# Patient Record
Sex: Female | Born: 1970
Health system: Southern US, Community
[De-identification: ages and names within clinical notes are randomized; demographics above are authoritative.]

## PROBLEM LIST (undated history)

## (undated) DIAGNOSIS — F329 Major depressive disorder, single episode, unspecified: Secondary | ICD-10-CM

## (undated) DIAGNOSIS — I10 Essential (primary) hypertension: Secondary | ICD-10-CM

## (undated) DIAGNOSIS — G43909 Migraine, unspecified, not intractable, without status migrainosus: Secondary | ICD-10-CM

## (undated) DIAGNOSIS — F32A Depression, unspecified: Secondary | ICD-10-CM

## (undated) HISTORY — DX: Major depressive disorder, single episode, unspecified: F32.9

## (undated) HISTORY — DX: Depression, unspecified: F32.A

## (undated) HISTORY — DX: Migraine, unspecified, not intractable, without status migrainosus: G43.909

## (undated) HISTORY — DX: Essential (primary) hypertension: I10

---

## 1999-04-06 ENCOUNTER — Other Ambulatory Visit: Admission: RE | Admit: 1999-04-06 | Discharge: 1999-04-06 | Payer: Self-pay | Admitting: Internal Medicine

## 2004-05-18 ENCOUNTER — Ambulatory Visit: Payer: Self-pay | Admitting: Internal Medicine

## 2004-09-04 ENCOUNTER — Ambulatory Visit: Payer: Self-pay | Admitting: Internal Medicine

## 2005-09-01 ENCOUNTER — Ambulatory Visit: Payer: Self-pay | Admitting: Internal Medicine

## 2005-10-13 ENCOUNTER — Ambulatory Visit: Payer: Self-pay | Admitting: Internal Medicine

## 2006-09-09 ENCOUNTER — Encounter (INDEPENDENT_AMBULATORY_CARE_PROVIDER_SITE_OTHER): Payer: Self-pay | Admitting: Obstetrics and Gynecology

## 2006-09-09 ENCOUNTER — Ambulatory Visit (HOSPITAL_COMMUNITY): Admission: RE | Admit: 2006-09-09 | Discharge: 2006-09-10 | Payer: Self-pay | Admitting: Obstetrics and Gynecology

## 2007-05-03 ENCOUNTER — Encounter: Admission: RE | Admit: 2007-05-03 | Discharge: 2007-05-03 | Payer: Self-pay | Admitting: Family Medicine

## 2009-02-08 HISTORY — PX: ABDOMINAL HYSTERECTOMY: SHX81

## 2010-06-23 NOTE — Op Note (Signed)
Valerie Gilmore, Valerie Gilmore                ACCOUNT NO.:  [id number]   MEDICAL RECORD NO.:  [id number]          PATIENT TYPE:  AMB   LOCATION:  DAY                          FACILITY:  [facility]   PHYSICIAN:  Crist Fat. Rivard, M.D. DATE OF BIRTH:  03-08-1970   DATE OF PROCEDURE:  09/09/2006  DATE OF DISCHARGE:                               OPERATIVE REPORT   PREOPERATIVE DIAGNOSIS:  Symptomatic uterine fibroids with menorrhagia  and anemia.   POSTOPERATIVE DIAGNOSIS:  Symptomatic uterine fibroids with menorrhagia  and anemia.   ANESTHESIA:  General.   ANESTHESIOLOGIST:  Jenelle Mages. Rica Mast, M.D.   PROCEDURE:  Robotic-assisted total hysterectomy.   SURGEON:  Crist Fat. Rivard, M.D.   ASSISTANTMarquis Lunch. Lowell Guitar, P.A.   ESTIMATED BLOOD LOSS:  50 mL.   PROCEDURE:  After being informed of the planned procedure with possible  complications including bleeding, infection, injury to bowels, bladder  or ureters, need for laparotomy, prolonged time of surgery due to  robotic approach, estimated hospital stay and estimated recovery,  informed consent is obtained.  The patient is taken to OR #10 and given  general anesthesia with endotracheal intubation.  She is placed in a  lithotomy position, prepped and draped in a sterile fashion.  She is on  a sticky mattress, shoulder pads are in place, padded twice, and her  chest is taped to the table.  Knee-high sequential compression devices  are in place.   Pelvic exam reveals an anteverted uterus, bulky, approximately 16 weeks  in size, but mobile and adnexa are normal.  A weighted speculum is  inserted, anterior lip of the cervix is grasped with a tenaculum forceps  and the uterus is sounded at 12 cm.  The cervix was then easily dilated  using Hegar dilator until #25, which allows easy entry of a #10 Rumi  intrauterine manipulator with a vaginal occluder and a 3.5-cm KOH ring.  The balloon of the Rumi is inflated and the KOH ring is sutured to the  cervix with 0 Vicryl.  A Jacobs forceps is placed on the anterior lip of  the cervix for future traction.  The weighted speculum is removed and a  Foley catheter is inserted in her bladder.   We then measure our camera port 15 cm above the fundus of the uterus,  infiltrate this area and perform a vertical 10-mm incision, which was  brought down sharply to the fascia.  The fascia is grasped with a Kocher  forceps, incised and the peritoneum is entered bluntly.  The pursestring  suture of 0 Vicryl is placed on the fascia and a 10-mm Hasson trocar is  easily inserted in the cavity and held in place by the pursestring  suture; this allows [place] to insufflated a pneumoperitoneum with CO2 at a  maximum pressure of 15 mmHg.  A camera is inserted to evaluate the  abdominopelvic cavity and we note a bulky uterus again approximately 16  weeks in size, but mobile.  The patient is status post bilateral tubal  ligation.  Both ovaries are normal.  Anterior and posterior cul-de-sac  are normal, although the bladder is adherent to the uterus almost all  the way to the fundus anteriorly.  The appendix is not visualized, but  liver and gallbladder are visualized and normal.   Trocar placement is then decided and we placed two 8-mm robotic trocars  on the left side and one 8-mm robotic trocar on the right side, as well  as a 10-mm patient-side assistant trocar on the right side, all under  direct visualization after infiltrating with Marcaine 0.25%.   The robot is then docked and a Cobra grasper is inserted in arm #3, PK  forceps in arm #2 and hot scissors in arm #1.  Console time starts at 9  o'clock for a total prepping and docking time docking time of 1 hour.   We proceed with the right side of the uterus, cauterizing the tube and  sectioning it, cauterizing the utero-ovarian ligament and sectioning it,  and cauterizing the round ligament and sectioning it; this gives [place]  gives [place] a very good entry in  the broad ligament with sharply dissecting  the posterior sheath of the broad ligament.  This is brought down all  the way to the uterosacral ligament, the ureter being easily visualized  and kept under direct visualization during this dissection.  Blunt  dissection in the right pelvic wall underneath the anterior sheath of  the broad ligament allows [place] to easily reach the KOH ring and  skeletonize the uterine artery.  The uterus was moved posteriorly and we  can now pay close attention to dissecting the bladder away from the  uterus.  The incision is made very high and we sharply and bluntly  dissect the bladder all the way down to beyond the vaginal fornix,  identified with the KOH ring; this was done fairly easily.  We now have  a direct access to the uterine artery in its ascending branch at the  junction of the vaginal fornix and this is grasped with PK forceps and  cauterized multiple times and sectioned.  We now move to the left side  and proceed in the same fashion by cauterizing the tube, sectioning it,  cauterizing the utero-ovarian ligament and sectioning it, cauterizing  the round ligament and sectioning it.  The broad ligament is then  dissected sharply, first via the posterior sheath of it, again with  keeping the ureter under direct visualization.  We can now do this time  with the anterior sheath of the broad ligament and continue dissecting  the bladder away from that side of the vaginal fornix.  The uterine  artery is then skeletonized completely and easily isolated away from the  ureter and it is grasped with the Gyrus PK forceps and cauterized and  sectioned.  The vaginal occluder is then insufflated and we can enter  the anterior vaginal fornix with monopolar scissors guided by the KOH  ring.  We can now go around in a circumferential way until the uterus is  completely detached.  The uterus is then delivered vaginally, somewhat  easily.   Instruments are then  modified for needle holders and we can close the  vaginal cuff with figure-of-eight stitch of 0 Vicryl.  We irrigate  profusely with warm saline and note a satisfactory hemostasis.  Both  ureters were easily identified with good peristalsis and no dilatation.  For peace of mind, the bladder is then filled with sterile infant  formula and we note an intact bladder with a normal integrity.  The  bladder is then drained.   Two half sheets of Intercede are placed on the vaginal vault as well as  on the broad dissection of the bladder.   The robot is undocked and the trocars are removed after evacuating the  pneumoperitoneum and the fascia of the supraumbilical incision is closed  with the previously-placed pursestring suture of 0 Vicryl.  The fascia  of the right 10-mm trocar is also closed with a figure-of-eight stitch  of 0 Vicryl.   The skin of all incisions is then closed with a subcuticular suture of 3-  0 Monocryl and Steri-Strips.   Instrument and sponge count is complete x2.  Estimated blood loss is 50  mL.  The procedure is very well tolerated by the patient, who is taken  to recovery room in a well and stable condition.   SPECIMEN:  Uterus and cervix weighing 386 g, sent to Pathology.      Crist Fat Rivard, M.D.  Electronically Signed     SAR/MEDQ  D:  09/09/2006  T:  09/10/2006  Job:  [id number]

## 2010-06-23 NOTE — H&P (Signed)
Valerie Gilmore, Valerie Gilmore                ACCOUNT NO.:  [id number]   MEDICAL RECORD NO.:  [id number]          PATIENT TYPE:  AMB   LOCATION:  DAY                          FACILITY:  [facility]   PHYSICIAN:  Valerie Gilmore, M.D. DATE OF BIRTH:  1970/07/23   DATE OF ADMISSION:  09/09/2006  DATE OF DISCHARGE:                              HISTORY & PHYSICAL   HISTORY OF PRESENT ILLNESS:  Valerie Gilmore is a 40 year old single African-  American female, para 2-0-0-2, who presents for a robot assisted  hysterectomy because of symptomatic uterine fibroids, menorrhagia, and a  history of anemia. Since the patient's bilateral tubal ligation in 1996,  she states that her 7 day menstrual flow requires her to use 2 overnight  pads every 30 minutes for 2 days, then every 2 hours for 5 days. She  denies any cramping with this bleeding but reports significant right leg  pain and significant clotting. In the fall of 2007, the patient bled for  an 11 day stretch, during which time she literally sat on the commode  for a day. For the subsequent 5 days, she changed her usual overnight  pad hourly. The remaining 6 days, she changed her pad every 2 hours. The  patient had an ultrasound done at that time by her primary care  physician, Valerie Gilmore and was found to have a uterus measuring  13.1 x 10.7 x 9.1 cm, with a 7.0 x 5.3 x 7.0 cm fibroid. The patient was  also found to be anemic and placed on iron therapy, then referred to the  gynecologist for followup. The patient reported urinary tract symptoms,  constipation (related to the iron) but denied any pelvic pain,  dyspareunia, fever, nausea, vomiting, or diarrhea. As a part of her GYN  evaluation, the patient was given options for management of her uterine  fibroid symptoms (heavy bleeding) to include double Provera, uterine  artery embolization, myomectomy, endometrial ablation, and hysterectomy.  After considering all options, the patient decided on the robot  assisted  hysterectomy. In an effort to improve her hemoglobin and decrease her  uterine size, the patient was placed on Lupron and Depo 11.25 mg on  June 02 2006. This subsequently rendered her amenorrheic after about a  month. A repeat ultrasound on August 18, 2006, showed some decrease in the  patients' uterine size with a uterus measuring 11.89 x 9.49 x 8.60 and  two measurable fibroids, 6.34 cm and 2.12 cm, respectively.   PAST MEDICAL HISTORY/OBSTETRICAL HISTORY:  Gravida 2, para 2-0-0-2. The  patient had a cesarean section in 1993 and a spontaneous vaginal birth  in 1996.   GYNECOLOGIC HISTORY:  Menarche 40 years old. Last menstrual period was  May 2008. The patient uses bilateral tubal ligation as her method of  contraception. Denies any history of sexually transmitted diseases or  abnormal pap smears. Last normal pap smear was November 2007.   PAST MEDICAL HISTORY:  Hypertension, anemia, gastroesophageal reflux  disease.   PAST SURGICAL HISTORY:  In 1996, bilateral tubal ligation. Denies any  problems with anesthesia or histories of blood  transfusion.   FAMILY HISTORY:  Hypertension, breast cancer (paternal grandmother),  diabetes mellitus, and stroke.   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 She does not use alcohol or tobacco.   MEDICATIONS:  Norvasc 10 mg daily, Protonix 40 mg daily, Citracal 500 mg  twice daily, HCTZ 25 mg daily, Repliva 1 tablet daily.   ALLERGIES:  SULFA (causes severe hypertension and hallucinations).   REVIEW OF SYSTEMS:  Denies any chest pain, shortness of breath,  headache, vision changes, dizziness, dyspareunia, hematuria, and except  as otherwise mentioned in the history of present illness, the patients'  review of systems is negative.   PHYSICAL EXAMINATION:  VITAL SIGNS:  Blood pressure 130/90, weight 193,  height 5 feet 7 inches tall.  NECK:  There are no masses, thyromegaly, or cervical  adenopathy.  HEART:  Regular rate and rhythm.  LUNGS:  Clear.  BACK:  No CVA tenderness.  ABDOMEN:  No tenderness or organomegaly. However, there is a firm mass  arising from the pelvis, approximately 3 fingerbreadths above the  synthesis pubis.  EXTREMITIES:  No clubbing, cyanosis, or edema.  PELVIC:  EG BUN within normal limits. Vagina is rugous. Cervix non-  tender without lesions. Uterus appears 14 to 16 weeks size without  tenderness. Adnexa without tenderness or masses. Rectovaginal without  tenderness or masses.   IMPRESSION:  1. Symptomatic uterine fibroids.  2. Menorrhagia.  3. History of anemia.   DISPOSITION:  A discussion was held with the patient regarding the  indications for her procedure, along with its risk, which include but  are not limited to, reaction to anesthesia, damage to adjacent organs,  infection, bleeding, and the possibility that her surgery may have to be  completed using an abdominal incision. Additionally, the patient was  advised that she will have transient postoperative facial edema, that  her procedure will be longer than the traditional open hysterectomy  procedure, and her hospital stay is expected to be 1 to 2 days, that she  should be able to return to work in 2 to 3 weeks. The patient verbalized  understanding of all of these risks.  She was given a MiraLax bowel prep  to be done 1 day prior to her procedure. She has consented to proceed  with a robot assisted hysterectomy at [facility] on  September 09, 2006 at 7:30 a.m.      Valerie Gilmore.      Valerie Gilmore, M.D.  Electronically Signed    EJP/MEDQ  D:  09/02/2006  T:  09/02/2006  Job:  [id number]

## 2010-11-23 LAB — BASIC METABOLIC PANEL
BUN: 5 — ABNORMAL LOW
CO2: 27
Chloride: 105
Creatinine, Ser: 0.68
Glucose, Bld: 117 — ABNORMAL HIGH

## 2010-11-23 LAB — CBC
MCHC: 33.9
MCV: 79
Platelets: 326
Platelets: 383
RDW: 15 — ABNORMAL HIGH
WBC: 3.8 — ABNORMAL LOW
WBC: 9.2

## 2010-11-23 LAB — URINALYSIS, ROUTINE W REFLEX MICROSCOPIC
Leukocytes, UA: NEGATIVE
Nitrite: NEGATIVE
Specific Gravity, Urine: 1.01
Urobilinogen, UA: 0.2
pH: 7.5

## 2010-11-23 LAB — URINE MICROSCOPIC-ADD ON

## 2010-11-23 LAB — PREGNANCY, URINE: Preg Test, Ur: NEGATIVE

## 2012-12-18 ENCOUNTER — Ambulatory Visit
Admission: RE | Admit: 2012-12-18 | Discharge: 2012-12-18 | Disposition: A | Discharge status: Home / Self Care | Payer: Medicaid Other | Source: Ambulatory Visit | Attending: Family Medicine | Admitting: Family Medicine

## 2012-12-18 ENCOUNTER — Other Ambulatory Visit: Payer: Self-pay | Admitting: Family Medicine

## 2012-12-18 DIAGNOSIS — M25551 Pain in right hip: Secondary | ICD-10-CM

## 2012-12-25 ENCOUNTER — Ambulatory Visit
Admission: RE | Admit: 2012-12-25 | Discharge: 2012-12-25 | Disposition: A | Discharge status: Home / Self Care | Payer: Medicaid Other | Source: Ambulatory Visit | Attending: Family Medicine | Admitting: Family Medicine

## 2013-07-25 ENCOUNTER — Other Ambulatory Visit: Payer: Self-pay | Admitting: Family Medicine

## 2013-07-25 DIAGNOSIS — M79605 Pain in left leg: Secondary | ICD-10-CM

## 2013-07-26 ENCOUNTER — Ambulatory Visit
Admission: RE | Admit: 2013-07-26 | Discharge: 2013-07-26 | Disposition: A | Discharge status: Home / Self Care | Payer: Medicaid Other | Source: Ambulatory Visit | Attending: Family Medicine | Admitting: Family Medicine

## 2013-07-26 ENCOUNTER — Other Ambulatory Visit: Payer: Self-pay | Admitting: Family Medicine

## 2013-07-26 DIAGNOSIS — M79605 Pain in left leg: Secondary | ICD-10-CM

## 2013-12-12 ENCOUNTER — Ambulatory Visit (INDEPENDENT_AMBULATORY_CARE_PROVIDER_SITE_OTHER): Payer: BC Managed Care – PPO | Admitting: Family

## 2013-12-12 ENCOUNTER — Telehealth: Payer: Self-pay | Admitting: Family

## 2013-12-12 ENCOUNTER — Encounter: Payer: Self-pay | Admitting: Family

## 2013-12-12 VITALS — BP 120/86 | Temp 98.5°F | Wt 190.4 lb

## 2013-12-12 DIAGNOSIS — G4700 Insomnia, unspecified: Secondary | ICD-10-CM

## 2013-12-12 DIAGNOSIS — Z1239 Encounter for other screening for malignant neoplasm of breast: Secondary | ICD-10-CM

## 2013-12-12 DIAGNOSIS — Z803 Family history of malignant neoplasm of breast: Secondary | ICD-10-CM

## 2013-12-12 DIAGNOSIS — I10 Essential (primary) hypertension: Secondary | ICD-10-CM

## 2013-12-12 DIAGNOSIS — Z1231 Encounter for screening mammogram for malignant neoplasm of breast: Secondary | ICD-10-CM

## 2013-12-12 NOTE — Patient Instructions (Signed)

## 2013-12-12 NOTE — Telephone Encounter (Signed)
Pt would like her daughter to be establish before end of yr

## 2013-12-13 NOTE — Telephone Encounter (Signed)
Ok per Padonda 

## 2013-12-14 ENCOUNTER — Encounter: Payer: Self-pay | Admitting: Family

## 2013-12-14 NOTE — Progress Notes (Signed)
   Subjective:    Patient ID: Valerie Gilmore, female    DOB: 08/29/1970, 43 y.o.   MRN: [id number]  HPI 43 year old African-American female, nonsmoker with a history of hypertension is in today to be established. She sees gynecology and has had a hysterectomy in the past. She has a history of breast cancer in her paternal grandmother. Has not had a mammogram in several years.   Review of Systems  Constitutional: Negative.   HENT: Negative.   Respiratory: Negative.   Cardiovascular: Negative.   Gastrointestinal: Negative.   Endocrine: Negative.   Genitourinary: Negative.   Musculoskeletal: Negative.   Skin: Negative.   Allergic/Immunologic: Negative.   Hematological: Negative.   Psychiatric/Behavioral: Negative.    Past Medical History  Diagnosis Date  . Depression   . Hypertension     high blood pressure readings     History   Social History  . Marital Status: Single    Spouse Name: N/A    Number of Children: N/A  . Years of Education: N/A   Occupational History  . Not on file.   Social History Main Topics  . Smoking status: Not on file  . Smokeless tobacco: Not on file  . Alcohol Use: Not on file  . Drug Use: Not on file  . Sexual Activity: Not on file   Other Topics Concern  . Not on file   Social History Narrative  . No narrative on file    Past Surgical History  Procedure Laterality Date  . Abdominal hysterectomy  2011    Family History  Problem Relation Age of Onset  . Breast cancer Paternal Grandmother   . Prostate cancer Father   . Hypertension      both sides     Allergies not on file  No current outpatient prescriptions on file prior to visit.   No current facility-administered medications on file prior to visit.    BP 120/86 mmHg  Temp(Src) 98.5 F (36.9 C) (Oral)  Wt 190 lb 6.4 oz (86.365 kg)chart    Objective:   Physical Exam  Constitutional: She is oriented to person, place, and time. She appears well-developed and  well-nourished.  Neck: Normal range of motion. Neck supple. No thyromegaly present.  Cardiovascular: Normal rate, regular rhythm and normal heart sounds.   Pulmonary/Chest: Effort normal and breath sounds normal.  Abdominal: Soft. Bowel sounds are normal.  Musculoskeletal: Normal range of motion.  Neurological: She is alert and oriented to person, place, and time.  Skin: Skin is warm and dry.  Psychiatric: She has a normal mood and affect.          Assessment & Plan:  Valerie Gilmore was seen today for establish care.  Diagnoses and associated orders for this visit:  Essential hypertension, benign  Insomnia  Family history of breast cancer in female - MM Digital Screening; Future  Breast cancer screening, high risk patient - MM Digital Screening; Future   call the office with any questions or concerns. Recheck in 3 months for complete physical exam fasting and sooner as needed.

## 2013-12-20 NOTE — Telephone Encounter (Signed)
Pt daughter haas been sch for 01/18/14

## 2014-01-02 ENCOUNTER — Ambulatory Visit: Payer: BC Managed Care – PPO

## 2014-01-09 ENCOUNTER — Ambulatory Visit: Payer: BC Managed Care – PPO

## 2014-01-29 ENCOUNTER — Ambulatory Visit (INDEPENDENT_AMBULATORY_CARE_PROVIDER_SITE_OTHER): Payer: BC Managed Care – PPO | Admitting: Family

## 2014-01-29 ENCOUNTER — Encounter: Payer: Self-pay | Admitting: Family

## 2014-01-29 VITALS — BP 128/90 | HR 101 | Temp 102.0°F | Wt 190.0 lb

## 2014-01-29 DIAGNOSIS — R6889 Other general symptoms and signs: Secondary | ICD-10-CM

## 2014-01-29 DIAGNOSIS — J020 Streptococcal pharyngitis: Secondary | ICD-10-CM

## 2014-01-29 DIAGNOSIS — R300 Dysuria: Secondary | ICD-10-CM

## 2014-01-29 LAB — POCT URINALYSIS DIPSTICK
Bilirubin, UA: NEGATIVE
Glucose, UA: NEGATIVE
KETONES UA: NEGATIVE
LEUKOCYTES UA: NEGATIVE
Nitrite, UA: NEGATIVE
PH UA: 6.5
Spec Grav, UA: 1.02
UROBILINOGEN UA: 0.2

## 2014-01-29 LAB — POCT INFLUENZA A/B
INFLUENZA A, POC: NEGATIVE
INFLUENZA B, POC: NEGATIVE

## 2014-01-29 LAB — POCT RAPID STREP A (OFFICE): Rapid Strep A Screen: POSITIVE — AB

## 2014-01-29 MED ORDER — CEFTRIAXONE SODIUM 1 G IJ SOLR: 1.0000 g | Freq: Once | INTRAMUSCULAR | Status: DC

## 2014-01-29 MED ORDER — AMOXICILLIN 500 MG PO TABS: 1000.0000 mg | ORAL_TABLET | Freq: Two times a day (BID) | ORAL | Status: AC

## 2014-01-29 MED ORDER — CEFTRIAXONE SODIUM 1 G IJ SOLR
1.0000 g | Freq: Once | INTRAMUSCULAR | Status: AC
  Administered 2014-01-29: 1 g via INTRAMUSCULAR

## 2014-01-29 NOTE — Progress Notes (Signed)
Pre visit review using our clinic review tool, if applicable. No additional management support is needed unless otherwise documented below in the visit note. 

## 2014-01-29 NOTE — Patient Instructions (Signed)

## 2014-01-29 NOTE — Progress Notes (Signed)
Subjective:    Patient ID: Valerie Gilmore, female    DOB: 07/10/1970, 43 y.o.   MRN: [id number]  HPI 43 year old African-American female, nonsmoker is in today with complaints of fever, chills, myalgias 1 day and worsening. Has not taken any medication over-the-counter. Did not get a flu shot this year. Also reports pelvic cramping. Denies any vaginal discharge or odor. No frequency or urgency to urinate.   Review of Systems  Constitutional: Positive for fever and chills.  HENT: Positive for congestion and sore throat.   Respiratory: Positive for cough.   Cardiovascular: Negative.   Gastrointestinal: Negative.   Endocrine: Negative.   Genitourinary: Positive for pelvic pain. Negative for dysuria, hematuria, vaginal bleeding, vaginal discharge and vaginal pain.  Musculoskeletal: Positive for myalgias.  Skin: Negative.   Allergic/Immunologic: Negative.   Neurological: Negative.   Psychiatric/Behavioral: Negative.    Past Medical History  Diagnosis Date  . Depression   . Hypertension     high blood pressure readings     History   Social History  . Marital Status: Single    Spouse Name: N/A    Number of Children: N/A  . Years of Education: N/A   Occupational History  . Not on file.   Social History Main Topics  . Smoking status: Not on file  . Smokeless tobacco: Not on file  . Alcohol Use: Not on file  . Drug Use: Not on file  . Sexual Activity: Not on file   Other Topics Concern  . Not on file   Social History Narrative    Past Surgical History  Procedure Laterality Date  . Abdominal hysterectomy  2011    Family History  Problem Relation Age of Onset  . Breast cancer Paternal Grandmother   . Prostate cancer Father   . Hypertension      both sides     No Known Allergies  Current Outpatient Prescriptions on File Prior to Visit  Medication Sig Dispense Refill  . amLODipine (NORVASC) 10 MG tablet Take 10 mg by mouth daily.   3  . cloNIDine (CATAPRES)  0.2 MG tablet Take 0.2 mg by mouth daily.    . hydrochlorothiazide (HYDRODIURIL) 25 MG tablet Take 25 mg by mouth daily.   2  . losartan (COZAAR) 100 MG tablet Take 100 mg by mouth daily.   3   No current facility-administered medications on file prior to visit.    BP 128/90 mmHg  Pulse 101  Temp(Src) 102 F (38.9 C) (Oral)  Wt 190 lb (86.183 kg)chart    Objective:   Physical Exam  Constitutional: She is oriented to person, place, and time. She appears well-developed and well-nourished.  HENT:  Right Ear: External ear normal.  Left Ear: External ear normal.  Nose: Nose normal.  Tonsils 2-3+. Moderate exudate.  Neck: Normal range of motion. Neck supple.  Cardiovascular: Normal rate, regular rhythm and normal heart sounds.   Pulmonary/Chest: Effort normal and breath sounds normal.  Abdominal: Soft. Bowel sounds are normal.  Musculoskeletal: Normal range of motion.  Lymphadenopathy:    She has cervical adenopathy.  Neurological: She is alert and oriented to person, place, and time.  Skin: Skin is warm and dry.  Psychiatric: She has a normal mood and affect.          Assessment & Plan:  Valerie Gilmore was seen today for generalized body aches, fever, sore throat and dysuria.  Diagnoses and associated orders for this visit:  Strep pharyngitis  Flu-like symptoms -  POC Influenza A/B  Dysuria - POCT Urine Dip  Other Orders - cefTRIAXone (ROCEPHIN) 1 G injection; Inject 1 g into the muscle once. - amoxicillin (AMOXIL) 500 MG tablet; Take 2 tablets (1,000 mg total) by mouth 2 (two) times daily.   Alternate ibuprofen and Tylenol. Call the office with any questions or concerns. Change out toothbrush. Recheck as needed.

## 2014-02-12 ENCOUNTER — Encounter: Payer: BC Managed Care – PPO | Admitting: Family

## 2014-05-08 ENCOUNTER — Telehealth: Payer: Self-pay | Admitting: Family

## 2014-05-08 ENCOUNTER — Ambulatory Visit (INDEPENDENT_AMBULATORY_CARE_PROVIDER_SITE_OTHER): Payer: 59 | Admitting: Family

## 2014-05-08 VITALS — BP 150/120 | HR 73 | Temp 98.3°F | Wt 189.7 lb

## 2014-05-08 DIAGNOSIS — F411 Generalized anxiety disorder: Secondary | ICD-10-CM

## 2014-05-08 DIAGNOSIS — F329 Major depressive disorder, single episode, unspecified: Secondary | ICD-10-CM

## 2014-05-08 DIAGNOSIS — I10 Essential (primary) hypertension: Secondary | ICD-10-CM

## 2014-05-08 DIAGNOSIS — F32A Depression, unspecified: Secondary | ICD-10-CM

## 2014-05-08 LAB — COMPREHENSIVE METABOLIC PANEL
ALT: 26 U/L (ref 0–35)
AST: 24 U/L (ref 0–37)
Albumin: 4.4 g/dL (ref 3.5–5.2)
Alkaline Phosphatase: 43 U/L (ref 39–117)
BILIRUBIN TOTAL: 0.4 mg/dL (ref 0.2–1.2)
BUN: 7 mg/dL (ref 6–23)
CHLORIDE: 105 meq/L (ref 96–112)
CO2: 30 meq/L (ref 19–32)
Calcium: 9.5 mg/dL (ref 8.4–10.5)
Creatinine, Ser: 0.77 mg/dL (ref 0.40–1.20)
GFR: 106.08 mL/min (ref >60.00)
GLUCOSE: 83 mg/dL (ref 70–99)
POTASSIUM: 4.1 meq/L (ref 3.5–5.1)
SODIUM: 137 meq/L (ref 135–145)
Total Protein: 7.9 g/dL (ref 6.0–8.3)

## 2014-05-08 MED ORDER — OLMESARTAN-AMLODIPINE-HCTZ 40-5-12.5 MG PO TABS: 1.0000 | ORAL_TABLET | Freq: Every day | ORAL | Status: DC

## 2014-05-08 MED ORDER — BUPROPION HCL ER (XL) 150 MG PO TB24: 150.0000 mg | ORAL_TABLET | Freq: Every day | ORAL | Status: DC

## 2014-05-08 NOTE — Progress Notes (Signed)
Pre visit review using our clinic review tool, if applicable. No additional management support is needed unless otherwise documented below in the visit note. 

## 2014-05-08 NOTE — Patient Instructions (Signed)

## 2014-05-08 NOTE — Progress Notes (Signed)
Subjective:    Patient ID: Valerie Gilmore, female    DOB: 22-Apr-1970, 44 y.o.   MRN: [id number]  HPI 44 y.o. AA female presents today with chief complaint of headache, stress and anxiety. 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 States that she has a headache and right side weakness that she thought was due to her stress, however, she sees that her blood pressure is very high. Pt states that she takes all of her blood pressure medicines daily, except the "fluid pill" because she believes it makes her swell. Pt denies heart palpitations, SOB, chest pain. Denies episodes of crying, fatigue, difficulty waking up in the morning. Denies chills fever and change in appetite.   Past Medical History  Diagnosis Date  . Depression   . Hypertension     high blood pressure readings     History   Social History  . Marital Status: Single    Spouse Name: N/A  . Number of Children: N/A  . Years of Education: N/A   Occupational History  . Not on file.   Social History Main Topics  . Smoking status: Not on file  . Smokeless tobacco: Not on file  . Alcohol Use: Not on file  . Drug Use: Not on file  . Sexual Activity: Not on file   Other Topics Concern  . Not on file   Social History Narrative    Past Surgical History  Procedure Laterality Date  . Abdominal hysterectomy  2011    Family History  Problem Relation Age of Onset  . Breast cancer Paternal Grandmother   . Prostate cancer Father   . Hypertension      both sides     No Known Allergies  Current Outpatient Prescriptions on File Prior to Visit  Medication Sig Dispense Refill  . amLODipine (NORVASC) 10 MG tablet Take 10 mg by mouth daily.   3  . hydrochlorothiazide (HYDRODIURIL) 25 MG tablet Take 25 mg by mouth daily.   2  . losartan (COZAAR) 100 MG tablet Take 100 mg by mouth daily.   3   No current facility-administered medications on file prior to visit.    BP 150/120 mmHg   Pulse 73  Temp(Src) 98.3 F (36.8 C) (Oral)  Wt 189 lb 11.2 oz (86.047 kg)chart  Review of Systems  Constitutional: Negative for fever, chills, activity change, appetite change and fatigue.  Respiratory: Negative.  Negative for cough, chest tightness and shortness of breath.   Cardiovascular: Negative for chest pain.       Acknowledges swelling in bilateral lower extremities.   Neurological: Positive for weakness and headaches. Negative for dizziness and light-headedness.       Acknowledges right side weakness to upper extremity "occasionally".   Psychiatric/Behavioral: The patient is nervous/anxious.        Objective:   Physical Exam  Constitutional: She is oriented to person, place, and time. She appears well-developed and well-nourished. She is active.  Cardiovascular: Normal rate, regular rhythm, normal heart sounds and normal pulses.   Pulmonary/Chest: Effort normal and breath sounds normal.  Abdominal: Soft. Normal appearance and bowel sounds are normal.  Neurological: She is alert and oriented to person, place, and time. She has normal strength and normal reflexes.  Psychiatric: Her speech is normal and behavior is normal. Thought content normal. Her mood appears anxious. She exhibits a depressed mood.          Assessment & Plan:  Valerie Gilmore was seen today for follow-up.  Diagnoses and all orders for this visit:  Essential hypertension Orders: -     CMP  Generalized anxiety disorder  Depression  Other orders -     Olmesartan-Amlodipine-HCTZ (TRIBENZOR) 40-5-12.5 MG TABS; Take 1 tablet by mouth daily. -     buPROPion (WELLBUTRIN XL) 150 MG 24 hr tablet; Take 1 tablet (150 mg total) by mouth daily.  Follow up in four weeks for med recheck and blood pressure recheck.

## 2014-05-08 NOTE — Telephone Encounter (Signed)
emmi emailed °

## 2014-05-09 ENCOUNTER — Telehealth: Payer: Self-pay | Admitting: Family

## 2014-05-09 MED ORDER — OLMESARTAN MEDOXOMIL-HCTZ 40-25 MG PO TABS: 1.0000 | ORAL_TABLET | Freq: Every day | ORAL | Status: DC

## 2014-05-09 MED ORDER — AMLODIPINE BESYLATE 5 MG PO TABS: 5.0000 mg | ORAL_TABLET | Freq: Every day | ORAL | Status: DC

## 2014-05-09 NOTE — Telephone Encounter (Signed)
Pt would like a cb. Pt aware insurance co has been contacted and will await your return call..Marland Kitchen

## 2014-05-09 NOTE — Telephone Encounter (Signed)
Left message to advise pt to call insurance company and ask what they will cover.

## 2014-05-09 NOTE — Telephone Encounter (Signed)
Patient states Olmesartan-Amlodipine-HCTZ (TRIBENZOR) 40-5-12.5 MG TABS will cost her $160 per month and with the discount card it will be $90 per month, it is a Tier 4 medication.  Patient would like to know if she can get another RX.  She said she will be out of her current medication tomorrow.  Her BP this morning when she woke up was 132/107.  [facility] that I will give you to review.

## 2014-05-09 NOTE — Telephone Encounter (Signed)
Per Padonda, Benicar HCT 40/25mg  and amlodipine 5mg .  Scripts sent with note to d/c all other scripts for amlodipine, losartan, hctz, and tribenzor

## 2014-05-09 NOTE — Telephone Encounter (Signed)
I submitted a Tier Exception request for medication. [id number]  I am waiting on a fax back from insurance. Patient is aware of samples and will come pick them up.   FYI: Insurance gave an alternative of Benicar HCT with amlodipine.

## 2014-05-09 NOTE — Telephone Encounter (Signed)
Pt aware and noted that she [place] from [place] letting her know that the Tribenzor was approved for tier 3. Pt will aks pharmacy if Tribenzor is cost effective and if it is not, she will take the Benicar HCT and amlodipine. She call back to let me know

## 2014-05-09 NOTE — Telephone Encounter (Signed)
[employer] submitted the PA and that was approved.  I had to callback and ask again for a Tier Exception request. [id number].  Patient is aware and will wait until we know what the Tier request outcome is.  She is scheduled for ROV on 06/05/14.

## 2014-05-10 NOTE — Telephone Encounter (Signed)
Thanks

## 2014-05-10 NOTE — Telephone Encounter (Signed)
Patient called back and is aware.

## 2014-05-10 NOTE — Telephone Encounter (Signed)
Tier Exception was denied.  LMOM for patient advising her of the out-come.

## 2014-06-05 ENCOUNTER — Ambulatory Visit: Payer: 59 | Admitting: Family

## 2014-06-14 ENCOUNTER — Ambulatory Visit (INDEPENDENT_AMBULATORY_CARE_PROVIDER_SITE_OTHER): Payer: 59 | Admitting: Family

## 2014-06-14 ENCOUNTER — Encounter: Payer: Self-pay | Admitting: Family

## 2014-06-14 VITALS — BP 122/100 | HR 68 | Temp 98.1°F | Wt 188.0 lb

## 2014-06-14 DIAGNOSIS — I10 Essential (primary) hypertension: Secondary | ICD-10-CM

## 2014-06-14 NOTE — Progress Notes (Signed)
   Subjective:    Patient ID: Valerie Gilmore, female    DOB: 09/15/1970, 44 y.o.   MRN: [id number]  HPI 44 year old African-American female, nonsmoker with a long-standing history of uncontrolled hypertension is in today for recheck. She's currently on Tribenzor 40/5/12.5 and tolerating it well. Continues to have diastolic blood pressure readings 90-100. Family history significant for hypertension. Works out 4 times per week. He's a low-sodium diet.   Review of Systems  Constitutional: Negative.   HENT: Negative.   Respiratory: Negative.   Cardiovascular: Negative.   Gastrointestinal: Negative.   Endocrine: Negative.   Genitourinary: Negative.   Musculoskeletal: Negative.   Allergic/Immunologic: Negative.   Neurological: Negative.   Psychiatric/Behavioral: Negative.    Past Medical History  Diagnosis Date  . Depression   . Hypertension     high blood pressure readings     History   Social History  . Marital Status: Single    Spouse Name: N/A  . Number of Children: N/A  . Years of Education: N/A   Occupational History  . Not on file.   Social History Main Topics  . Smoking status: Never Smoker   . Smokeless tobacco: Not on file  . Alcohol Use: No  . Drug Use: No  . Sexual Activity: Not on file   Other Topics Concern  . Not on file   Social History Narrative    Past Surgical History  Procedure Laterality Date  . Abdominal hysterectomy  2011    Family History  Problem Relation Age of Onset  . Breast cancer Paternal Grandmother   . Prostate cancer Father   . Hypertension      both sides     No Known Allergies  Current Outpatient Prescriptions on File Prior to Visit  Medication Sig Dispense Refill  . amLODipine (NORVASC) 5 MG tablet Take 1 tablet (5 mg total) by mouth daily. 90 tablet 0  . buPROPion (WELLBUTRIN XL) 150 MG 24 hr tablet Take 1 tablet (150 mg total) by mouth daily. 30 tablet 1  . olmesartan-hydrochlorothiazide (BENICAR HCT) 40-25 MG per  tablet Take 1 tablet by mouth daily. 90 tablet 0   No current facility-administered medications on file prior to visit.    BP 122/100 mmHg  Pulse 68  Temp(Src) 98.1 F (36.7 C) (Oral)  Wt 188 lb (85.276 kg)chart    Objective:   Physical Exam  Constitutional: She is oriented to person, place, and time. She appears well-developed and well-nourished.  Neck: Normal range of motion. Neck supple.  Cardiovascular: Normal rate, regular rhythm and normal heart sounds.   Pulmonary/Chest: Effort normal and breath sounds normal.  Abdominal: Soft. Bowel sounds are normal.  Musculoskeletal: Normal range of motion.  Neurological: She is alert and oriented to person, place, and time.  Skin: Skin is warm and dry.  Psychiatric: She has a normal mood and affect.          Assessment & Plan:  Valerie Gilmore was seen today for follow-up.  Diagnoses and all orders for this visit:  Uncontrolled hypertension   Advised to start medication Benicar HCT 40/25 and amlodipine 5 mg once daily. We'll see where her blood pressure looks like in 2-3 weeks then make a decision on whether or not we need to add a beta blocker to her current therapy.

## 2014-06-14 NOTE — Progress Notes (Signed)
Pre visit review using our clinic review tool, if applicable. No additional management support is needed unless otherwise documented below in the visit note. 

## 2014-06-14 NOTE — Patient Instructions (Signed)
Fat and Cholesterol Control Diet Fat and cholesterol levels in your blood and organs are influenced by your diet. High levels of fat and cholesterol may lead to diseases of the heart, small and large blood vessels, gallbladder, liver, and pancreas. CONTROLLING FAT AND CHOLESTEROL WITH DIET Although exercise and lifestyle factors are important, your diet is key. That is because certain foods are known to raise cholesterol and others to lower it. The goal is to balance foods for their effect on cholesterol and more importantly, to replace saturated and trans fat with other types of fat, such as monounsaturated fat, polyunsaturated fat, and omega-3 fatty acids. On average, a person should consume no more than 15 to 17 g of saturated fat daily. Saturated and trans fats are considered "bad" fats, and they will raise LDL cholesterol. Saturated fats are primarily found in animal products such as meats, butter, and cream. However, that does not mean you need to give up all your favorite foods. Today, there are good tasting, low-fat, low-cholesterol substitutes for most of the things you like to eat. Choose low-fat or nonfat alternatives. Choose round or loin cuts of red meat. These types of cuts are lowest in fat and cholesterol. Chicken (without the skin), fish, veal, and ground turkey breast are great choices. Eliminate fatty meats, such as hot dogs and salami. Even shellfish have little or no saturated fat. Have a 3 oz (85 g) portion when you eat lean meat, poultry, or fish. Trans fats are also called "partially hydrogenated oils." They are oils that have been scientifically manipulated so that they are solid at room temperature resulting in a longer shelf life and improved taste and texture of foods in which they are added. Trans fats are found in stick margarine, some tub margarines, cookies, crackers, and baked goods.  When baking and cooking, oils are a great substitute for butter. The monounsaturated oils are  especially beneficial since it is believed they lower LDL and raise HDL. The oils you should avoid entirely are saturated tropical oils, such as coconut and palm.  Remember to eat a lot from food groups that are naturally free of saturated and trans fat, including fish, fruit, vegetables, beans, grains (barley, rice, couscous, bulgur wheat), and pasta (without cream sauces).  IDENTIFYING FOODS THAT LOWER FAT AND CHOLESTEROL  Soluble fiber may lower your cholesterol. This type of fiber is found in fruits such as apples, vegetables such as broccoli, potatoes, and carrots, legumes such as beans, peas, and lentils, and grains such as barley. Foods fortified with plant sterols (phytosterol) may also lower cholesterol. You should eat at least 2 g per day of these foods for a cholesterol lowering effect.  Read package labels to identify low-saturated fats, trans fat free, and low-fat foods at the supermarket. Select cheeses that have only 2 to 3 g saturated fat per ounce. Use a heart-healthy tub margarine that is free of trans fats or partially hydrogenated oil. When buying baked goods (cookies, crackers), avoid partially hydrogenated oils. Breads and muffins should be made from whole grains (whole-wheat or whole oat flour, instead of "flour" or "enriched flour"). Buy non-creamy canned soups with reduced salt and no added fats.  FOOD PREPARATION TECHNIQUES  Never deep-fry. If you must fry, either stir-fry, which uses very little fat, or use non-stick cooking sprays. When possible, broil, bake, or roast meats, and steam vegetables. Instead of putting butter or margarine on vegetables, use lemon and herbs, applesauce, and cinnamon (for squash and sweet potatoes). Use nonfat   yogurt, salsa, and low-fat dressings for salads.  LOW-SATURATED FAT / LOW-FAT FOOD SUBSTITUTES Meats / Saturated Fat (g)  Avoid: Steak, marbled (3 oz/85 g) / 11 g  Choose: Steak, lean (3 oz/85 g) / 4 g  Avoid: Hamburger (3 oz/85 g) / 7  g  Choose: Hamburger, lean (3 oz/85 g) / 5 g  Avoid: Ham (3 oz/85 g) / 6 g  Choose: Ham, lean cut (3 oz/85 g) / 2.4 g  Avoid: Chicken, with skin, dark meat (3 oz/85 g) / 4 g  Choose: Chicken, skin removed, dark meat (3 oz/85 g) / 2 g  Avoid: Chicken, with skin, light meat (3 oz/85 g) / 2.5 g  Choose: Chicken, skin removed, light meat (3 oz/85 g) / 1 g Dairy / Saturated Fat (g)  Avoid: Whole milk (1 cup) / 5 g  Choose: Low-fat milk, 2% (1 cup) / 3 g  Choose: Low-fat milk, 1% (1 cup) / 1.5 g  Choose: Skim milk (1 cup) / 0.3 g  Avoid: Hard cheese (1 oz/28 g) / 6 g  Choose: Skim milk cheese (1 oz/28 g) / 2 to 3 g  Avoid: Cottage cheese, 4% fat (1 cup) / 6.5 g  Choose: Low-fat cottage cheese, 1% fat (1 cup) / 1.5 g  Avoid: Ice cream (1 cup) / 9 g  Choose: Sherbet (1 cup) / 2.5 g  Choose: Nonfat frozen yogurt (1 cup) / 0.3 g  Choose: Frozen fruit bar / trace  Avoid: Whipped cream (1 tbs) / 3.5 g  Choose: Nondairy whipped topping (1 tbs) / 1 g Condiments / Saturated Fat (g)  Avoid: Mayonnaise (1 tbs) / 2 g  Choose: Low-fat mayonnaise (1 tbs) / 1 g  Avoid: Butter (1 tbs) / 7 g  Choose: Extra light margarine (1 tbs) / 1 g  Avoid: Coconut oil (1 tbs) / 11.8 g  Choose: Olive oil (1 tbs) / 1.8 g  Choose: Corn oil (1 tbs) / 1.7 g  Choose: Safflower oil (1 tbs) / 1.2 g  Choose: Sunflower oil (1 tbs) / 1.4 g  Choose: Soybean oil (1 tbs) / 2.4 g  Choose: Canola oil (1 tbs) / 1 g Document Released: 01/25/2005 Document Revised: 05/22/2012 Document Reviewed: 04/25/2013 ExitCare Patient Information 2015 [employer]. This information is not intended to replace advice given to you by your health care provider. Make sure you discuss any questions you have with your health care provider.  

## 2014-08-16 ENCOUNTER — Ambulatory Visit (INDEPENDENT_AMBULATORY_CARE_PROVIDER_SITE_OTHER): Payer: 59 | Admitting: Family

## 2014-08-16 ENCOUNTER — Encounter: Payer: Self-pay | Admitting: Family

## 2014-08-16 VITALS — BP 140/94 | Temp 98.9°F | Wt 191.0 lb

## 2014-08-16 DIAGNOSIS — G5602 Carpal tunnel syndrome, left upper limb: Secondary | ICD-10-CM

## 2014-08-16 DIAGNOSIS — G5601 Carpal tunnel syndrome, right upper limb: Secondary | ICD-10-CM

## 2014-08-16 DIAGNOSIS — G5603 Carpal tunnel syndrome, bilateral upper limbs: Secondary | ICD-10-CM

## 2014-08-16 DIAGNOSIS — M79641 Pain in right hand: Secondary | ICD-10-CM

## 2014-08-16 DIAGNOSIS — M79642 Pain in left hand: Secondary | ICD-10-CM

## 2014-08-16 MED ORDER — PREDNISONE 20 MG PO TABS: 60.0000 mg | ORAL_TABLET | Freq: Every day | ORAL | Status: AC

## 2014-08-16 MED ORDER — HYDROCODONE-ACETAMINOPHEN 10-325 MG PO TABS: 1.0000 | ORAL_TABLET | Freq: Three times a day (TID) | ORAL | Status: DC | PRN

## 2014-08-16 MED ORDER — KETOROLAC TROMETHAMINE 60 MG/2ML IM SOLN
60.0000 mg | Freq: Once | INTRAMUSCULAR | Status: AC
  Administered 2014-08-16: 60 mg via INTRAMUSCULAR

## 2014-08-16 MED ORDER — KETOROLAC TROMETHAMINE 60 MG/2ML IM SOLN: 60.0000 mg | Freq: Once | INTRAMUSCULAR | Status: DC

## 2014-08-16 NOTE — Progress Notes (Signed)
Subjective:    Patient ID: Valerie Gilmore, female    DOB: 05-22-1970, 44 y.o.   MRN: [id number]  HPI 44 year old African-American female, nonsmoker with a history of hypertension is in today with complaints of bilateral hand swelling, pain and numbness 3 weeks that has worsened over the last several days. Has been taken bareback and body and Aleve without much relief. Has a history of carpal tunnel syndrome. 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   Review of Systems  Constitutional: Negative.   Respiratory: Negative.   Cardiovascular: Negative.   Gastrointestinal: Negative.   Endocrine: Negative.   Genitourinary: Negative.   Musculoskeletal: Positive for arthralgias.       Bilateral hand pain   Skin: Negative.   Allergic/Immunologic: Negative.   Neurological: Positive for numbness.       Tingling in hands   Psychiatric/Behavioral: Negative.    Past Medical History  Diagnosis Date  . Depression   . Hypertension     high blood pressure readings     History   Social History  . Marital Status: Single    Spouse Name: N/A  . Number of Children: N/A  . Years of Education: N/A   Occupational History  . Not on file.   Social History Main Topics  . Smoking status: Never Smoker   . Smokeless tobacco: Not on file  . Alcohol Use: No  . Drug Use: No  . Sexual Activity: Not on file   Other Topics Concern  . Not on file   Social History Narrative    Past Surgical History  Procedure Laterality Date  . Abdominal hysterectomy  2011    Family History  Problem Relation Age of Onset  . Breast cancer Paternal Grandmother   . Prostate cancer Father   . Hypertension      both sides     No Known Allergies  Current Outpatient Prescriptions on File Prior to Visit  Medication Sig Dispense Refill  . amLODipine (NORVASC) 5 MG tablet Take 1 tablet (5 mg total) by mouth daily. 90 tablet 0  . buPROPion (WELLBUTRIN XL) 150 MG 24 hr tablet Take 1 tablet (150 mg  total) by mouth daily. 30 tablet 1  . olmesartan-hydrochlorothiazide (BENICAR HCT) 40-25 MG per tablet Take 1 tablet by mouth daily. 90 tablet 0   No current facility-administered medications on file prior to visit.    BP 140/94 mmHg  Temp(Src) 98.9 F (37.2 C) (Oral)  Wt 191 lb (86.637 kg)chart    Objective:   Physical Exam  Constitutional: She is oriented to person, place, and time. She appears well-developed and well-nourished.  Neck: Normal range of motion. Neck supple.  Cardiovascular: Normal rate, regular rhythm and normal heart sounds.   Pulmonary/Chest: Effort normal and breath sounds normal.  Abdominal: Soft. Bowel sounds are normal.  Neurological: She is alert and oriented to person, place, and time.  Positive phalen and tinel sign.   Skin: Skin is warm and dry.  Psychiatric: She has a normal mood and affect.          Assessment & Plan:  Valerie Gilmore was seen today for hand pain.  Diagnoses and all orders for this visit:  Bilateral carpal tunnel syndrome Orders: -     ketorolac (TORADOL) injection 60 mg; Inject 2 mLs (60 mg total) into the muscle once. -     ketorolac (TORADOL) injection 60 mg; Inject 2 mLs (60 mg total) into the muscle once.  Bilateral hand pain  Other orders -     predniSONE (DELTASONE) 20 MG tablet; Take 3 tablets (60 mg total) by mouth daily. -     HYDROcodone-acetaminophen (NORCO) 10-325 MG per tablet; Take 1 tablet by mouth every 8 (eight) hours as needed.   Call the office with any questions or concerns. Consider referral to neurology for nerve conduction study if symptoms do not improve. Wrist splints at night.

## 2014-08-16 NOTE — Patient Instructions (Signed)
Carpal Tunnel Syndrome The carpal tunnel is a narrow area located on the palm side of your wrist. The tunnel is formed by the wrist bones and ligaments. Nerves, blood vessels, and tendons pass through the carpal tunnel. Repeated wrist motion or certain diseases may cause swelling within the tunnel. This swelling pinches the main nerve in the wrist (median nerve) and causes the painful hand and arm condition called carpal tunnel syndrome. CAUSES   Repeated wrist motions.  Wrist injuries.  Certain diseases like arthritis, diabetes, alcoholism, hyperthyroidism, and kidney failure.  Obesity.  Pregnancy. SYMPTOMS   A "pins and needles" feeling in your fingers or hand, especially in your thumb, index and middle fingers.  Tingling or numbness in your fingers or hand.  An aching feeling in your entire arm, especially when your wrist and elbow are bent for long periods of time.  Wrist pain that goes up your arm to your shoulder.  Pain that goes down into your palm or fingers.  A weak feeling in your hands. DIAGNOSIS  Your health care provider will take your history and perform a physical exam. An electromyography test may be needed. This test measures electrical signals sent out by your nerves into the muscles. The electrical signals are usually slowed by carpal tunnel syndrome. You may also need X-rays. TREATMENT  Carpal tunnel syndrome may clear up by itself. Your health care provider may recommend a wrist splint or medicine such as a nonsteroidal anti-inflammatory medicine. Cortisone injections may help. Sometimes, surgery may be needed to free the pinched nerve.  HOME CARE INSTRUCTIONS   Take all medicine as directed by your health care provider. Only take over-the-counter or prescription medicines for pain, discomfort, or fever as directed by your health care provider.  If you were given a splint to keep your wrist from bending, wear it as directed. It is important to wear the splint at  night. Wear the splint for as long as you have pain or numbness in your hand, arm, or wrist. This may take 1 to 2 months.  Rest your wrist from any activity that may be causing your pain. If your symptoms are work-related, you may need to talk to your employer about changing to a job that does not require using your wrist.  Put ice on your wrist after long periods of wrist activity.  Put ice in a plastic bag.  Place a towel between your skin and the bag.  Leave the ice on for 15-20 minutes, 03-04 times a day.  Keep all follow-up visits as directed by your health care provider. This includes any orthopedic referrals, physical therapy, and rehabilitation. Any delay in getting necessary care could result in a delay or failure of your condition to heal. SEEK IMMEDIATE MEDICAL CARE IF:   You have new, unexplained symptoms.  Your symptoms get worse and are not helped or controlled with medicines. MAKE SURE YOU:   Understand these instructions.  Will watch your condition.  Will get help right away if you are not doing well or get worse. Document Released: 01/23/2000 Document Revised: 06/11/2013 Document Reviewed: 12/11/2010 ExitCare Patient Information 2015 [employer]. This information is not intended to replace advice given to you by your health care provider. Make sure you discuss any questions you have with your health care provider.  

## 2014-08-16 NOTE — Progress Notes (Signed)
Pre visit review using our clinic review tool, if applicable. No additional management support is needed unless otherwise documented below in the visit note. 

## 2014-09-20 ENCOUNTER — Other Ambulatory Visit: Payer: Self-pay | Admitting: Family

## 2014-11-08 ENCOUNTER — Telehealth: Payer: Self-pay | Admitting: Family

## 2014-11-08 DIAGNOSIS — L309 Dermatitis, unspecified: Secondary | ICD-10-CM

## 2014-11-08 NOTE — Telephone Encounter (Addendum)
Pt needs a referral to see dr Terri Piedra for ?mole on neck, ?ringworm on back of her neck and dermatitis on face`. Pt has seen dr Terri Piedra in past. Dr Terri Piedra office needs a referral sent to their office.

## 2014-11-11 NOTE — Telephone Encounter (Signed)
Referral approved per [facility]. Referral ordered.

## 2014-12-17 ENCOUNTER — Telehealth: Payer: Self-pay | Admitting: Family

## 2014-12-17 MED ORDER — OLMESARTAN MEDOXOMIL-HCTZ 40-25 MG PO TABS: ORAL_TABLET | ORAL | Status: DC

## 2014-12-17 NOTE — Telephone Encounter (Signed)
Rx was sent to the Pharmacy  

## 2014-12-17 NOTE — Telephone Encounter (Signed)
Pt request refill of the following: BENICAR HCT 40-25 MG per tablet, hydrochlorothiazide   Phamacy: [facility] [address]

## 2014-12-26 ENCOUNTER — Other Ambulatory Visit: Payer: Self-pay | Admitting: Family

## 2015-01-08 ENCOUNTER — Ambulatory Visit: Payer: 59 | Admitting: Adult Health

## 2015-01-08 ENCOUNTER — Ambulatory Visit: Payer: 59 | Admitting: Family

## 2015-01-20 ENCOUNTER — Encounter: Payer: Self-pay | Admitting: Adult Health

## 2015-01-20 ENCOUNTER — Ambulatory Visit (INDEPENDENT_AMBULATORY_CARE_PROVIDER_SITE_OTHER): Payer: 59 | Admitting: Adult Health

## 2015-01-20 VITALS — BP 138/100 | Temp 98.7°F | Wt 189.2 lb

## 2015-01-20 DIAGNOSIS — G5603 Carpal tunnel syndrome, bilateral upper limbs: Secondary | ICD-10-CM

## 2015-01-20 DIAGNOSIS — I10 Essential (primary) hypertension: Secondary | ICD-10-CM | POA: Diagnosis not present

## 2015-01-20 DIAGNOSIS — J0100 Acute maxillary sinusitis, unspecified: Secondary | ICD-10-CM | POA: Diagnosis not present

## 2015-01-20 MED ORDER — OLMESARTAN MEDOXOMIL-HCTZ 40-25 MG PO TABS: ORAL_TABLET | ORAL | Status: DC

## 2015-01-20 MED ORDER — AMLODIPINE BESYLATE 10 MG PO TABS: 10.0000 mg | ORAL_TABLET | Freq: Every day | ORAL | Status: DC

## 2015-01-20 NOTE — Progress Notes (Signed)
Pre visit review using our clinic review tool, if applicable. No additional management support is needed unless otherwise documented below in the visit note. 

## 2015-01-20 NOTE — Patient Instructions (Signed)
It was great meeting you today!  I have increased your Norvasc from 5 mg to 10 mg. Take this once a day. Monitor your blood pressure at home and write the results in a log, bring this log to your next visit.   Someone will call you to schedule an appointment for Neurology   Use Sudafed for the next two days as well as Flonase for the sinus infection.

## 2015-01-20 NOTE — Progress Notes (Signed)
   Subjective:    Patient ID: Lillia Dallaseanna K Maggi, female    DOB: 11/24/1970, 44 y.o.   MRN: [id number]  HPI  44 year old female who presents to the office today with multiple complaints.   1) She is here to follow up regarding her blood pressure. She has been taking her medications as directed. Is not monitoring at home. Denies any headaches, blurred vision or dizziness  2) Sinus pressure for the last 3 days. Her sinus pressure is located in the maxillary sinus cavity. Denies any PND, rhinorrhea, or fevers. Her only other symptom is that of left ear itching.  3) She was seen by NP Hyman HopesWebb in July for perceived carpal tunnel syndrome, was given a dose of prednisone, IM shot of Toradol and Norco. Her pain has been well controlled since but over the last week the numbness and tingling in bilateral hands has returned.    Review of Systems  Constitutional: Negative.   HENT: Negative.   Eyes: Negative.   Respiratory: Negative.   Cardiovascular: Negative.   Gastrointestinal: Negative.   Endocrine: Negative.   Genitourinary: Negative.   All other systems reviewed and are negative.      Objective:   Physical Exam  Constitutional: She is oriented to person, place, and time. She appears well-developed and well-nourished. No distress.  HENT:  Head: Normocephalic and atraumatic.  Right Ear: External ear normal.  Left Ear: External ear normal.  Nose: Nose normal.  Mouth/Throat: Oropharynx is clear and moist. No oropharyngeal exudate.  Hypertrophic tonsils TM's visualized. No cerumen impaction. No effusion  Eyes: Conjunctivae and EOM are normal. Pupils are equal, round, and reactive to light. Right eye exhibits no discharge. Left eye exhibits no discharge.  Neck: Normal range of motion. Neck supple.  Cardiovascular: Normal rate, regular rhythm, normal heart sounds and intact distal pulses.  Exam reveals no gallop and no friction rub.   No murmur heard. Pulmonary/Chest: Effort normal and breath  sounds normal. No respiratory distress. She has no wheezes. She has no rales. She exhibits no tenderness.  Musculoskeletal: Normal range of motion. She exhibits no edema.  Lymphadenopathy:    She has cervical adenopathy.  Neurological: She is alert and oriented to person, place, and time.  Skin: Skin is warm and dry. No rash noted. She is not diaphoretic. No erythema. No pallor.  Psychiatric: She has a normal mood and affect. Her behavior is normal. Judgment and thought content normal.  Nursing note and vitals reviewed.      Assessment & Plan:  1. Bilateral carpal tunnel syndrome - Ambulatory referral to Neurology for nerve conduction studies  2. Essential hypertension  - olmesartan-hydrochlorothiazide (BENICAR HCT) 40-25 MG tablet; TAKE ONE TABLET BY MOUTH ONCE DAILY **DISCONTINUE  ALL  SCRIPTS  FOR  LOSARTAN,  HCTZ,  AND  TRIBENZOR**  Dispense: 90 tablet; Refill: 1 - amLODipine (NORVASC) 10 MG tablet; Take 1 tablet (10 mg total) by mouth daily.  Dispense: 90 tablet; Refill: 1 - Monitor blood pressure at home. Bring log to next appointment.  - Follow up in one month 3. Acute maxillary sinusitis, recurrence not specified - Likely Viral in nature.  - FLonase and Sudafed if needed

## 2015-01-21 ENCOUNTER — Other Ambulatory Visit: Payer: Self-pay | Admitting: *Deleted

## 2015-01-21 ENCOUNTER — Other Ambulatory Visit: Payer: Self-pay | Admitting: Adult Health

## 2015-01-21 ENCOUNTER — Telehealth: Payer: Self-pay | Admitting: Family

## 2015-01-21 DIAGNOSIS — G5603 Carpal tunnel syndrome, bilateral upper limbs: Secondary | ICD-10-CM

## 2015-01-21 NOTE — Telephone Encounter (Signed)
error 

## 2015-01-22 ENCOUNTER — Other Ambulatory Visit: Payer: Self-pay | Admitting: Adult Health

## 2015-01-22 MED ORDER — LOSARTAN POTASSIUM 50 MG PO TABS: 50.0000 mg | ORAL_TABLET | Freq: Every day | ORAL | Status: DC

## 2015-01-22 MED ORDER — HYDROCHLOROTHIAZIDE 25 MG PO TABS: 25.0000 mg | ORAL_TABLET | Freq: Every day | ORAL | Status: DC

## 2015-01-23 ENCOUNTER — Telehealth: Payer: Self-pay

## 2015-01-23 NOTE — Telephone Encounter (Signed)
Received a form [employer] stating that Olmesartan Medoxomil/hydrochlorothiazide is not covered by pt's insurance. Per the fax " [employer] cannot perform this prior authorization request because the requested product is a plan exclusion for this member, so there is no coverage criteria to review and apply." Per Kandee Keenory medicaiton changed to Losart 50 mg and HCTZ 25 mg.  This is the same class of medicaiton.   Called and spoke with pt and pt is aware that losartan 50 mg and HCTZ 25 mg were called to the pharmacy and that this will replace the 1 new blood pressure medication Cory called in for pt. Advised pt to call the office if further assistance is needed.

## 2015-02-13 ENCOUNTER — Ambulatory Visit (INDEPENDENT_AMBULATORY_CARE_PROVIDER_SITE_OTHER): Payer: 59 | Admitting: Neurology

## 2015-02-13 DIAGNOSIS — G5603 Carpal tunnel syndrome, bilateral upper limbs: Secondary | ICD-10-CM

## 2015-02-13 NOTE — Procedures (Signed)
[facility]  [address], [address]  [place], [place] [address] Tel: [phone] Fax:  [phone] Test Date:  02/13/2015  Patient: Valerie Gilmore DOB: [birth year] Physician: Nita Sickle, DO  Sex: Female Height: 5\' 7"  Ref Phys: Shirline Frees, M.D.  ID#: [id number] Temp: 33.6C Technician: Judie Petit. Dean   Patient Complaints: This is a 45 year old female referred for evaluation of bilateral hand numbness, tingling, and pain.  NCV & EMG Findings: Extensive electrodiagnostic testing of the right upper extremity and additional studies of the left shows: 1. Bilateral median sensory responses are absent. Bilateral ulnar sensory responses are within normal limits. 2. Bilateral median motor responses shows prolonged distal onset latency (R6.1, L5.8 ms).  Bilateral ulnar motor responses are within normal limits. 3. Sparse chronic motor axon loss changes are seen affecting the right abductor pollicis brevis muscle, without accompanied active denervation.  Impression: Bilateral median neuropathy at or distal to the wrist, consistent with the clinical diagnosis of syndrome. Overall, these findings are moderate to severe in degree electrically, and worse on the right.   ___________________________ Nita Sickle, DO    Nerve Conduction Studies Anti Sensory Summary Table   Site NR Peak (ms) Norm Peak (ms) P-T Amp (V) Norm P-T Amp  Left Median Anti Sensory (2nd Digit)  35.2C  Wrist NR  <3.4  >20  Right Median Anti Sensory (2nd Digit)  35.2C  Wrist NR  <3.4  >20  Left Ulnar Anti Sensory (5th Digit)  35.2C  Wrist    3.0 <3.1 18.5 >12  Right Ulnar Anti Sensory (5th Digit)  35.2C  Wrist    2.8 <3.1 26.4 >12   Motor Summary Table   Site NR Onset (ms) Norm Onset (ms) O-P Amp (mV) Norm O-P Amp Site1 Site2 Delta-0 (ms) Dist (cm) Vel (m/s) Norm Vel (m/s)  Left Median Motor (Abd Poll Brev)  35.2C  Wrist    5.8 <3.9 10.9 >6 Elbow Wrist 4.1 24.0 59 >50  Elbow    9.9  10.6         Right  Median Motor (Abd Poll Brev)  35.2C  Wrist    6.1 <3.9 10.5 >6 Elbow Wrist 4.4 25.0 57 >50  Elbow    10.5  9.6         Left Ulnar Motor (Abd Dig Minimi)  35.2C  Wrist    3.0 <3.1 12.3 >7 B Elbow Wrist 3.6 19.0 53 >50  B Elbow    6.6  11.9  A Elbow B Elbow 1.7 10.0 59 >50  A Elbow    8.3  12.1         Right Ulnar Motor (Abd Dig Minimi)  35.2C  Wrist    2.7 <3.1 11.6 >7 B Elbow Wrist 3.6 21.0 58 >50  B Elbow    6.3  11.6  A Elbow B Elbow 1.5 10.0 67 >50  A Elbow    7.8  11.5          EMG   Side Muscle Ins Act Fibs Psw Fasc Number Recrt Dur Dur. Amp Amp. Poly Poly. Comment  Right 1stDorInt Nml Nml Nml Nml Nml Nml Nml Nml Nml Nml Nml Nml N/A  Right Abd Poll Brev Nml Nml Nml Nml 1- Mod-R Few 1+ Few 1+ Nml Nml N/A  Right Ext Indicis Nml Nml Nml Nml Nml Nml Nml Nml Nml Nml Nml Nml N/A  Right PronatorTeres Nml Nml Nml Nml Nml Nml Nml Nml Nml Nml Nml Nml N/A  Right Biceps Nml Nml Nml Nml Nml Nml Nml Nml Nml Nml Nml Nml N/A  Right Triceps Nml Nml Nml Nml Nml Nml Nml Nml Nml Nml Nml Nml N/A  Right Deltoid Nml Nml Nml Nml Nml Nml Nml Nml Nml Nml Nml Nml N/A  Left Abd Poll Brev Nml Nml Nml Nml Nml Nml Nml Nml Nml Nml Nml Nml N/A  Left PronatorTeres Nml Nml Nml Nml Nml Nml Nml Nml Nml Nml Nml Nml N/A      Waveforms:

## 2015-02-25 ENCOUNTER — Encounter: Payer: 59 | Admitting: Neurology

## 2015-02-28 DIAGNOSIS — H52223 Regular astigmatism, bilateral: Secondary | ICD-10-CM | POA: Diagnosis not present

## 2015-02-28 DIAGNOSIS — H524 Presbyopia: Secondary | ICD-10-CM | POA: Diagnosis not present

## 2015-02-28 DIAGNOSIS — H5213 Myopia, bilateral: Secondary | ICD-10-CM | POA: Diagnosis not present

## 2015-03-11 ENCOUNTER — Telehealth: Payer: Self-pay | Admitting: Adult Health

## 2015-03-11 ENCOUNTER — Ambulatory Visit (INDEPENDENT_AMBULATORY_CARE_PROVIDER_SITE_OTHER): Payer: 59 | Admitting: Adult Health

## 2015-03-11 ENCOUNTER — Encounter: Payer: Self-pay | Admitting: Adult Health

## 2015-03-11 VITALS — BP 150/110 | Temp 98.4°F | Ht 68.0 in | Wt 190.1 lb

## 2015-03-11 DIAGNOSIS — I10 Essential (primary) hypertension: Secondary | ICD-10-CM

## 2015-03-11 DIAGNOSIS — G5603 Carpal tunnel syndrome, bilateral upper limbs: Secondary | ICD-10-CM | POA: Diagnosis not present

## 2015-03-11 MED ORDER — HYDROCHLOROTHIAZIDE 25 MG PO TABS: 25.0000 mg | ORAL_TABLET | Freq: Every day | ORAL | Status: DC

## 2015-03-11 MED ORDER — LOSARTAN POTASSIUM 50 MG PO TABS: 50.0000 mg | ORAL_TABLET | Freq: Every day | ORAL | Status: DC

## 2015-03-11 MED ORDER — AMLODIPINE BESYLATE 10 MG PO TABS: 10.0000 mg | ORAL_TABLET | Freq: Every day | ORAL | Status: DC

## 2015-03-11 MED FILL — LOSARTAN POTASSIUM 50 MG TA: 50 | 90 days supply | Qty: 90 | Fill #0

## 2015-03-11 MED FILL — HYDROCHLOROTHIAZIDE 25 MG T: 25 | 90 days supply | Qty: 90 | Fill #0

## 2015-03-11 MED FILL — AMLODIPINE BESYLATE 10 MG T: 10 | 90 days supply | Qty: 90 | Fill #0

## 2015-03-11 NOTE — Progress Notes (Signed)
Subjective:    Patient ID: Valerie Gilmore, female    DOB: 23-Oct-1970, 45 y.o.   MRN: [id number]  HPI  45 year old female who presents to the office today for follow up regarding her blood pressure. She has been taking HCTZ  and Losartan 50 mg, she has not been taking Norvasc.   She reports that she has stopped drinking sodas and is exercising 5 days a week during her lunch break.   She reports that she is checking her blood pressure at home and they have been running in the 130-140/90's without Norvasc. She did not bring her log into the office today.   She does report that she sometimes has blurred vision and headaches. Has been following up with her eye doctor.   She has had her nerve conduction studies done which showed carpal tunnel that is worse in the right then left.    Review of Systems  Constitutional: Negative.   Eyes: Positive for visual disturbance.  Respiratory: Negative.   Cardiovascular: Negative.   Neurological: Positive for headaches.  All other systems reviewed and are negative.  Past Medical History  Diagnosis Date  . Depression   . Hypertension     high blood pressure readings     Social History   Social History  . Marital Status: Single    Spouse Name: N/A  . Number of Children: N/A  . Years of Education: N/A   Occupational History  . Not on file.   Social History Main Topics  . Smoking status: Never Smoker   . Smokeless tobacco: Not on file  . Alcohol Use: No  . Drug Use: No  . Sexual Activity: Not on file   Other Topics Concern  . Not on file   Social History Narrative    Past Surgical History  Procedure Laterality Date  . Abdominal hysterectomy  2011    Family History  Problem Relation Age of Onset  . Breast cancer Paternal Grandmother   . Prostate cancer Father   . Hypertension      both sides     Allergies  Allergen Reactions  . Other Rash    Powder gloves cause a rash    Current Outpatient Prescriptions on  File Prior to Visit  Medication Sig Dispense Refill  . hydrochlorothiazide (HYDRODIURIL) 25 MG tablet Take 1 tablet (25 mg total) by mouth daily. 90 tablet 3  . losartan (COZAAR) 50 MG tablet Take 1 tablet (50 mg total) by mouth daily. 90 tablet 3  . amLODipine (NORVASC) 10 MG tablet Take 1 tablet (10 mg total) by mouth daily. (Patient not taking: Reported on 03/11/2015) 90 tablet 1   No current facility-administered medications on file prior to visit.    BP 150/110 mmHg  Temp(Src) 98.4 F (36.9 C) (Oral)  Ht  (1.727 m)  Wt 190 lb 1.6 oz (86.229 kg)  BMI 28.91 kg/m2       Objective:   Physical Exam  Constitutional: She is oriented to person, place, and time. She appears well-developed and well-nourished. No distress.  Cardiovascular: Normal rate, regular rhythm, normal heart sounds and intact distal pulses.  Exam reveals no gallop and no friction rub.   No murmur heard. Pulmonary/Chest: Effort normal and breath sounds normal. No respiratory distress. She has no wheezes. She has no rales. She exhibits no tenderness.  Neurological: She is alert and oriented to person, place, and time.  Skin: Skin is warm and dry. No rash noted. She  is not diaphoretic. No erythema. No pallor.  Psychiatric: She has a normal mood and affect. Her behavior is normal. Judgment and thought content normal.  Nursing note and vitals reviewed.     Assessment & Plan:  1. Essential hypertension - Take all medications as directed - Keep log and bring to your next appointment - amLODipine (NORVASC) 10 MG tablet; Take 1 tablet (10 mg total) by mouth daily.  Dispense: 90 tablet; Refill: 2 - hydrochlorothiazide (HYDRODIURIL) 25 MG tablet; Take 1 tablet (25 mg total) by mouth daily.  Dispense: 90 tablet; Refill: 3 - losartan (COZAAR) 50 MG tablet; Take 1 tablet (50 mg total) by mouth daily.  Dispense: 90 tablet; Refill: 3 - Follow up in one month 2. Bilateral carpal tunnel syndrome - We spoke about options  including surgery. She would like to have cortisone injections prior to surgery - Ambulatory referral to Sports Medicine

## 2015-03-11 NOTE — Progress Notes (Signed)
Pre visit review using our clinic review tool, if applicable. No additional management support is needed unless otherwise documented below in the visit note. 

## 2015-03-11 NOTE — Telephone Encounter (Signed)
Pt said [facility] does have generic treibenzor 40-10-25 mg #90 w/refills

## 2015-03-11 NOTE — Patient Instructions (Signed)
It was great seeing you again!  I have sent over a prescription for 90 day supplies of your blood pressure medication.   Continue to monitor at home and bring your log to the next appointment.   Continue to work on diet and exercise.   As discussed, you need to establish with one of the providers, myself, NP Raynelle Fanning and Dr. [place] are all taking new patients.

## 2015-03-12 MED ORDER — OLMESARTAN-AMLODIPINE-HCTZ 40-5-12.5 MG PO TABS: 1.0000 | ORAL_TABLET | Freq: Every day | ORAL | Status: DC

## 2015-03-12 NOTE — Telephone Encounter (Signed)
Rx sent to pharmacy. Discontinued the amlodipine, hctz, and losartan rx.

## 2015-03-12 NOTE — Telephone Encounter (Signed)
Ok to refill 

## 2015-03-18 ENCOUNTER — Telehealth: Payer: Self-pay | Admitting: Adult Health

## 2015-03-18 ENCOUNTER — Ambulatory Visit (INDEPENDENT_AMBULATORY_CARE_PROVIDER_SITE_OTHER): Payer: 59 | Admitting: Adult Health

## 2015-03-18 ENCOUNTER — Encounter: Payer: Self-pay | Admitting: Adult Health

## 2015-03-18 VITALS — BP 138/86 | HR 83 | Temp 98.7°F | Ht 68.0 in | Wt 184.8 lb

## 2015-03-18 DIAGNOSIS — R5383 Other fatigue: Secondary | ICD-10-CM | POA: Diagnosis not present

## 2015-03-18 DIAGNOSIS — K219 Gastro-esophageal reflux disease without esophagitis: Secondary | ICD-10-CM | POA: Diagnosis not present

## 2015-03-18 MED ORDER — LANSOPRAZOLE 15 MG PO CPDR: 15.0000 mg | DELAYED_RELEASE_CAPSULE | Freq: Every day | ORAL | Status: DC

## 2015-03-18 MED ORDER — FLUTICASONE PROPIONATE 50 MCG/ACT NA SUSP: 2.0000 | Freq: Every day | NASAL | Status: DC

## 2015-03-18 MED FILL — FLUTICASONE PROP 50 MCG SPR: 50 | 30 days supply | Qty: 16 | Fill #0

## 2015-03-18 MED FILL — OLMSRTN-AMLDPN-HCTZ 40-5-12: 40-5-12.5 | 90 days supply | Qty: 90 | Fill #0

## 2015-03-18 NOTE — Progress Notes (Signed)
Pre visit review using our clinic review tool, if applicable. No additional management support is needed unless otherwise documented below in the visit note. 

## 2015-03-18 NOTE — Telephone Encounter (Signed)
Pt has an appt with her PCP

## 2015-03-18 NOTE — Progress Notes (Signed)
Subjective:    Patient ID: Valerie Gilmore, female    DOB: [birth year], 45 y.o.   MRN: [id number]  HPI  45 year old female who presents to the office with complaint of generalized weakness, headaches and gassy feeling in her stomach. She reports that when she belches she gets temporary relief. She has not changed her diet. She states " I felt like this a long time ago and I was started on Prevacid, after a few days I felt fine." She denies any chest pain, burning sensation in her throat or waking up with a sour taste in her mouth.     Review of Systems  Constitutional: Positive for fatigue.  Respiratory: Negative.   Cardiovascular: Negative.   Gastrointestinal: Positive for abdominal pain. Negative for nausea, vomiting, diarrhea, constipation and abdominal distention.  Neurological: Positive for headaches.  All other systems reviewed and are negative.  Past Medical History  Diagnosis Date  . Depression   . Hypertension     high blood pressure readings     Social History   Social History  . Marital Status: Single    Spouse Name: N/A  . Number of Children: N/A  . Years of Education: N/A   Occupational History  . Not on file.   Social History Main Topics  . Smoking status: Never Smoker   . Smokeless tobacco: Not on file  . Alcohol Use: No  . Drug Use: No  . Sexual Activity: Not on file   Other Topics Concern  . Not on file   Social History Narrative    Past Surgical History  Procedure Laterality Date  . Abdominal hysterectomy  2011    Family History  Problem Relation Age of Onset  . Breast cancer Paternal Grandmother   . Prostate cancer Father   . Hypertension      both sides     Allergies  Allergen Reactions  . Other Rash    Powder gloves cause a rash    Current Outpatient Prescriptions on File Prior to Visit  Medication Sig Dispense Refill  . amLODipine (NORVASC) 10 MG tablet Take 1 tablet (10 mg total) by mouth daily. 90 tablet 2  .  hydrochlorothiazide (HYDRODIURIL) 25 MG tablet Take 1 tablet (25 mg total) by mouth daily. 90 tablet 3  . losartan (COZAAR) 50 MG tablet Take 1 tablet (50 mg total) by mouth daily. 90 tablet 3  . Olmesartan-Amlodipine-HCTZ (TRIBENZOR) 40-5-12.5 MG TABS Take 1 tablet by mouth daily. (Patient not taking: Reported on 03/18/2015) 90 tablet 3   No current facility-administered medications on file prior to visit.    BP 138/86 mmHg  Pulse 83  Temp(Src) 98.7 F (37.1 C) (Oral)  Ht  (1.727 m)  Wt 184 lb 12.8 oz (83.825 kg)  BMI 28.11 kg/m2  SpO2 97%       Objective:   Physical Exam  Constitutional: She is oriented to person, place, and time. She appears well-developed and well-nourished. No distress.  HENT:  Head: Normocephalic and atraumatic.  Right Ear: External ear normal.  Left Ear: External ear normal.  Nose: Nose normal.  Mouth/Throat: Oropharynx is clear and moist.  Neck: Normal range of motion. Neck supple.  Cardiovascular: Normal rate, regular rhythm, normal heart sounds and intact distal pulses.  Exam reveals no gallop and no friction rub.   No murmur heard. Pulmonary/Chest: Effort normal and breath sounds normal. No respiratory distress. She has no wheezes. She has no rales. She exhibits no tenderness.  Abdominal: Soft. Bowel sounds are normal. She exhibits no distension and no mass. There is no tenderness. There is no rebound and no guarding.  Lymphadenopathy:    She has no cervical adenopathy.  Neurological: She is alert and oriented to person, place, and time.  Skin: Skin is warm and dry. No rash noted. She is not diaphoretic. No erythema. No pallor.  Psychiatric: She has a normal mood and affect. Her behavior is normal. Judgment and thought content normal.  Nursing note and vitals reviewed.     Assessment & Plan:  1. Other fatigue - Likely viral or stress induced. She is not febrile and does not appear acutely ill.   2. Gastroesophageal reflux disease without  esophagitis - lansoprazole (PREVACID) 15 MG capsule; Take 1 capsule (15 mg total) by mouth daily at 12 noon.  Dispense: 90 capsule; Refill: 3

## 2015-03-18 NOTE — Telephone Encounter (Signed)
PLEASE NOTE: All timestamps contained within this report are represented as [place] Standard Time. CONFIDENTIALTY NOTICE: This fax transmission is intended only for the addressee. It contains information that is legally privileged, confidential or otherwise protected from use or disclosure. If you are not the intended recipient, you are strictly prohibited from reviewing, disclosing, copying using or disseminating any of this information or taking any action in reliance on or regarding this information. If you have received this fax in error, please notify us immediately by telephone so that we can arrange for its return to [place]. Phone: [phone], Toll-Free: [phone], Fax: [phone] Page: 1 of 1 Call Id: [id number] [facility] - Client TELEPHONE ADVICE RECORD [facility] Patient Name: Valerie Gilmore DOB: 07/24/1970 Initial Comment Caller states she feels tired, sleepy, weak and irritable, mild chest heaviness and breathing feels more difficult, may be due to starting amlodipine Nurse Assessment Nurse: Elijah Birk, RN, Stark Bray Date/Time (Eastern Time): 03/18/2015 9:57:40 AM Confirm and document reason for call. If symptomatic, describe symptoms. You must click the next button to save text entered. ---Caller states she feels tired, sleepy, weak and irritable, mild chest heaviness and breathing feels more difficult, may be due to starting Amlodipine a week ago. Having blurry vision especially when it is bright. Has the patient traveled out of the country within the last 30 days? ---Not Applicable Does the patient have any new or worsening symptoms? ---Yes Will a triage be completed? ---Yes Related visit to physician within the last 2 weeks? ---Yes Does the PT have any chronic conditions? (i.e. diabetes, asthma, etc.) ---Yes List chronic conditions. ---BP issues Did the patient indicate they were pregnant? ---No Is this a behavioral health or  substance abuse call? ---No Guidelines Guideline Title Affirmed Question Affirmed Notes Breathing Difficulty [1] MILD difficulty breathing (e.g., minimal/no SOB at rest, SOB with walking, pulse <100) AND [2] NEW-onset or WORSE than normal Final Disposition User See Physician within 4 Hours (or PCP triage) Elijah Birk, RN, Stark Bray Comments Referenced Amlodipine on Drugs.com for side effects and there are several symptoms caller is reporting since beginning the rx. Referrals REFERRED TO PCP OFFICE Disagree/Comply: Comply

## 2015-03-19 MED FILL — LANSOPRAZOLE DR 15 MG CAP: 15 | 90 days supply | Qty: 90 | Fill #0

## 2015-03-20 ENCOUNTER — Ambulatory Visit: Payer: 59 | Admitting: Family Medicine

## 2015-04-02 ENCOUNTER — Ambulatory Visit: Payer: 59 | Admitting: Family Medicine

## 2015-05-12 ENCOUNTER — Ambulatory Visit: Payer: 59 | Admitting: Adult Health

## 2015-08-18 MED FILL — OLMSRTN-AMLDPN-HCTZ 40-5-12: 40-5-12.5 | 90 days supply | Qty: 90 | Fill #1

## 2015-08-18 MED FILL — FLUTICASONE PROP 50 MCG SPR: 50 | 30 days supply | Qty: 16 | Fill #1

## 2015-10-08 ENCOUNTER — Ambulatory Visit: Payer: 59 | Admitting: Family Medicine

## 2015-10-29 ENCOUNTER — Encounter: Payer: Self-pay | Admitting: Adult Health

## 2015-10-29 ENCOUNTER — Ambulatory Visit (INDEPENDENT_AMBULATORY_CARE_PROVIDER_SITE_OTHER): Payer: 59 | Admitting: Adult Health

## 2015-10-29 VITALS — BP 138/88 | Temp 98.2°F | Ht 68.0 in | Wt 184.8 lb

## 2015-10-29 DIAGNOSIS — F411 Generalized anxiety disorder: Secondary | ICD-10-CM

## 2015-10-29 LAB — BASIC METABOLIC PANEL
BUN: 14 mg/dL (ref 6–23)
CALCIUM: 9.6 mg/dL (ref 8.4–10.5)
CHLORIDE: 102 meq/L (ref 96–112)
CO2: 28 meq/L (ref 19–32)
Creatinine, Ser: 0.78 mg/dL (ref 0.40–1.20)
GFR: 103.77 mL/min (ref >60.00)
Glucose, Bld: 92 mg/dL (ref 70–99)
Potassium: 4.2 mEq/L (ref 3.5–5.1)
SODIUM: 139 meq/L (ref 135–145)

## 2015-10-29 LAB — CBC
HEMATOCRIT: 43.8 % (ref 36.0–46.0)
Hemoglobin: 14.7 g/dL (ref 12.0–15.0)
MCHC: 33.6 g/dL (ref 30.0–36.0)
MCV: 82.3 fl (ref 78.0–100.0)
PLATELETS: 319 10*3/uL (ref 150.0–400.0)
RBC: 5.32 Mil/uL — AB (ref 3.87–5.11)
RDW: 13.9 % (ref 11.5–15.5)
WBC: 3.6 10*3/uL — AB (ref 4.0–10.5)

## 2015-10-29 LAB — TSH: TSH: 1.18 u[IU]/mL (ref 0.35–4.50)

## 2015-10-29 MED ORDER — CITALOPRAM HYDROBROMIDE 20 MG PO TABS: 20.0000 mg | ORAL_TABLET | Freq: Every day | ORAL | 3 refills | Status: DC

## 2015-10-29 MED FILL — CITALOPRAM HBR 20 MG TABLET: 20 | 30 days supply | Qty: 30 | Fill #0

## 2015-10-29 NOTE — Patient Instructions (Addendum)
It was great seeing you today!  I have sent in a prescription for Celexa. This will help with anxiety   I will follow up with you regarding your labs.   Please let me know if you need anything  Generalized Anxiety Disorder Generalized anxiety disorder (GAD) is a mental disorder. It interferes with life functions, including relationships, work, and school. GAD is different from normal anxiety, which everyone experiences at some point in their lives in response to specific life events and activities. Normal anxiety actually helps us prepare for and get through these life events and activities. Normal anxiety goes away after the event or activity is over.  GAD causes anxiety that is not necessarily related to specific events or activities. It also causes excess anxiety in proportion to specific events or activities. The anxiety associated with GAD is also difficult to control. GAD can vary from mild to severe. People with severe GAD can have intense waves of anxiety with physical symptoms (panic attacks).  SYMPTOMS The anxiety and worry associated with GAD are difficult to control. This anxiety and worry are related to many life events and activities and also occur more days than not for 6 months or longer. People with GAD also have three or more of the following symptoms (one or more in children):  Restlessness.   Fatigue.  Difficulty concentrating.   Irritability.  Muscle tension.  Difficulty sleeping or unsatisfying sleep. DIAGNOSIS GAD is diagnosed through an assessment by your health care provider. Your health care provider will ask you questions aboutyour mood,physical symptoms, and events in your life. Your health care provider may ask you about your medical history and use of alcohol or drugs, including prescription medicines. Your health care provider may also do a physical exam and blood tests. Certain medical conditions and the use of certain substances can cause symptoms  similar to those associated with GAD. Your health care provider may refer you to a mental health specialist for further evaluation. TREATMENT The following therapies are usually used to treat GAD:   Medication. Antidepressant medication usually is prescribed for long-term daily control. Antianxiety medicines may be added in severe cases, especially when panic attacks occur.   Talk therapy (psychotherapy). Certain types of talk therapy can be helpful in treating GAD by providing support, education, and guidance. A form of talk therapy called cognitive behavioral therapy can teach you healthy ways to think about and react to daily life events and activities.  Stress managementtechniques. These include yoga, meditation, and exercise and can be very helpful when they are practiced regularly. A mental health specialist can help determine which treatment is best for you. Some people see improvement with one therapy. However, other people require a combination of therapies.   This information is not intended to replace advice given to you by your health care provider. Make sure you discuss any questions you have with your health care provider.   Document Released: 05/22/2012 Document Revised: 02/15/2014 Document Reviewed: 05/22/2012 Elsevier Interactive Patient Education [employer]2016 Elsevier Inc.

## 2015-10-29 NOTE — Progress Notes (Signed)
Subjective:    Patient ID: Valerie Gilmore, female    DOB: 07/14/1970, 45 y.o.   MRN: [id number]  HPI  45 year old female who presents to the office today for generalized complaints. 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 In addition to this her mother had a stroke two days ago and continues to be in hospital.   She is sleeping 5-6 hours per night.   For the last month she has been experiencing headaches. The headaches are across the front of the head and are described as " a pressure like sensation." She also feels as though she has a tightness in her neck and shoulders. The headaches are intermittent and she does not have them every day   She is also feeling more anxious as of lately. She complains of episodes of feeling as though she is going to have a panic attack, her chest becomes tight and she has shortness of breath along with palpations.     BP Readings from Last 3 Encounters:  03/18/15 138/86  03/11/15 (!) 150/110  01/20/15 (!) 138/100     Review of Systems  Constitutional: Positive for activity change, appetite change and fatigue.  Respiratory: Positive for chest tightness and shortness of breath.   Cardiovascular: Positive for palpitations. Negative for chest pain.  Gastrointestinal: Negative.   Genitourinary: Negative.   Skin: Negative.   Neurological: Positive for headaches. Negative for dizziness, facial asymmetry, light-headedness and numbness.  Psychiatric/Behavioral: Positive for decreased concentration and sleep disturbance. Negative for self-injury and suicidal ideas. The patient is nervous/anxious.   All other systems reviewed and are negative.  Past Medical History:  Diagnosis Date  . Depression   . Hypertension    high blood pressure readings     Social History   Social History  . Marital status:  Single    Spouse name: N/A  . Number of children: N/A  . Years of education: N/A   Occupational History  . Not on file.   Social History Main Topics  . Smoking status: Never Smoker  . Smokeless tobacco: Not on file  . Alcohol use No  . Drug use: No  . Sexual activity: Not on file   Other Topics Concern  . Not on file   Social History Narrative  . No narrative on file    Past Surgical History:  Procedure Laterality Date  . ABDOMINAL HYSTERECTOMY  2011    Family History  Problem Relation Age of Onset  . Breast cancer Paternal Grandmother   . Prostate cancer Father   . Hypertension      both sides     Allergies  Allergen Reactions  . Other Rash    Powder gloves cause a rash    Current Outpatient Prescriptions on File Prior to Visit  Medication Sig Dispense Refill  . amLODipine (NORVASC) 10 MG tablet Take 1 tablet (10 mg total) by mouth daily. 90 tablet 2  . fluticasone (FLONASE) 50 MCG/ACT nasal spray Place 2 sprays into both nostrils daily. 16 g 6  . lansoprazole (PREVACID) 15 MG capsule Take 1 capsule (15 mg total) by mouth daily at 12 noon. 90 capsule 3  . Olmesartan-Amlodipine-HCTZ (TRIBENZOR) 40-5-12.5 MG TABS Take 1 tablet by mouth daily. 90 tablet 3  . losartan (COZAAR) 50 MG tablet Take 1  tablet (50 mg total) by mouth daily. (Patient not taking: Reported on 10/29/2015) 90 tablet 3   No current facility-administered medications on file prior to visit.     BP 138/88   Temp 98.2 F (36.8 C) (Oral)   Ht 5\' 8"  (1.727 m)   Wt 184 lb 12.8 oz (83.8 kg)   BMI 28.10 kg/m       Objective:   Physical Exam  Constitutional: She is oriented to person, place, and time. She appears well-developed and well-nourished. No distress.  HENT:  Head: Normocephalic and atraumatic.  Right Ear: External ear normal.  Left Ear: External ear normal.  Nose: Nose normal.  Mouth/Throat: Oropharynx is clear and moist. No oropharyngeal exudate.  Eyes: Conjunctivae and EOM are  normal. Pupils are equal, round, and reactive to light. Right eye exhibits no discharge. Left eye exhibits no discharge. No scleral icterus.  Neck: Normal range of motion. Neck supple. No JVD present. No tracheal deviation present. No thyromegaly present.  Cardiovascular: Normal rate, regular rhythm, normal heart sounds and intact distal pulses.  Exam reveals no gallop and no friction rub.   No murmur heard. Pulmonary/Chest: Effort normal and breath sounds normal. No respiratory distress. She has no wheezes. She has no rales. She exhibits no tenderness.  Lymphadenopathy:    She has no cervical adenopathy.  Neurological: She is alert and oriented to person, place, and time.  Skin: Skin is warm and dry. No rash noted. She is not diaphoretic. No erythema. No pallor.  Psychiatric: She has a normal mood and affect. Her behavior is normal. Thought content normal.  Nursing note and vitals reviewed.     Assessment & Plan:  1. Generalized anxiety disorder - Her symptoms appear to be from anxiety. I will start her on Celexa 20 mg and have her follow up in 1 month. Continue to eat healthy and exercise.  - Advised to take time for herself daily.  - citalopram (CELEXA) 20 MG tablet; Take 1 tablet (20 mg total) by mouth daily.  Dispense: 30 tablet; Refill: 3 - Basic metabolic panel - CBC - TSH  Shirline Frees, NP

## 2015-12-01 MED FILL — OLMSRTN-AMLDPN-HCTZ 40-5-12: 40-5-12.5 | 90 days supply | Qty: 90 | Fill #2

## 2015-12-01 MED FILL — LANSOPRAZOLE DR 15 MG CAP: 15 | 90 days supply | Qty: 90 | Fill #1

## 2015-12-12 ENCOUNTER — Ambulatory Visit: Payer: 59 | Admitting: Family Medicine

## 2016-02-24 ENCOUNTER — Ambulatory Visit (INDEPENDENT_AMBULATORY_CARE_PROVIDER_SITE_OTHER): Payer: 59 | Admitting: Adult Health

## 2016-02-24 ENCOUNTER — Encounter: Payer: Self-pay | Admitting: Adult Health

## 2016-02-24 VITALS — BP 152/98 | Temp 98.4°F | Ht 68.0 in | Wt 186.8 lb

## 2016-02-24 DIAGNOSIS — R079 Chest pain, unspecified: Secondary | ICD-10-CM | POA: Diagnosis not present

## 2016-02-24 DIAGNOSIS — R1011 Right upper quadrant pain: Secondary | ICD-10-CM | POA: Diagnosis not present

## 2016-02-24 DIAGNOSIS — F411 Generalized anxiety disorder: Secondary | ICD-10-CM | POA: Diagnosis not present

## 2016-02-24 LAB — POCT URINALYSIS DIPSTICK
Bilirubin, UA: NEGATIVE
Glucose, UA: NEGATIVE
KETONES UA: NEGATIVE
Leukocytes, UA: NEGATIVE
Nitrite, UA: NEGATIVE
RBC UA: NEGATIVE
SPEC GRAV UA: 1.025
UROBILINOGEN UA: NEGATIVE
pH, UA: 6

## 2016-02-24 LAB — COMPREHENSIVE METABOLIC PANEL
ALBUMIN: 4.4 g/dL (ref 3.5–5.2)
ALK PHOS: 35 U/L — AB (ref 39–117)
ALT: 24 U/L (ref 0–35)
AST: 22 U/L (ref 0–37)
BILIRUBIN TOTAL: 0.3 mg/dL (ref 0.2–1.2)
BUN: 12 mg/dL (ref 6–23)
CALCIUM: 10.1 mg/dL (ref 8.4–10.5)
CO2: 31 mEq/L (ref 19–32)
Chloride: 103 mEq/L (ref 96–112)
Creatinine, Ser: 0.9 mg/dL (ref 0.40–1.20)
GFR: 87.84 mL/min (ref >60.00)
GLUCOSE: 113 mg/dL — AB (ref 70–99)
POTASSIUM: 4.1 meq/L (ref 3.5–5.1)
Sodium: 141 mEq/L (ref 135–145)
TOTAL PROTEIN: 7.4 g/dL (ref 6.0–8.3)

## 2016-02-24 LAB — CBC WITH DIFFERENTIAL/PLATELET
BASOS ABS: 0 10*3/uL (ref 0.0–0.1)
Basophils Relative: 0.4 % (ref 0.0–3.0)
Eosinophils Absolute: 0 10*3/uL (ref 0.0–0.7)
Eosinophils Relative: 1.1 % (ref 0.0–5.0)
HCT: 42.7 % (ref 36.0–46.0)
HEMOGLOBIN: 14.4 g/dL (ref 12.0–15.0)
LYMPHS ABS: 1.9 10*3/uL (ref 0.7–4.0)
LYMPHS PCT: 55.9 % — AB (ref 12.0–46.0)
MCHC: 33.8 g/dL (ref 30.0–36.0)
MCV: 83.3 fl (ref 78.0–100.0)
MONOS PCT: 12.1 % — AB (ref 3.0–12.0)
Monocytes Absolute: 0.4 10*3/uL (ref 0.1–1.0)
NEUTROS PCT: 30.5 % — AB (ref 43.0–77.0)
Neutro Abs: 1 10*3/uL — ABNORMAL LOW (ref 1.4–7.7)
Platelets: 322 10*3/uL (ref 150.0–400.0)
RBC: 5.12 Mil/uL — AB (ref 3.87–5.11)
RDW: 14.1 % (ref 11.5–15.5)
WBC: 3.4 10*3/uL — ABNORMAL LOW (ref 4.0–10.5)

## 2016-02-24 LAB — LIPASE: LIPASE: 9 U/L — AB (ref 11.0–59.0)

## 2016-02-24 LAB — H. PYLORI ANTIBODY, IGG: H Pylori IgG: NEGATIVE

## 2016-02-24 MED ORDER — SERTRALINE HCL 50 MG PO TABS: 50.0000 mg | ORAL_TABLET | Freq: Every day | ORAL | 3 refills | Status: DC

## 2016-02-24 NOTE — Patient Instructions (Signed)
It was great seeing you today .    I think most of your symptoms are from stress/anxiety   I will follow up with you regarding your blood work.  I have switched your Celexa to Zoloft, take this daily.   Follow up with me in one month

## 2016-02-24 NOTE — Progress Notes (Signed)
Subjective:    Patient ID: Valerie Gilmore, female    DOB: [birth year], 46 y.o.   MRN: [id number]  HPI 46 year old female who presents to the office today for an acute issue. She reports that over the last two weeks she has been experiencing a sharp intermittent pain in her RUQ that radiates under her right breast in between her shoulder blades. She does not notice any difference with this pain with different foods in her diet.   She has also been experiencing intermittent episodes of anxiety with palpitations and chest pain. 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 I saw her in September for anxiety and started her on Celexa. She reports that she took this for about two weeks but quit due to it making her have a headache and and feeling nauseated.   She denies pain radiating to her jaw or down her left arm.     Review of Systems  Constitutional: Negative.   HENT: Negative.   Respiratory: Negative.   Cardiovascular: Positive for chest pain and palpitations.  Gastrointestinal: Positive for abdominal pain and nausea. Negative for blood in stool, constipation, diarrhea, rectal pain and vomiting.  Genitourinary: Negative.   Musculoskeletal: Negative.   Neurological: Positive for headaches.  Hematological: Negative.   Psychiatric/Behavioral: The patient is nervous/anxious.   All other systems reviewed and are negative.  Past Medical History:  Diagnosis Date  . Depression   . Hypertension    high blood pressure readings     Social History   Social History  . Marital status: Single    Spouse name: N/A  . Number of children: N/A  . Years of education: N/A   Occupational History  . Not on file.   Social History Main Topics  . Smoking status: Never Smoker  . Smokeless tobacco: Not on file  . Alcohol use No  . Drug use: No  . Sexual activity: Not on file   Other Topics Concern  . Not on file    Social History Narrative  . No narrative on file    Past Surgical History:  Procedure Laterality Date  . ABDOMINAL HYSTERECTOMY  2011    Family History  Problem Relation Age of Onset  . Breast cancer Paternal Grandmother   . Prostate cancer Father   . Hypertension      both sides     Allergies  Allergen Reactions  . Other Rash    Powder gloves cause a rash    Current Outpatient Prescriptions on File Prior to Visit  Medication Sig Dispense Refill  . amLODipine (NORVASC) 10 MG tablet Take 1 tablet (10 mg total) by mouth daily. 90 tablet 2  . citalopram (CELEXA) 20 MG tablet Take 1 tablet (20 mg total) by mouth daily. 30 tablet 3  . fluticasone (FLONASE) 50 MCG/ACT nasal spray Place 2 sprays into both nostrils daily. 16 g 6  . lansoprazole (PREVACID) 15 MG capsule Take 1 capsule (15 mg total) by mouth daily at 12 noon. 90 capsule 3  . losartan (COZAAR) 50 MG tablet Take 1 tablet (50 mg total) by mouth daily. 90 tablet 3  . Olmesartan-Amlodipine-HCTZ (TRIBENZOR) 40-5-12.5 MG TABS Take 1 tablet by mouth daily. 90 tablet 3   No current facility-administered medications on file prior to visit.     BP (!) 152/98   Temp 98.4 F (36.9 C) (Oral)   Ht 5\' 8"  (  1.727 m)   Wt 186 lb 12.8 oz (84.7 kg)   BMI 28.40 kg/m       Objective:   Physical Exam  Constitutional: She is oriented to person, place, and time. She appears well-developed and well-nourished. No distress.  HENT:  Head: Normocephalic and atraumatic.  Right Ear: External ear normal.  Left Ear: External ear normal.  Nose: Nose normal.  Mouth/Throat: Oropharynx is clear and moist. No oropharyngeal exudate.  Eyes: Conjunctivae and EOM are normal. Pupils are equal, round, and reactive to light. Right eye exhibits no discharge. Left eye exhibits no discharge. No scleral icterus.  Neck: Normal range of motion. Neck supple. No tracheal deviation present. No thyromegaly present.  Cardiovascular: Normal rate, regular  rhythm, normal heart sounds and intact distal pulses.  Exam reveals no gallop and no friction rub.   No murmur heard. Pulmonary/Chest: Effort normal and breath sounds normal. No respiratory distress. She has no wheezes. She has no rales. She exhibits no tenderness.  Abdominal: Soft. Normal appearance and bowel sounds are normal. She exhibits no distension and no mass. There is no hepatosplenomegaly, splenomegaly or hepatomegaly. There is tenderness in the right upper quadrant. There is no rigidity, no rebound, no guarding, no CVA tenderness, no tenderness at McBurney's point and negative Murphy's sign. No hernia.  Musculoskeletal: Normal range of motion.  Lymphadenopathy:    She has no cervical adenopathy.  Neurological: She is alert and oriented to person, place, and time. No cranial nerve deficit. Coordination normal.  Skin: Skin is warm and dry. No rash noted. No erythema. No pallor.  Psychiatric: She has a normal mood and affect. Her behavior is normal. Judgment and thought content normal.  Nursing note and vitals reviewed.     Assessment & Plan:  1. Chest pain, unspecified type - Likely from anxiety or referred pain from gallbladder disease.  - EKG 12-Lead - CBC with Differential/Platelet - Comprehensive metabolic panel - H. pylori antibody, IgG - Lipase - US Abdomen Limited RUQ; Future  2. RUQ pain - Possible gallbladder disease.  - EKG 12-Lead- NSR 0 Rate 69  - sertraline (ZOLOFT) 50 MG tablet; Take 1 tablet (50 mg total) by mouth daily.  Dispense: 30 tablet; Refill: 3 - CBC with Differential/Platelet - Comprehensive metabolic panel - H. pylori antibody, IgG - Lipase - US Abdomen Limited RUQ; Future - POC Urinalysis Dipstick  3. Generalized anxiety disorder - d/c celexa  - sertraline (ZOLOFT) 50 MG tablet; Take 1 tablet (50 mg total) by mouth daily.  Dispense: 30 tablet; Refill: 3 - Follow up in one month or sooner if needed  Shirline Freesory Rickia [place], NP

## 2016-03-15 ENCOUNTER — Other Ambulatory Visit: Payer: Self-pay | Admitting: Adult Health

## 2016-03-16 MED FILL — OLMSRTN-AMLDPN-HCTZ 40-5-12: 40-5-12.5 | 90 days supply | Qty: 90 | Fill #0

## 2016-04-15 ENCOUNTER — Ambulatory Visit (INDEPENDENT_AMBULATORY_CARE_PROVIDER_SITE_OTHER): Payer: 59 | Admitting: Adult Health

## 2016-04-15 VITALS — BP 132/84 | Temp 98.0°F | Ht 68.0 in | Wt 185.4 lb

## 2016-04-15 DIAGNOSIS — J0100 Acute maxillary sinusitis, unspecified: Secondary | ICD-10-CM | POA: Diagnosis not present

## 2016-04-15 MED ORDER — PREDNISONE 20 MG PO TABS: 20.0000 mg | ORAL_TABLET | Freq: Every day | ORAL | 0 refills | Status: DC

## 2016-04-15 MED ORDER — DOXYCYCLINE HYCLATE 100 MG PO CAPS: 100.0000 mg | ORAL_CAPSULE | Freq: Two times a day (BID) | ORAL | 0 refills | Status: DC

## 2016-04-15 MED ORDER — FLUTICASONE PROPIONATE 50 MCG/ACT NA SUSP: 2.0000 | Freq: Every day | NASAL | 6 refills | Status: DC

## 2016-04-15 MED FILL — FLUTICASONE PROP 50 MCG SPR: 50 | 30 days supply | Qty: 16 | Fill #0

## 2016-04-15 MED FILL — predniSONE 20 MG TABS: 20 | 7 days supply | Qty: 7 | Fill #0

## 2016-04-15 MED FILL — DOXYCYCLINE HYCLATE 100 MG: 100 | 7 days supply | Qty: 14 | Fill #0

## 2016-04-15 NOTE — Progress Notes (Signed)
Subjective:    Patient ID: Valerie Gilmore, female    DOB: 03/22/1970, 46 y.o.   MRN: [id number]  Headache   This is a new problem. The current episode started 1 to 4 weeks ago (3 weeks ). The problem occurs intermittently. The pain is located in the right unilateral and temporal region. The pain does not radiate. The pain quality is not similar to prior headaches. The quality of the pain is described as dull. The pain is at a severity of 5/10. Associated symptoms include ear pain, eye watering, a loss of balance, numbness, rhinorrhea and sinus pressure. Pertinent negatives include no blurred vision, coughing, dizziness, drainage, eye pain, eye redness, fever, nausea, phonophobia, photophobia, scalp tenderness, sore throat, tinnitus or visual change.      Review of Systems  Constitutional: Negative.  Negative for fever.  HENT: Positive for ear pain, rhinorrhea and sinus pressure. Negative for congestion, ear discharge, sinus pain, sore throat and tinnitus.   Eyes: Negative for blurred vision, photophobia, pain and redness.  Respiratory: Negative.  Negative for cough.   Cardiovascular: Negative.   Gastrointestinal: Negative.  Negative for nausea.  Neurological: Positive for numbness, headaches and loss of balance. Negative for dizziness.   Past Medical History:  Diagnosis Date  . Depression   . Hypertension    high blood pressure readings     Social History   Social History  . Marital status: Single    Spouse name: N/A  . Number of children: N/A  . Years of education: N/A   Occupational History  . Not on file.   Social History Main Topics  . Smoking status: Never Smoker  . Smokeless tobacco: Not on file  . Alcohol use No  . Drug use: No  . Sexual activity: Not on file   Other Topics Concern  . Not on file   Social History Narrative  . No narrative on file    Past Surgical History:  Procedure Laterality Date  . ABDOMINAL HYSTERECTOMY  2011    Family History    Problem Relation Age of Onset  . Breast cancer Paternal Grandmother   . Prostate cancer Father   . Hypertension      both sides     Allergies  Allergen Reactions  . Other Rash    Powder gloves cause a rash    Current Outpatient Prescriptions on File Prior to Visit  Medication Sig Dispense Refill  . amLODipine (NORVASC) 10 MG tablet Take 1 tablet (10 mg total) by mouth daily. 90 tablet 2  . lansoprazole (PREVACID) 15 MG capsule Take 1 capsule (15 mg total) by mouth daily at 12 noon. 90 capsule 3  . losartan (COZAAR) 50 MG tablet Take 1 tablet (50 mg total) by mouth daily. 90 tablet 3  . Olmesartan-Amlodipine-HCTZ 40-5-12.5 MG TABS TAKE 1 TABLET BY MOUTH ONCE DAILY 90 tablet 1  . sertraline (ZOLOFT) 50 MG tablet Take 1 tablet (50 mg total) by mouth daily. 30 tablet 3   No current facility-administered medications on file prior to visit.     BP 132/84   Temp 98 F (36.7 C) (Oral)   Ht 5\' 8"  (1.727 m)   Wt 185 lb 6.4 oz (84.1 kg)   BMI 28.19 kg/m       Objective:   Physical Exam  Constitutional: She is oriented to person, place, and time. She appears well-developed and well-nourished. No distress.  HENT:  Head: Normocephalic and atraumatic.  Right Ear: Hearing, external ear  and ear canal normal. Tympanic membrane is bulging. Tympanic membrane is not erythematous.  Left Ear: Hearing, tympanic membrane, external ear and ear canal normal. Tympanic membrane is not erythematous and not bulging.  Nose: No mucosal edema or rhinorrhea. Right sinus exhibits maxillary sinus tenderness. Right sinus exhibits no frontal sinus tenderness. Left sinus exhibits no maxillary sinus tenderness and no frontal sinus tenderness.  Mouth/Throat: Uvula is midline, oropharynx is clear and moist and mucous membranes are normal. No oropharyngeal exudate.  Neck: Trachea normal. Carotid bruit is not present. No thyroid mass and no thyromegaly present.  Cardiovascular: Normal rate, regular rhythm, normal  heart sounds and intact distal pulses.  Exam reveals no gallop and no friction rub.   No murmur heard. Pulmonary/Chest: Effort normal and breath sounds normal. No respiratory distress. She has no wheezes. She has no rales. She exhibits no tenderness.  Neurological: She is alert and oriented to person, place, and time.  Skin: Skin is warm and dry. No rash noted. She is not diaphoretic. No erythema. No pallor.  Psychiatric: She has a normal mood and affect. Her behavior is normal. Judgment and thought content normal.  Nursing note and vitals reviewed.     Assessment & Plan:  1. Acute maxillary sinusitis, recurrence not specified - Exam consistent with sinusitis causing her headaches.  - doxycycline (VIBRAMYCIN) 100 MG capsule; Take 1 capsule (100 mg total) by mouth 2 (two) times daily.  Dispense: 14 capsule; Refill: 0 - predniSONE (DELTASONE) 20 MG tablet; Take 1 tablet (20 mg total) by mouth daily with breakfast.  Dispense: 7 tablet; Refill: 0 - fluticasone (FLONASE) 50 MCG/ACT nasal spray; Place 2 sprays into both nostrils daily.  Dispense: 16 g; Refill: 6 - Follow up if no improvement   Shirline Frees, NP

## 2016-04-26 DIAGNOSIS — H02825 Cysts of left lower eyelid: Secondary | ICD-10-CM | POA: Diagnosis not present

## 2016-04-26 DIAGNOSIS — H524 Presbyopia: Secondary | ICD-10-CM | POA: Diagnosis not present

## 2016-04-26 DIAGNOSIS — H52223 Regular astigmatism, bilateral: Secondary | ICD-10-CM | POA: Diagnosis not present

## 2016-04-26 DIAGNOSIS — H5213 Myopia, bilateral: Secondary | ICD-10-CM | POA: Diagnosis not present

## 2016-06-11 MED FILL — OLMSRTN-AMLDPN-HCTZ 40-5-12: 40-5-12.5 | 90 days supply | Qty: 90 | Fill #1

## 2016-07-08 ENCOUNTER — Telehealth: Payer: Self-pay | Admitting: Adult Health

## 2016-07-08 NOTE — Telephone Encounter (Signed)
Patient will need to be evaluated.  Would you mind scheduling patient? Thank you!

## 2016-07-08 NOTE — Telephone Encounter (Signed)
She saw Padonda for this back in July 2016. I would have to reevaluate her before more steroids could be given

## 2016-07-08 NOTE — Telephone Encounter (Signed)
° ° °  Pt has another flair up with her hands and is asking if Kandee KeenCory can call her in some prednisone. t is the same flair up she had about 6 months ago    Ph[facility]

## 2016-07-08 NOTE — Telephone Encounter (Signed)
See below and advise

## 2016-07-09 ENCOUNTER — Encounter: Payer: Self-pay | Admitting: Family Medicine

## 2016-07-09 ENCOUNTER — Ambulatory Visit (INDEPENDENT_AMBULATORY_CARE_PROVIDER_SITE_OTHER): Payer: 59 | Admitting: Family Medicine

## 2016-07-09 VITALS — BP 140/90 | HR 80 | Temp 98.7°F | Ht 68.0 in | Wt 192.3 lb

## 2016-07-09 DIAGNOSIS — I10 Essential (primary) hypertension: Secondary | ICD-10-CM | POA: Diagnosis not present

## 2016-07-09 DIAGNOSIS — G5603 Carpal tunnel syndrome, bilateral upper limbs: Secondary | ICD-10-CM | POA: Diagnosis not present

## 2016-07-09 DIAGNOSIS — R030 Elevated blood-pressure reading, without diagnosis of hypertension: Secondary | ICD-10-CM | POA: Diagnosis not present

## 2016-07-09 MED ORDER — OLMESARTAN-AMLODIPINE-HCTZ 40-5-25 MG PO TABS: 1.0000 | ORAL_TABLET | Freq: Every day | ORAL | 0 refills | Status: DC

## 2016-07-09 MED FILL — OLMSRTN-AMLDPN-HCTZ 40-5-25: 40-5-25 | 30 days supply | Qty: 30 | Fill #0

## 2016-07-09 NOTE — Telephone Encounter (Signed)
I will route to Cory as FYI.  

## 2016-07-09 NOTE — Progress Notes (Signed)
HPI:  Acute visit for several issues:  Bilateral hand/wrist pain: -reports prior evaluation and dx CTS -reports was treated with prednisone and antibiotic in the past and symptoms resolved -symptoms are pain and tingling thenar aspect and radial hand/wrist worse at night -has wrist splint but hurts - thought was supposed to be applied tight - so doesn't use -no weakness, fevers, malaise, redness   Elevated blood pressure: -reports long struggle with this -on combo BP med and report PCP was considering increasing it but she missed the appointment -now stressed as was in dispute on the way here so thinks might be up from that, but still concerned -no smoking or alcohol -poor diet -denies CP, SOB, DOE -does have mild headache  ROS: See pertinent positives and negatives per HPI.  Past Medical History:  Diagnosis Date  . Depression   . Hypertension    high blood pressure readings     Past Surgical History:  Procedure Laterality Date  . ABDOMINAL HYSTERECTOMY  2011    Family History  Problem Relation Age of Onset  . Breast cancer Paternal Grandmother   . Prostate cancer Father   . Hypertension Unknown        both sides     Social History   Social History  . Marital status: Single    Spouse name: N/A  . Number of children: N/A  . Years of education: N/A   Social History Main Topics  . Smoking status: Never Smoker  . Smokeless tobacco: Never Used  . Alcohol use No  . Drug use: No  . Sexual activity: Not Asked   Other Topics Concern  . None   Social History Narrative  . None     Current Outpatient Prescriptions:  .  fluticasone (FLONASE) 50 MCG/ACT nasal spray, Place 2 sprays into both nostrils daily., Disp: 16 g, Rfl: 6 .  lansoprazole (PREVACID) 15 MG capsule, Take 1 capsule (15 mg total) by mouth daily at 12 noon., Disp: 90 capsule, Rfl: 3 .  sertraline (ZOLOFT) 50 MG tablet, Take 1 tablet (50 mg total) by mouth daily., Disp: 30 tablet, Rfl: 3 .   Olmesartan-Amlodipine-HCTZ 40-5-25 MG TABS, Take 1 tablet by mouth daily., Disp: 30 tablet, Rfl: 0  EXAM:  Vitals:   07/09/16 1036 07/09/16 1038  BP: (!) 140/98 140/90  Pulse: 80   Temp: 98.7 F (37.1 C)     Body mass index is 29.24 kg/m.  GENERAL: vitals reviewed and listed above, alert, oriented, appears well hydrated and in no acute distress  HEENT: atraumatic, conjunttiva clear, no obvious abnormalities on inspection of external nose and ears  NECK: no obvious masses on inspection  LUNGS: clear to auscultation bilaterally, no wheezes, rales or rhonchi, good air movement  CV: HRRR, no peripheral edema  MS: moves all extremities without noticeable abnormality, no swelling or redness of hands and wrists on inspection, TTP in muscle thenar eminence and forearm flexor, ROM/strength ok, no atrophy on exam, NV distal  PSYCH: pleasant and cooperative, no obvious depression or anxiety  ASSESSMENT AND PLAN:  Discussed the following assessment and plan:  Bilateral carpal tunnel syndrome -wrist splints at night, advised on how to use and comfortable options -OTC options for pain as needed -close follow up in 2-3 weeks to recheck with PCP  Essential hypertension Elevated blood pressure reading -opted to increase diuretic portion BP med after discussion options -follow up [facility] 2-3 weeks -lifestyle recs  -Patient advised to return or notify a doctor immediately  if symptoms worsen or persist or new concerns arise.  Patient Instructions  BEFORE YOU LEAVE: -follow up: with Kandee Keenory in 2-3 weeks  Start the new dose of the blood pressure medication 40-5-25 (I sent this to your pharmacy)  Use the wrist braces every night. Apply loosely. [employer] for women braces are quite comfortable and available on [employer].  Can use aleve per instructions, tylenol per instructions, tiger balm (menthol) or topical capsaicin sports creams for pain as needed. Can ice as needed as well.  I hope you  are feeling better soon! Seek care immediately if worsening, new concerns or you are not improving with treatment.     Kriste BasqueKIM, HANNAH R., DO

## 2016-07-09 NOTE — Telephone Encounter (Signed)
Pt states she works 2 jobs and can only come in between 10-12 today.  cory does not have this time available. Pt states she cannot go through the weekend with this pain.   Pt is scheduled with Dr Selena BattenKim today.

## 2016-07-09 NOTE — Patient Instructions (Signed)
BEFORE YOU LEAVE: -follow up: with Cory in 2-3 weeks  Start the new dose of the blood pressure medication 40-5-25 (I sent this to your pharmacy)  Use the wrist braces every night. Apply loosely. [employer] for women braces are quite comfortable and available on [employer].  Can use aleve per instructions, tylenol per instructions, tiger balm (menthol) or topical capsaicin sports creams for pain as needed. Can ice as needed as well.  I hope you are feeling better soon! Seek care immediately if worsening, new concerns or you are not improving with treatment.

## 2016-07-20 ENCOUNTER — Ambulatory Visit: Payer: 59 | Admitting: Adult Health

## 2016-07-21 ENCOUNTER — Telehealth: Payer: Self-pay | Admitting: *Deleted

## 2016-07-21 ENCOUNTER — Encounter: Payer: Self-pay | Admitting: Adult Health

## 2016-07-21 ENCOUNTER — Ambulatory Visit (INDEPENDENT_AMBULATORY_CARE_PROVIDER_SITE_OTHER): Payer: 59 | Admitting: Adult Health

## 2016-07-21 VITALS — BP 124/80 | Temp 98.2°F | Ht 68.0 in | Wt 189.2 lb

## 2016-07-21 DIAGNOSIS — G5603 Carpal tunnel syndrome, bilateral upper limbs: Secondary | ICD-10-CM

## 2016-07-21 DIAGNOSIS — I10 Essential (primary) hypertension: Secondary | ICD-10-CM

## 2016-07-21 MED ORDER — PREDNISONE 10 MG PO TABS: ORAL_TABLET | ORAL | 0 refills | Status: DC

## 2016-07-21 MED FILL — predniSONE 10 MG TABS: 10 | 14 days supply | Qty: 21 | Fill #0

## 2016-07-21 NOTE — Telephone Encounter (Signed)
Caller name: [facility] Relationship to patient: Pharmacy  Reason for call: They are having computer issues and are not able to pull electronic rx from today for prednisone. Verbal order given per medication list. No further needs identified at this time.

## 2016-07-21 NOTE — Progress Notes (Signed)
Subjective:    Patient ID: Valerie Gilmore, female    DOB: 06/14/1970, 46 y.o.   MRN: [id number]  HPI  46 year old female who presents to the office today for follow up due to hypertension and carpal tunnel pain. She was seen by Dr. Kriste BasqueHannah Kim, two weeks ago. At this office visit Dr. Selena BattenKim advised that she wear wrist splints at night and to take OTC pain medication as needed for carpal tunnel symptoms. As for her elevated blood pressure readings, Tribenzor was increased and she was advised on lifestyle modification activity.   Today in the office she reports that she continues to have severe pain in her wrists R>L. She is wearing her wrist splints at night. When I saw her last year for this issue she responded well to prednisone therapy.   Her blood pressure has improved in the office today. 122/76   Review of Systems See HPI   Past Medical History:  Diagnosis Date  . Depression   . Hypertension    high blood pressure readings     Social History   Social History  . Marital status: Single    Spouse name: N/A  . Number of children: N/A  . Years of education: N/A   Occupational History  . Not on file.   Social History Main Topics  . Smoking status: Never Smoker  . Smokeless tobacco: Never Used  . Alcohol use No  . Drug use: No  . Sexual activity: Not on file   Other Topics Concern  . Not on file   Social History Narrative  . No narrative on file    Past Surgical History:  Procedure Laterality Date  . ABDOMINAL HYSTERECTOMY  2011    Family History  Problem Relation Age of Onset  . Breast cancer Paternal Grandmother   . Prostate cancer Father   . Hypertension Unknown        both sides     Allergies  Allergen Reactions  . Other Rash    Powder gloves cause a rash    Current Outpatient Prescriptions on File Prior to Visit  Medication Sig Dispense Refill  . fluticasone (FLONASE) 50 MCG/ACT nasal spray Place 2 sprays into both nostrils daily. 16 g 6  .  lansoprazole (PREVACID) 15 MG capsule Take 1 capsule (15 mg total) by mouth daily at 12 noon. 90 capsule 3  . Olmesartan-Amlodipine-HCTZ 40-5-25 MG TABS Take 1 tablet by mouth daily. 30 tablet 0  . sertraline (ZOLOFT) 50 MG tablet Take 1 tablet (50 mg total) by mouth daily. 30 tablet 3   No current facility-administered medications on file prior to visit.     BP 124/80 (BP Location: Left Arm, Patient Position: Sitting, Cuff Size: Normal)   Temp 98.2 F (36.8 C) (Oral)   Ht 5\' 8"  (1.727 m)   Wt 189 lb 3.2 oz (85.8 kg)   BMI 28.77 kg/m       Objective:   Physical Exam  Constitutional: She is oriented to person, place, and time. She appears well-developed and well-nourished. No distress.  Cardiovascular: Normal rate, regular rhythm, normal heart sounds and intact distal pulses.  Exam reveals no gallop and no friction rub.   No murmur heard. Pulmonary/Chest: Effort normal and breath sounds normal. No respiratory distress. She has no wheezes. She has no rales. She exhibits no tenderness.  Musculoskeletal:  + Tinels and Phalens  Neurological: She is alert and oriented to person, place, and time.  Skin: Skin is  warm and dry. No rash noted. She is not diaphoretic. No erythema. No pallor.  Psychiatric: She has a normal mood and affect. Her behavior is normal. Judgment and thought content normal.  Nursing note and vitals reviewed.     Assessment & Plan:  1. Bilateral carpal tunnel syndrome - predniSONE (DELTASONE) 10 MG tablet; 20 mg x 7 days and then 10 mg x 7 days  Dispense: 21 tablet; Refill: 0 - Follow up if no improvement - Consider referral to Sports Medicine for steroid injection   2. Essential hypertension - Better controlled. Keep on current dose    Shirline Frees, NP

## 2016-08-18 ENCOUNTER — Telehealth: Payer: Self-pay | Admitting: Family Medicine

## 2016-08-18 MED ORDER — OLMESARTAN-AMLODIPINE-HCTZ 40-5-25 MG PO TABS: 1.0000 | ORAL_TABLET | Freq: Every day | ORAL | 3 refills | Status: DC

## 2016-08-18 MED FILL — OLMSRTN-AMLDPN-HCTZ 40-5-25: 40-5-25 | 90 days supply | Qty: 90 | Fill #0

## 2016-08-18 NOTE — Addendum Note (Signed)
Addended by: Johnella MoloneyFUNDERBURK, JO A on: 08/18/2016 09:22 AM   Modules accepted: Orders

## 2016-08-18 NOTE — Telephone Encounter (Signed)
Pt has only 2 pills left. °

## 2016-08-18 NOTE — Telephone Encounter (Signed)
Rx done. 

## 2016-08-18 NOTE — Telephone Encounter (Signed)
Ok to refill for one year  

## 2016-12-01 MED FILL — OLMSRTN-AMLDPN-HCTZ 40-5-25: 40-5-25 | 90 days supply | Qty: 90 | Fill #1

## 2017-02-24 ENCOUNTER — Encounter: Payer: Self-pay | Admitting: Adult Health

## 2017-02-24 ENCOUNTER — Ambulatory Visit: Payer: 59 | Admitting: Adult Health

## 2017-02-24 DIAGNOSIS — F411 Generalized anxiety disorder: Secondary | ICD-10-CM | POA: Diagnosis not present

## 2017-02-24 MED ORDER — SERTRALINE HCL 50 MG PO TABS: 50.0000 mg | ORAL_TABLET | Freq: Every day | ORAL | 3 refills | Status: DC

## 2017-02-24 MED FILL — SERTRALINE HCL 50 MG TABLET: 50 | 30 days supply | Qty: 30 | Fill #0

## 2017-02-24 NOTE — Patient Instructions (Signed)
Please call [facility] and schedule an appointment   Address: [address], [place], [place] [address] Phone: [phone]

## 2017-02-24 NOTE — Progress Notes (Signed)
Subjective:    Patient ID: Valerie Gilmore, female    DOB: Aug 25, 1970, 47 y.o.   MRN: [id number]  HPI  47 year old female who presents to the office today for concerns of ADHD and anxiety.  She reports that she is currently in school and is taking basic anatomy.  She feels as though her anxiety is not well controlled at this time due to the stress of schoolwork, work, and being apparent.  She endorses frequent panic attacks and is unable to stay focused.  She is also wondering if she has ADHD and would like to be formally tested for this.  Denies any depression, chest pain, palpitations, or shortness of breath.  He has been on Zoloft in the past and did well with this medication.  Review of Systems See HPI   Past Medical History:  Diagnosis Date  . Depression   . Hypertension    high blood pressure readings     Social History   Socioeconomic History  . Marital status: Single    Spouse name: Not on file  . Number of children: Not on file  . Years of education: Not on file  . Highest education level: Not on file  Social Needs  . Financial resource strain: Not on file  . Food insecurity - worry: Not on file  . Food insecurity - inability: Not on file  . Transportation needs - medical: Not on file  . Transportation needs - non-medical: Not on file  Occupational History  . Not on file  Tobacco Use  . Smoking status: Never Smoker  . Smokeless tobacco: Never Used  Substance and Sexual Activity  . Alcohol use: No    Alcohol/week: 0.0 oz  . Drug use: No  . Sexual activity: Not on file  Other Topics Concern  . Not on file  Social History Narrative  . Not on file    Past Surgical History:  Procedure Laterality Date  . ABDOMINAL HYSTERECTOMY  2011    Family History  Problem Relation Age of Onset  . Breast cancer Paternal Grandmother   . Prostate cancer Father   . Hypertension Unknown        both sides     Allergies  Allergen Reactions  . Other Rash    Powder  gloves cause a rash    Current Outpatient Medications on File Prior to Visit  Medication Sig Dispense Refill  . fluticasone (FLONASE) 50 MCG/ACT nasal spray Place 2 sprays into both nostrils daily. 16 g 6  . lansoprazole (PREVACID) 15 MG capsule Take 1 capsule (15 mg total) by mouth daily at 12 noon. 90 capsule 3  . Olmesartan-Amlodipine-HCTZ 40-5-25 MG TABS Take 1 tablet by mouth daily. 90 tablet 3  . predniSONE (DELTASONE) 10 MG tablet 20 mg x 7 days and then 10 mg x 7 days 21 tablet 0   No current facility-administered medications on file prior to visit.     BP 130/86 (BP Location: Left Arm, Patient Position: Sitting, Cuff Size: Normal)   Pulse 82   Temp 98.2 F (36.8 C) (Oral)   Ht 5\' 8"  (1.727 m)   Wt 184 lb (83.5 kg)   SpO2 99%   BMI 27.98 kg/m       Objective:   Physical Exam  Constitutional: She is oriented to person, place, and time. She appears well-developed and well-nourished. No distress.  Cardiovascular: Normal rate, regular rhythm, normal heart sounds and intact distal pulses. Exam reveals no gallop and no  friction rub.  No murmur heard. Pulmonary/Chest: Effort normal and breath sounds normal. No respiratory distress. She has no wheezes. She has no rales. She exhibits no tenderness.  Musculoskeletal: She exhibits no edema, tenderness or deformity.  Neurological: She is alert and oriented to person, place, and time.  Skin: Skin is dry. No rash noted. She is not diaphoretic. No erythema.  Psychiatric: She has a normal mood and affect. Her behavior is normal. Thought content normal.  Nursing note and vitals reviewed.     Assessment & Plan:    1. Generalized anxiety disorder -We will restart on Zoloft 50 mg daily and titrate as needed.  She was advised that I do not test for ADHD and she was given the number to [place] [facility] to call and make an appointment - sertraline (ZOLOFT) 50 MG tablet; Take 1 tablet (50 mg total) by mouth daily.  Dispense:  30 tablet; Refill: 3 -Follow  up in 1 month

## 2017-03-01 MED FILL — OLMSRTN-AMLDPN-HCTZ 40-5-25: 40-5-25 | 90 days supply | Qty: 90 | Fill #2

## 2017-03-07 DIAGNOSIS — F419 Anxiety disorder, unspecified: Secondary | ICD-10-CM | POA: Diagnosis not present

## 2017-03-07 DIAGNOSIS — F429 Obsessive-compulsive disorder, unspecified: Secondary | ICD-10-CM | POA: Diagnosis not present

## 2017-03-07 DIAGNOSIS — F4010 Social phobia, unspecified: Secondary | ICD-10-CM | POA: Diagnosis not present

## 2017-03-07 DIAGNOSIS — I10 Essential (primary) hypertension: Secondary | ICD-10-CM | POA: Diagnosis not present

## 2017-03-07 DIAGNOSIS — F338 Other recurrent depressive disorders: Secondary | ICD-10-CM | POA: Diagnosis not present

## 2017-03-07 DIAGNOSIS — R41840 Attention and concentration deficit: Secondary | ICD-10-CM | POA: Diagnosis not present

## 2017-03-07 MED FILL — ATOMOXETINE HCL 25 MG CAP: 25 | 30 days supply | Qty: 60 | Fill #0

## 2017-04-15 DIAGNOSIS — Z79899 Other long term (current) drug therapy: Secondary | ICD-10-CM | POA: Diagnosis not present

## 2017-04-15 DIAGNOSIS — F902 Attention-deficit hyperactivity disorder, combined type: Secondary | ICD-10-CM | POA: Diagnosis not present

## 2017-04-15 MED FILL — ATOMOXETINE HCL 10 MG CAP: 10 | 30 days supply | Qty: 30 | Fill #0

## 2017-05-05 ENCOUNTER — Ambulatory Visit (INDEPENDENT_AMBULATORY_CARE_PROVIDER_SITE_OTHER): Payer: Self-pay | Admitting: Nurse Practitioner

## 2017-05-05 VITALS — BP 130/90 | HR 78 | Temp 99.0°F | Resp 18

## 2017-05-05 DIAGNOSIS — M5431 Sciatica, right side: Secondary | ICD-10-CM

## 2017-05-05 DIAGNOSIS — M5432 Sciatica, left side: Secondary | ICD-10-CM

## 2017-05-05 MED ORDER — PREDNISONE 10 MG (21) PO TBPK: ORAL_TABLET | ORAL | 0 refills | Status: AC

## 2017-05-05 MED ORDER — BACLOFEN 10 MG PO TABS: 10.0000 mg | ORAL_TABLET | Freq: Three times a day (TID) | ORAL | 0 refills | Status: AC

## 2017-05-05 NOTE — Progress Notes (Signed)
   Subjective:    Patient ID: Valerie Gilmore, female    DOB: 04/28/1970, 47 y.o.   MRN: [id number]  Back Pain  This is a new problem. The current episode started 1 to 4 weeks ago. The problem occurs constantly. The problem has been gradually worsening since onset. The pain is present in the lumbar spine. The quality of the pain is described as aching. The pain radiates to the left knee. The pain is at a severity of 8/10. The pain is moderate. The pain is the same all the time. The symptoms are aggravated by bending, stress and twisting. Stiffness is present in the morning. Associated symptoms include leg pain and weakness. Pertinent negatives include no numbness, pelvic pain, perianal numbness or tingling. Risk factors: history of RLE sciatica. Treatments tried: Tylenol. The treatment provided mild relief.      Review of Systems  Constitutional: Negative.   HENT: Negative.   Eyes: Negative.   Respiratory: Negative.   Cardiovascular: Negative.   Gastrointestinal: Negative.   Endocrine: Negative.   Genitourinary: Negative for pelvic pain.  Musculoskeletal: Positive for back pain.       Radiates to left lower leg  Skin: Negative.   Neurological: Positive for weakness. Negative for tingling and numbness.  Psychiatric/Behavioral:       Anxious       Objective:   Physical Exam  Constitutional: She is oriented to person, place, and time. She appears well-developed and well-nourished.  Due to left lower back pain  HENT:  Head: Normocephalic and atraumatic.  Eyes: Pupils are equal, round, and reactive to light. Conjunctivae and EOM are normal.  Neck: Normal range of motion. Neck supple. No tracheal deviation present. No thyromegaly present.  Cardiovascular: Normal rate, regular rhythm and normal heart sounds.  Pulmonary/Chest: Effort normal and breath sounds normal. No respiratory distress.  Abdominal: Soft. Bowel sounds are normal. She exhibits no distension. There is no tenderness.   Musculoskeletal: She exhibits tenderness.  Point tenderness to lower back bilateral, low back pain with flexion and lateral bending  Neurological: She is alert and oriented to person, place, and time. No cranial nerve deficit.  Skin: Skin is warm and dry.  Psychiatric: She has a normal mood and affect. Her behavior is normal. Judgment and thought content normal.  Mild anxiety          Assessment & Plan:  Sciatica 1. Sterapred and Baclofen as prescribed. 2. Check blood pressure while on prednisone.  If bp become elevated, stop and take Ibuprofen 800 mg every 8 hours for 3 days. 3. Back exercises.  Attempt exercises, increase tolerance each time. 4. Go to ER if increasing back/leg pain, weakness or loss of bowel or bladder function. 5. Follow up eith PCP as needed. Patient education provided with sample back exercises. Meds ordered this encounter  Medications  . predniSONE (STERAPRED UNI-PAK 21 TAB) 10 MG (21) TBPK tablet    Sig: Take as directed.    Dispense:  21 tablet    Refill:  0    Order Specific Question:   Supervising Provider    Answer:   Stacie GlazeJENKINS, JOHN E [[id number]]  . baclofen (LIORESAL) 10 MG tablet    Sig: Take 1 tablet (10 mg total) by mouth 3 (three) times daily for 10 days.    Dispense:  30 each    Refill:  0    Order Specific Question:   Supervising Provider    Answer:   Stacie GlazeJENKINS, JOHN E [phone][[id number]]

## 2017-05-05 NOTE — Patient Instructions (Signed)
Sciatica Sciatica is pain, numbness, weakness, or tingling along the path of the sciatic nerve. The sciatic nerve starts in the lower back and runs down the back of each leg. The nerve controls the muscles in the lower leg and in the back of the knee. It also provides feeling (sensation) to the back of the thigh, the lower leg, and the sole of the foot. Sciatica is a symptom of another medical condition that pinches or puts pressure on the sciatic nerve. Generally, sciatica only affects one side of the body. Sciatica usually goes away on its own or with treatment. In some cases, sciatica may keep coming back (recur). What are the causes? This condition is caused by pressure on the sciatic nerve, or pinching of the sciatic nerve. This may be the result of:  A disk in between the bones of the spine (vertebrae) bulging out too far (herniated disk).  Age-related changes in the spinal disks (degenerative disk disease).  A pain disorder that affects a muscle in the buttock (piriformis syndrome).  Extra bone growth (bone spur) near the sciatic nerve.  An injury or break (fracture) of the pelvis.  Pregnancy.  Tumor (rare). What increases the risk? The following factors may make you more likely to develop this condition:  Playing sports that place pressure or stress on the spine, such as football or weight lifting.  Having poor strength and flexibility.  A history of back injury.  A history of back surgery.  Sitting for long periods of time.  Doing activities that involve repetitive bending or lifting.  Obesity. What are the signs or symptoms? Symptoms can vary from mild to very severe, and they may include:  Any of these problems in the lower back, leg, hip, or buttock:  Mild tingling or dull aches.  Burning sensations.  Sharp pains.  Numbness in the back of the calf or the sole of the foot.  Leg weakness.  Severe back pain that makes movement difficult. These symptoms may  get worse when you cough, sneeze, or laugh, or when you sit or stand for long periods of time. Being overweight may also make symptoms worse. In some cases, symptoms may recur over time. How is this diagnosed? This condition may be diagnosed based on:  Your symptoms.  A physical exam. Your health care provider may ask you to do certain movements to check whether those movements trigger your symptoms.  You may have tests, including:  Blood tests.  X-rays.  MRI.  CT scan. How is this treated? In many cases, this condition improves on its own, without any treatment. However, treatment may include:  Reducing or modifying physical activity during periods of pain.  Exercising and stretching to strengthen your abdomen and improve the flexibility of your spine.  Icing and applying heat to the affected area.  Medicines that help:  To relieve pain and swelling.  To relax your muscles.  Injections of medicines that help to relieve pain, irritation, and inflammation around the sciatic nerve (steroids).  Surgery. Follow these instructions at home: Medicines   Take over-the-counter and prescription medicines only as told by your health care provider.  Do not drive or operate heavy machinery while taking prescription pain medicine. Managing pain   If directed, apply ice to the affected area.  Put ice in a plastic bag.  Place a towel between your skin and the bag.  Leave the ice on for 20 minutes, 2-3 times a day.  After icing, apply heat to the   heat to the affected area before you exercise or as often as told by your health care provider. Use the heat source that your health care provider recommends, such as a moist heat pack or a heating pad. ? Place a towel between your skin and the heat source. ? Leave the heat on for 20-30 minutes. ? Remove the heat if your skin turns bright red. This is especially important if you are unable to feel pain, heat, or cold. You may have a  greater risk of getting burned. Activity  Return to your normal activities as told by your health care provider. Ask your health care provider what activities are safe for you. ? Avoid activities that make your symptoms worse.  Take brief periods of rest throughout the day. Resting in a lying or standing position is usually better than sitting to rest. ? When you rest for longer periods, mix in some mild activity or stretching between periods of rest. This will help to prevent stiffness and pain. ? Avoid sitting for long periods of time without moving. Get up and move around at least one time each hour.  Exercise and stretch regularly, as told by your health care provider.  Do not lift anything that is heavier than 10 lb (4.5 kg) while you have symptoms of sciatica. When you do not have symptoms, you should still avoid heavy lifting, especially repetitive heavy lifting.  When you lift objects, always use proper lifting technique, which includes: ? Bending your knees. ? Keeping the load close to your body. ? Avoiding twisting. General instructions  Use good posture. ? Avoid leaning forward while sitting. ? Avoid hunching over while standing.  Maintain a healthy weight. Excess weight puts extra stress on your back and makes it difficult to maintain good posture.  Wear supportive, comfortable shoes. Avoid wearing high heels.  Avoid sleeping on a mattress that is too soft or too hard. A mattress that is firm enough to support your back when you sleep may help to reduce your pain.  Keep all follow-up visits as told by your health care provider. This is important.  Take Prednisone as directed. Use Tylenol Extra Strength as directed for back pain.  Check blood pressure daily.  If blood pressure becomes elevated, stop Prednisone.  Start Ibuprofen 800mg  every 8 hours for 3 days if unable to continue Prednisone. Contact a health care provider if:  You have pain that wakes you up when you are  sleeping.  You have pain that gets worse when you lie down.  Your pain is worse than you have experienced in the past.  Your pain lasts longer than 4 weeks.  You experience unexplained weight loss. Get help right away if:  You lose control of your bowel or bladder (incontinence).  You have: ? Weakness in your lower back, pelvis, buttocks, or legs that gets worse. ? Redness or swelling of your back. ? A burning sensation when you urinate. This information is not intended to replace advice given to you by your health care provider. Make sure you discuss any questions you have with your health care provider. Document Released: 01/19/2001 Document Revised: 07/01/2015 Document Reviewed: 10/04/2014 Elsevier Interactive Patient Education  2018 [employer]  Back Exercises The following exercises strengthen the muscles that help to support the back. They also help to keep the lower back flexible. Doing these exercises can help to prevent back pain or lessen existing pain. If you have back pain or discomfort, try doing these exercises  2-3 times each day or as told by your health care provider. When the pain goes away, do them once each day, but increase the number of times that you repeat the steps for each exercise (do more repetitions). If you do not have back pain or discomfort, do these exercises once each day or as told by your health care provider. Exercises Single Knee to Chest  Repeat these steps 3-5 times for each leg: 1. Lie on your back on a firm bed or the floor with your legs extended. 2. Bring one knee to your chest. Your other leg should stay extended and in contact with the floor. 3. Hold your knee in place by grabbing your knee or thigh. 4. Pull on your knee until you feel a gentle stretch in your lower back. 5. Hold the stretch for 10-30 seconds. 6. Slowly release and straighten your leg.  Pelvic Tilt  Repeat these steps 5-10 times: 1. Lie on your back on a firm bed  or the floor with your legs extended. 2. Bend your knees so they are pointing toward the ceiling and your feet are flat on the floor. 3. Tighten your lower abdominal muscles to press your lower back against the floor. This motion will tilt your pelvis so your tailbone points up toward the ceiling instead of pointing to your feet or the floor. 4. With gentle tension and even breathing, hold this position for 5-10 seconds.  Cat-Cow  Repeat these steps until your lower back becomes more flexible: 1. Get into a hands-and-knees position on a firm surface. Keep your hands under your shoulders, and keep your knees under your hips. You may place padding under your knees for comfort. 2. Let your head hang down, and point your tailbone toward the floor so your lower back becomes rounded like the back of a cat. 3. Hold this position for 5 seconds. 4. Slowly lift your head and point your tailbone up toward the ceiling so your back forms a sagging arch like the back of a cow. 5. Hold this position for 5 seconds.  Press-Ups  Repeat these steps 5-10 times: 1. Lie on your abdomen (face-down) on the floor. 2. Place your palms near your head, about shoulder-width apart. 3. While you keep your back as relaxed as possible and keep your hips on the floor, slowly straighten your arms to raise the top half of your body and lift your shoulders. Do not use your back muscles to raise your upper torso. You may adjust the placement of your hands to make yourself more comfortable. 4. Hold this position for 5 seconds while you keep your back relaxed. 5. Slowly return to lying flat on the floor.  Bridges  Repeat these steps 10 times: 1. Lie on your back on a firm surface. 2. Bend your knees so they are pointing toward the ceiling and your feet are flat on the floor. 3. Tighten your buttocks muscles and lift your buttocks off of the floor until your waist is at almost the same height as your knees. You should feel the  muscles working in your buttocks and the back of your thighs. If you do not feel these muscles, slide your feet 1-2 inches farther away from your buttocks. 4. Hold this position for 3-5 seconds. 5. Slowly lower your hips to the starting position, and allow your buttocks muscles to relax completely.  If this exercise is too easy, try doing it with your arms crossed over your chest. Abdominal Crunches  Repeat these steps 5-10 times: 1. Lie on your back on a firm bed or the floor with your legs extended. 2. Bend your knees so they are pointing toward the ceiling and your feet are flat on the floor. 3. Cross your arms over your chest. 4. Tip your chin slightly toward your chest without bending your neck. 5. Tighten your abdominal muscles and slowly raise your trunk (torso) high enough to lift your shoulder blades a tiny bit off of the floor. Avoid raising your torso higher than that, because it can put too much stress on your low back and it does not help to strengthen your abdominal muscles. 6. Slowly return to your starting position.  Back Lifts Repeat these steps 5-10 times: 1. Lie on your abdomen (face-down) with your arms at your sides, and rest your forehead on the floor. 2. Tighten the muscles in your legs and your buttocks. 3. Slowly lift your chest off of the floor while you keep your hips pressed to the floor. Keep the back of your head in line with the curve in your back. Your eyes should be looking at the floor. 4. Hold this position for 3-5 seconds. 5. Slowly return to your starting position.  Contact a health care provider if:  Your back pain or discomfort gets much worse when you do an exercise.  Your back pain or discomfort does not lessen within 2 hours after you exercise. If you have any of these problems, stop doing these exercises right away. Do not do them again unless your health care provider says that you can. Get help right away if:  You develop sudden, severe back  pain. If this happens, stop doing the exercises right away. Do not do them again unless your health care provider says that you can. This information is not intended to replace advice given to you by your health care provider. Make sure you discuss any questions you have with your health care provider. Document Released: 03/04/2004 Document Revised: 06/04/2015 Document Reviewed: 03/21/2014 Elsevier Interactive Patient Education  2017 [employer]

## 2017-05-06 MED FILL — BACLOFEN 10 MG TABLET: 10 | 10 days supply | Qty: 30 | Fill #0

## 2017-05-06 MED FILL — predniSONE 10 MG (21) TBPK: 10 | 6 days supply | Qty: 21 | Fill #0

## 2017-05-10 ENCOUNTER — Ambulatory Visit (INDEPENDENT_AMBULATORY_CARE_PROVIDER_SITE_OTHER): Payer: Self-pay | Admitting: Emergency Medicine

## 2017-05-10 DIAGNOSIS — Z111 Encounter for screening for respiratory tuberculosis: Secondary | ICD-10-CM

## 2017-05-12 ENCOUNTER — Telehealth: Payer: Self-pay

## 2017-05-12 LAB — TB SKIN TEST
Induration: 0 mm
TB Skin Test: NEGATIVE

## 2017-05-12 NOTE — Telephone Encounter (Signed)
Called and spoke with pt and she is doing much better.

## 2017-05-12 NOTE — Progress Notes (Signed)
Came in for PPD reading today. Read by me as negative-0 mm  

## 2017-05-23 MED FILL — OLMSRTN-AMLDPN-HCTZ 40-5-25: 40-5-25 | 30 days supply | Qty: 30 | Fill #3

## 2017-05-27 ENCOUNTER — Ambulatory Visit: Payer: Self-pay

## 2017-05-27 ENCOUNTER — Telehealth: Payer: Self-pay | Admitting: Adult Health

## 2017-05-27 ENCOUNTER — Ambulatory Visit: Payer: 59 | Admitting: Urgent Care

## 2017-05-27 ENCOUNTER — Encounter: Payer: Self-pay | Admitting: Urgent Care

## 2017-05-27 VITALS — BP 145/81 | HR 73 | Temp 98.3°F | Resp 18 | Ht 68.0 in | Wt 179.2 lb

## 2017-05-27 DIAGNOSIS — M5432 Sciatica, left side: Secondary | ICD-10-CM | POA: Diagnosis not present

## 2017-05-27 DIAGNOSIS — G44209 Tension-type headache, unspecified, not intractable: Secondary | ICD-10-CM | POA: Diagnosis not present

## 2017-05-27 DIAGNOSIS — R51 Headache: Secondary | ICD-10-CM | POA: Diagnosis not present

## 2017-05-27 DIAGNOSIS — Z9109 Other allergy status, other than to drugs and biological substances: Secondary | ICD-10-CM | POA: Diagnosis not present

## 2017-05-27 DIAGNOSIS — F411 Generalized anxiety disorder: Secondary | ICD-10-CM | POA: Diagnosis not present

## 2017-05-27 DIAGNOSIS — R519 Headache, unspecified: Secondary | ICD-10-CM

## 2017-05-27 MED ORDER — LORATADINE 10 MG PO TABS: 10.0000 mg | ORAL_TABLET | Freq: Every day | ORAL | 11 refills | Status: DC

## 2017-05-27 MED ORDER — CYCLOBENZAPRINE HCL 5 MG PO TABS: 5.0000 mg | ORAL_TABLET | Freq: Three times a day (TID) | ORAL | 1 refills | Status: DC | PRN

## 2017-05-27 MED ORDER — FLUTICASONE PROPIONATE 50 MCG/ACT NA SUSP: 2.0000 | Freq: Every day | NASAL | 6 refills | Status: DC

## 2017-05-27 MED ORDER — CETIRIZINE HCL 10 MG PO TABS: 10.0000 mg | ORAL_TABLET | Freq: Every day | ORAL | 11 refills | Status: DC

## 2017-05-27 MED ORDER — PREDNISONE 20 MG PO TABS: ORAL_TABLET | ORAL | 0 refills | Status: DC

## 2017-05-27 MED FILL — FLUTICASONE PROP 50 MCG SPR: 50 | 30 days supply | Qty: 16 | Fill #0

## 2017-05-27 MED FILL — predniSONE 20 MG TABS: 20 | 5 days supply | Qty: 10 | Fill #0

## 2017-05-27 MED FILL — CYCLOBENZAPRINE 5 MG TABLET: 5 | 30 days supply | Qty: 90 | Fill #0

## 2017-05-27 NOTE — Telephone Encounter (Signed)
Pt. Reports she has a headache this week "with just a little dizziness or unsteadiness.I just don't feel right." States she does take BP medication. BP 152/95 "That's good for me." States "maybe it's my allergies or a little anxiety - I don't know." Appointment made today as requested. Reason for Disposition . [1] MODERATE headache (e.g., interferes with normal activities) AND [2] present > 24 hours AND [3] unexplained  (Exceptions: analgesics not tried, typical migraine, or headache part of viral illness)  Answer Assessment - Initial Assessment Questions 1. LOCATION: "Where does it hurt?"      Front, right side behind her eyes 2. ONSET: "When did the headache start?" (Minutes, hours or days)      Started  1 wek ago 3. PATTERN: "Does the pain come and go, or has it been constant since it started?"      Comes and goes 4. SEVERITY: "How bad is the pain?" and "What does it keep you from doing?"  (e.g., Scale 1-10; mild, moderate, or severe)   - MILD (1-3): doesn't interfere with normal activities    - MODERATE (4-7): interferes with normal activities or awakens from sleep    - SEVERE (8-10): excruciating pain, unable to do any normal activities        4 5. RECURRENT SYMPTOM: "Have you ever had headaches before?" If so, ask: "When was the last time?" and "What happened that time?"      N0 6. CAUSE: "What do you think is causing the headache?"     Maybe my BP or anxiety 7. MIGRAINE: "Have you been diagnosed with migraine headaches?" If so, ask: "Is this headache similar?"      No 8. HEAD INJURY: "Has there been any recent injury to the head?"      N0 9. OTHER SYMPTOMS: "Do you have any other symptoms?" (fever, stiff neck, eye pain, sore throat, cold symptoms)     A little dizziness 10. PREGNANCY: "Is there any chance you are pregnant?" "When was your last menstrual period?"       No  Protocols used: HEADACHE-A-AH

## 2017-05-27 NOTE — Telephone Encounter (Signed)
Copied from CRM [phone]. Topic: General - Other >> May 27, 2017  9:55 AM Maia Pettiesrtiz, Kristie S wrote: Reason for CRM: pt called feeling dizzy with headache intermittently for 2 days. Pt [facility] [employer]. She is going to have someone check her BP and call us back. She is on BP medications.  >> May 27, 2017 10:04 AM Rudi CocoLathan, Quincee Gittens M, NT wrote: Pt. Called back b/p 152/95

## 2017-05-27 NOTE — Progress Notes (Signed)
    MRN: [id number] DOB: 02/22/1970  Subjective:   Valerie Gilmore is a 47 y.o. female presenting for 1 month history of persistent right facial/headache pain, right neck tensions/pain. States that she feels like she is having spasms. Has occasional nasal congestion, occasional tingling in hands and feet. Denies photophobia, weakness, dizziness, confusion, tinnitus, ear fullness. Of note, patient was started on Strattera for ADD ~1 month ago. Patient hydrates well with water. Sleeps ~6 hours per night. Does not drink coffee. Denies alcohol use. Patient is currently taking prednisone for sciatica. She has been on this for 2 weeks at 10 mg/day.  Patient admits that she has a lot of stressors right now including one full-time job and also runs a [employer].  She also takes care of her parents were not in good health, patient's mother recently had a stroke.    Valerie Gilmore has a current medication list which includes the following prescription(s): atomoxetine, fluticasone, lansoprazole, olmesartan-amlodipine-hctz, prednisone, and sertraline. Also is allergic to other.  Valerie Gilmore  has a past medical history of Depression and Hypertension. Also  has a past surgical history that includes Abdominal hysterectomy (2011).  Objective:   Vitals: BP (!) 145/81   Pulse 73   Temp 98.3 F (36.8 C) (Oral)   Resp 18   Ht 5\' 8"  (1.727 m)   Wt 179 lb 3.2 oz (81.3 kg)   SpO2 97%   BMI 27.25 kg/m   Physical Exam  Constitutional: She is oriented to person, place, and time. She appears well-developed and well-nourished.  HENT:  Right Ear: Tympanic membrane and external ear normal.  Left Ear: Tympanic membrane and external ear normal.  Mouth/Throat: Oropharynx is clear and moist.  Eyes: Pupils are equal, round, and reactive to light. EOM are normal. Right eye exhibits no discharge. Left eye exhibits no discharge. No scleral icterus.  Neck: Normal range of motion. Neck supple. No thyromegaly present.  Cardiovascular: Normal  rate, regular rhythm and intact distal pulses. Exam reveals no gallop and no friction rub.  No murmur heard. Pulmonary/Chest: No respiratory distress. She has no wheezes. She has no rales.  Musculoskeletal: She exhibits no edema or tenderness.  Neurological: She is alert and oriented to person, place, and time. She displays normal reflexes. No cranial nerve deficit. Coordination normal.  Negative Romberg and pronator drift.  Speech intact.  Heel-to-shin test and finger-nose test normal.  Balance intact.  Skin: Skin is warm and dry.  Psychiatric:  Patient appears lethargic and has flat affect.    Assessment and Plan :   Generalized anxiety disorder  Tension headache  Generalized headache  Sciatica of left side  Environmental allergies - Plan: fluticasone (FLONASE) 50 MCG/ACT nasal spray  Counseled patient that she is having tension type headaches from stressors including insomnia, at work, being a caregiver, eating 1 meal per day. Emphasized importance of self-care for patient.  However I did offer patient prednisone course to address her sciatic nerve pain.  Also refilled her Flonase and will have her start Zyrtec and Claritin for allergies.  Patient was concerned about a stroke, reassured her given her normal neurologic exam.  Return to clinic precautions discussed.  Otherwise patient can follow-up with her PCP.  Wallis BambergMario Dellar Traber, PA-C [facility] [phone] 05/27/2017  5:05 PM

## 2017-05-27 NOTE — Patient Instructions (Addendum)
You may try Excedrin for migraines for headaches.     General Headache Without Cause A headache is pain or discomfort felt around the head or neck area. There are many causes and types of headaches. In some cases, the cause may not be found. Follow these instructions at home: Managing pain  Take over-the-counter and prescription medicines only as told by your doctor.  Lie down in a dark, quiet room when you have a headache.  If directed, apply ice to the head and neck area: ? Put ice in a plastic bag. ? Place a towel between your skin and the bag. ? Leave the ice on for 20 minutes, 2-3 times per day.  Use a heating pad or hot shower to apply heat to the head and neck area as told by your doctor.  Keep lights dim if bright lights bother you or make your headaches worse. Eating and drinking  Eat meals on a regular schedule.  Lessen how much alcohol you drink.  Lessen how much caffeine you drink, or stop drinking caffeine. General instructions  Keep all follow-up visits as told by your doctor. This is important.  Keep a journal to find out if certain things bring on headaches. For example, write down: ? What you eat and drink. ? How much sleep you get. ? Any change to your diet or medicines.  Relax by getting a massage or doing other relaxing activities.  Lessen stress.  Sit up straight. Do not tighten (tense) your muscles.  Do not use tobacco products. This includes cigarettes, chewing tobacco, or e-cigarettes. If you need help quitting, ask your doctor.  Exercise regularly as told by your doctor.  Get enough sleep. This often means 7-9 hours of sleep. Contact a doctor if:  Your symptoms are not helped by medicine.  You have a headache that feels different than the other headaches.  You feel sick to your stomach (nauseous) or you throw up (vomit).  You have a fever. Get help right away if:  Your headache becomes really bad.  You keep throwing up.  You  have a stiff neck.  You have trouble seeing.  You have trouble speaking.  You have pain in the eye or ear.  Your muscles are weak or you lose muscle control.  You lose your balance or have trouble walking.  You feel like you will pass out (faint) or you pass out.  You have confusion. This information is not intended to replace advice given to you by your health care provider. Make sure you discuss any questions you have with your health care provider. Document Released: 11/04/2007 Document Revised: 07/03/2015 Document Reviewed: 05/20/2014 Elsevier Interactive Patient Education  2018 [place] Maintenance, Female Adopting a healthy lifestyle and getting preventive care can go a long way to promote health and wellness. Talk with your health care provider about what schedule of regular examinations is right for you. This is a good chance for you to check in with your provider about disease prevention and staying healthy. In between checkups, there are plenty of things you can do on your own. Experts have done a lot of research about which lifestyle changes and preventive measures are most likely to keep you healthy. Ask your health care provider for more information. Weight and diet Eat a healthy diet  Be sure to include plenty of vegetables, fruits, low-fat dairy products, and lean protein.  Do not eat a lot of foods high in  solid fats, added sugars, or salt.  Get regular exercise. This is one of the most important things you can do for your health. ? Most adults should exercise for at least 150 minutes each week. The exercise should increase your heart rate and make you sweat (moderate-intensity exercise). ? Most adults should also do strengthening exercises at least twice a week. This is in addition to the moderate-intensity exercise.  Maintain a healthy weight  Body mass index (BMI) is a measurement that can be used to identify possible weight problems. It  estimates body fat based on height and weight. Your health care provider can help determine your BMI and help you achieve or maintain a healthy weight.  For females 50 years of age and older: ? A BMI below 18.5 is considered underweight. ? A BMI of 18.5 to 24.9 is normal. ? A BMI of 25 to 29.9 is considered overweight. ? A BMI of 30 and above is considered obese.  Watch levels of cholesterol and blood lipids  You should start having your blood tested for lipids and cholesterol at 45 years of age, then have this test every 5 years.  You may need to have your cholesterol levels checked more often if: ? Your lipid or cholesterol levels are high. ? You are older than 45 years of age. ? You are at high risk for heart disease.  Cancer screening Lung Cancer  Lung cancer screening is recommended for adults 75-22 years old who are at high risk for lung cancer because of a history of smoking.  A yearly low-dose CT scan of the lungs is recommended for people who: ? Currently smoke. ? Have quit within the past 15 years. ? Have at least a 30-pack-year history of smoking. A pack year is smoking an average of one pack of cigarettes a day for 1 year.  Yearly screening should continue until it has been 15 years since you quit.  Yearly screening should stop if you develop a health problem that would prevent you from having lung cancer treatment.  Breast Cancer  Practice breast self-awareness. This means understanding how your breasts normally appear and feel.  It also means doing regular breast self-exams. Let your health care provider know about any changes, no matter how small.  If you are in your 20s or 30s, you should have a clinical breast exam (CBE) by a health care provider every 1-3 years as part of a regular health exam.  If you are 50 or older, have a CBE every year. Also consider having a breast X-ray (mammogram) every year.  If you have a family history of breast cancer, talk to  your health care provider about genetic screening.  If you are at high risk for breast cancer, talk to your health care provider about having an MRI and a mammogram every year.  Breast cancer gene (BRCA) assessment is recommended for women who have family members with BRCA-related cancers. BRCA-related cancers include: ? Breast. ? Ovarian. ? Tubal. ? Peritoneal cancers.  Results of the assessment will determine the need for genetic counseling and BRCA1 and BRCA2 testing.  Cervical Cancer Your health care provider may recommend that you be screened regularly for cancer of the pelvic organs (ovaries, uterus, and vagina). This screening involves a pelvic examination, including checking for microscopic changes to the surface of your cervix (Pap test). You may be encouraged to have this screening done every 3 years, beginning at age 75.  For women ages 61-65, health care  providers may recommend pelvic exams and Pap testing every 3 years, or they may recommend the Pap and pelvic exam, combined with testing for human papilloma virus (HPV), every 5 years. Some types of HPV increase your risk of cervical cancer. Testing for HPV may also be done on women of any age with unclear Pap test results.  Other health care providers may not recommend any screening for nonpregnant women who are considered low risk for pelvic cancer and who do not have symptoms. Ask your health care provider if a screening pelvic exam is right for you.  If you have had past treatment for cervical cancer or a condition that could lead to cancer, you need Pap tests and screening for cancer for at least 20 years after your treatment. If Pap tests have been discontinued, your risk factors (such as having a new sexual partner) need to be reassessed to determine if screening should resume. Some women have medical problems that increase the chance of getting cervical cancer. In these cases, your health care provider may recommend more  frequent screening and Pap tests.  Colorectal Cancer  This type of cancer can be detected and often prevented.  Routine colorectal cancer screening usually begins at 45 years of age and continues through 45 years of age.  Your health care provider may recommend screening at an earlier age if you have risk factors for colon cancer.  Your health care provider may also recommend using home test kits to check for hidden blood in the stool.  A small camera at the end of a tube can be used to examine your colon directly (sigmoidoscopy or colonoscopy). This is done to check for the earliest forms of colorectal cancer.  Routine screening usually begins at age 48.  Direct examination of the colon should be repeated every 5-10 years through 45 years of age. However, you may need to be screened more often if early forms of precancerous polyps or small growths are found.  Skin Cancer  Check your skin from head to toe regularly.  Tell your health care provider about any new moles or changes in moles, especially if there is a change in a mole's shape or color.  Also tell your health care provider if you have a mole that is larger than the size of a pencil eraser.  Always use sunscreen. Apply sunscreen liberally and repeatedly throughout the day.  Protect yourself by wearing long sleeves, pants, a wide-brimmed hat, and sunglasses whenever you are outside.  Heart disease, diabetes, and high blood pressure  High blood pressure causes heart disease and increases the risk of stroke. High blood pressure is more likely to develop in: ? People who have blood pressure in the high end of the normal range (130-139/85-89 mm Hg). ? People who are overweight or obese. ? People who are African American.  If you are 35-5 years of age, have your blood pressure checked every 3-5 years. If you are 39 years of age or older, have your blood pressure checked every year. You should have your blood pressure measured  twice-once when you are at a hospital or clinic, and once when you are not at a hospital or clinic. Record the average of the two measurements. To check your blood pressure when you are not at a hospital or clinic, you can use: ? An automated blood pressure machine at a pharmacy. ? A home blood pressure monitor.  If you are between 35 years and 41 years old, ask your  health care provider if you should take aspirin to prevent strokes.  Have regular diabetes screenings. This involves taking a blood sample to check your fasting blood sugar level. ? If you are at a normal weight and have a low risk for diabetes, have this test once every three years after 45 years of age. ? If you are overweight and have a high risk for diabetes, consider being tested at a younger age or more often. Preventing infection Hepatitis B  If you have a higher risk for hepatitis B, you should be screened for this virus. You are considered at high risk for hepatitis B if: ? You were born in a country where hepatitis B is common. Ask your health care provider which countries are considered high risk. ? Your parents were born in a high-risk country, and you have not been immunized against hepatitis B (hepatitis B vaccine). ? You have HIV or AIDS. ? You use needles to inject street drugs. ? You live with someone who has hepatitis B. ? You have had sex with someone who has hepatitis B. ? You get hemodialysis treatment. ? You take certain medicines for conditions, including cancer, organ transplantation, and autoimmune conditions.  Hepatitis C  Blood testing is recommended for: ? Everyone born from [birth year] through 1963. ? Anyone with known risk factors for hepatitis C.  Sexually transmitted infections (STIs)  You should be screened for sexually transmitted infections (STIs) including gonorrhea and chlamydia if: ? You are sexually active and are younger than 45 years of age. ? You are older than 45 years of age and your  health care provider tells you that you are at risk for this type of infection. ? Your sexual activity has changed since you were last screened and you are at an increased risk for chlamydia or gonorrhea. Ask your health care provider if you are at risk.  If you do not have HIV, but are at risk, it may be recommended that you take a prescription medicine daily to prevent HIV infection. This is called pre-exposure prophylaxis (PrEP). You are considered at risk if: ? You are sexually active and do not regularly use condoms or know the HIV status of your partner(s). ? You take drugs by injection. ? You are sexually active with a partner who has HIV.  Talk with your health care provider about whether you are at high risk of being infected with HIV. If you choose to begin PrEP, you should first be tested for HIV. You should then be tested every 3 months for as long as you are taking PrEP. Pregnancy  If you are premenopausal and you may become pregnant, ask your health care provider about preconception counseling.  If you may become pregnant, take 400 to 800 micrograms (mcg) of folic acid every day.  If you want to prevent pregnancy, talk to your health care provider about birth control (contraception). Osteoporosis and menopause  Osteoporosis is a disease in which the bones lose minerals and strength with aging. This can result in serious bone fractures. Your risk for osteoporosis can be identified using a bone density scan.  If you are 70 years of age or older, or if you are at risk for osteoporosis and fractures, ask your health care provider if you should be screened.  Ask your health care provider whether you should take a calcium or vitamin D supplement to lower your risk for osteoporosis.  Menopause may have certain physical symptoms and risks.  Hormone replacement therapy  may reduce some of these symptoms and risks. Talk to your health care provider about whether hormone replacement  therapy is right for you. Follow these instructions at home:  Schedule regular health, dental, and eye exams.  Stay current with your immunizations.  Do not use any tobacco products including cigarettes, chewing tobacco, or electronic cigarettes.  If you are pregnant, do not drink alcohol.  If you are breastfeeding, limit how much and how often you drink alcohol.  Limit alcohol intake to no more than 1 drink per day for nonpregnant women. One drink equals 12 ounces of beer, 5 ounces of wine, or 1 ounces of hard liquor.  Do not use street drugs.  Do not share needles.  Ask your health care provider for help if you need support or information about quitting drugs.  Tell your health care provider if you often feel depressed.  Tell your health care provider if you have ever been abused or do not feel safe at home. This information is not intended to replace advice given to you by your health care provider. Make sure you discuss any questions you have with your health care provider. Document Released: 08/10/2010 Document Revised: 07/03/2015 Document Reviewed: 10/29/2014 Elsevier Interactive Patient Education  2018 [employer].     IF you received an x-ray today, you will receive an invoice from [facility]. Please contact [facility] at [phone] with questions or concerns regarding your invoice.   IF you received labwork today, you will receive an invoice from [facility]. Please contact [facility] at [phone] with questions or concerns regarding your invoice.   Our billing staff will not be able to assist you with questions regarding bills from these companies.  You will be contacted with the lab results as soon as they are available. The fastest way to get your results is to activate your My Chart account. Instructions are located on the last page of this paperwork. If you have not heard from [place] regarding the results in 2 weeks, please contact this office.

## 2017-06-17 ENCOUNTER — Encounter: Payer: Self-pay | Admitting: Family Medicine

## 2017-06-17 ENCOUNTER — Ambulatory Visit: Payer: 59 | Admitting: Adult Health

## 2017-06-17 ENCOUNTER — Encounter: Payer: Self-pay | Admitting: Adult Health

## 2017-06-17 VITALS — BP 118/80 | Temp 98.4°F | Wt 181.0 lb

## 2017-06-17 DIAGNOSIS — G43111 Migraine with aura, intractable, with status migrainosus: Secondary | ICD-10-CM | POA: Diagnosis not present

## 2017-06-17 DIAGNOSIS — M5432 Sciatica, left side: Secondary | ICD-10-CM

## 2017-06-17 LAB — BASIC METABOLIC PANEL
BUN: 15 mg/dL (ref 6–23)
CALCIUM: 9.3 mg/dL (ref 8.4–10.5)
CO2: 28 mEq/L (ref 19–32)
Chloride: 102 mEq/L (ref 96–112)
Creatinine, Ser: 0.77 mg/dL (ref 0.40–1.20)
GFR: 104.53 mL/min (ref >60.00)
Glucose, Bld: 118 mg/dL — ABNORMAL HIGH (ref 70–99)
Potassium: 3.8 mEq/L (ref 3.5–5.1)
Sodium: 138 mEq/L (ref 135–145)

## 2017-06-17 LAB — CBC WITH DIFFERENTIAL/PLATELET
Basophils Absolute: 0 10*3/uL (ref 0.0–0.1)
Basophils Relative: 0.5 % (ref 0.0–3.0)
EOS PCT: 0.8 % (ref 0.0–5.0)
Eosinophils Absolute: 0 10*3/uL (ref 0.0–0.7)
HCT: 40.3 % (ref 36.0–46.0)
Hemoglobin: 13.5 g/dL (ref 12.0–15.0)
LYMPHS ABS: 2.5 10*3/uL (ref 0.7–4.0)
Lymphocytes Relative: 48.7 % — ABNORMAL HIGH (ref 12.0–46.0)
MCHC: 33.4 g/dL (ref 30.0–36.0)
MCV: 83.5 fl (ref 78.0–100.0)
MONO ABS: 0.5 10*3/uL (ref 0.1–1.0)
Monocytes Relative: 10.2 % (ref 3.0–12.0)
NEUTROS PCT: 39.8 % — AB (ref 43.0–77.0)
Neutro Abs: 2.1 10*3/uL (ref 1.4–7.7)
Platelets: 300 10*3/uL (ref 150.0–400.0)
RBC: 4.83 Mil/uL (ref 3.87–5.11)
RDW: 14.4 % (ref 11.5–15.5)
WBC: 5.2 10*3/uL (ref 4.0–10.5)

## 2017-06-17 MED ORDER — AMITRIPTYLINE HCL 25 MG PO TABS: 25.0000 mg | ORAL_TABLET | Freq: Every day | ORAL | 2 refills | Status: DC

## 2017-06-17 MED FILL — OLMSRTN-AMLDPN-HCTZ 40-5-25: 40-5-25 | 30 days supply | Qty: 30 | Fill #4

## 2017-06-17 MED FILL — AMITRIPTYLINE HCL 25 MG TAB: 25 | 30 days supply | Qty: 30 | Fill #0

## 2017-06-17 NOTE — Progress Notes (Signed)
Subjective:    Patient ID: Valerie Gilmore, female    DOB: [birth year], 47 y.o.   MRN: [id number]  HPI  47 year old female who  has a past medical history of Depression and Hypertension.  She presents to the office today with the acute on chronic complaints of sciatica and headaches.   Sciatica - has been seen numerous times over the last six months for this issue. She is usually prescribed prednisone, which she reports helps with her pain while she is taking it. Within two weeks of finishing steroids, her pain returns. Sciatica pain is mainly on the left side.   Headaches - Has been on and off for three months. Mostly on right side. Endorses floaters. Started after being placed on Strattera. She has not taken this medication in the last 2 weeks and headaches have improved.She denies any n/v/d, light sensitivity, or photo sensitivity. She has not had a recent eye exam.   She has a lot going on right now, she works full time, runs a Chemical engineer, is going to school, and taking care of her parents.   Review of Systems  See HPI   Past Medical History:  Diagnosis Date  . Depression   . Hypertension    high blood pressure readings     Social History   Socioeconomic History  . Marital status: Single    Spouse name: Not on file  . Number of children: Not on file  . Years of education: Not on file  . Highest education level: Not on file  Occupational History  . Not on file  Social Needs  . Financial resource strain: Not on file  . Food insecurity:    Worry: Not on file    Inability: Not on file  . Transportation needs:    Medical: Not on file    Non-medical: Not on file  Tobacco Use  . Smoking status: Never Smoker  . Smokeless tobacco: Never Used  Substance and Sexual Activity  . Alcohol use: No    Alcohol/week: 0.0 oz  . Drug use: No  . Sexual activity: Not on file  Lifestyle  . Physical activity:    Days per week: Not on file    Minutes per session: Not on file  . Stress:  Not on file  Relationships  . Social connections:    Talks on phone: Not on file    Gets together: Not on file    Attends religious service: Not on file    Active member of club or organization: Not on file    Attends meetings of clubs or organizations: Not on file    Relationship status: Not on file  . Intimate partner violence:    Fear of current or ex partner: Not on file    Emotionally abused: Not on file    Physically abused: Not on file    Forced sexual activity: Not on file  Other Topics Concern  . Not on file  Social History Narrative  . Not on file    Past Surgical History:  Procedure Laterality Date  . ABDOMINAL HYSTERECTOMY  2011    Family History  Problem Relation Age of Onset  . Breast cancer Paternal Grandmother   . Prostate cancer Father   . Hypertension Unknown        both sides     Allergies  Allergen Reactions  . Other Rash    Powder gloves cause a rash    Current Outpatient Medications on  File Prior to Visit  Medication Sig Dispense Refill  . cetirizine (ZYRTEC) 10 MG tablet Take 1 tablet (10 mg total) by mouth daily. 30 tablet 11  . cyclobenzaprine (FLEXERIL) 5 MG tablet Take 1 tablet (5 mg total) by mouth 3 (three) times daily as needed. 90 tablet 1  . fluticasone (FLONASE) 50 MCG/ACT nasal spray Place 2 sprays into both nostrils daily. 16 g 6  . lansoprazole (PREVACID) 15 MG capsule Take 1 capsule (15 mg total) by mouth daily at 12 noon. 90 capsule 3  . loratadine (CLARITIN) 10 MG tablet Take 1 tablet (10 mg total) by mouth daily. 30 tablet 11  . Olmesartan-Amlodipine-HCTZ 40-5-25 MG TABS Take 1 tablet by mouth daily. 90 tablet 3   No current facility-administered medications on file prior to visit.     BP 118/80 (BP Location: Left Arm)   Temp 98.4 F (36.9 C) (Oral)   Wt 181 lb (82.1 kg)   BMI 27.52 kg/m       Objective:   Physical Exam  Constitutional: She is oriented to person, place, and time. She appears well-developed and  well-nourished. No distress.  Eyes: Pupils are equal, round, and reactive to light. EOM are normal.  Neck: Normal range of motion. Neck supple.  Cardiovascular: Normal rate, regular rhythm, normal heart sounds and intact distal pulses. Exam reveals no gallop and no friction rub.  No murmur heard. Pulmonary/Chest: Effort normal and breath sounds normal.  Musculoskeletal: She exhibits edema (left sided sciatica ).  Neurological: She is alert and oriented to person, place, and time. She displays normal reflexes. No cranial nerve deficit or sensory deficit. She exhibits normal muscle tone. Coordination normal.  Skin: Skin is warm and dry. Capillary refill takes less than 2 seconds. No rash noted. She is not diaphoretic. No erythema. No pallor.  Psychiatric: She has a normal mood and affect. Her behavior is normal.  Nursing note and vitals reviewed.     Assessment & Plan:  1. Sciatica of left side - Will get MRI due to chronic nature.  - Consider PT or Neurosurgery consult, depending on MRI results.  - MR Lumbar Spine Wo Contrast; Future - amitriptyline (ELAVIL) 25 MG tablet; Take 1 tablet (25 mg total) by mouth at bedtime.  Dispense: 30 tablet; Refill: 2 - CBC with Differential/Platelet - Basic metabolic panel; Future - Basic metabolic panel  2. Intractable migraine with aura with status migrainosus - I think her migraines are stress related.  - Will trial Elavil.  - amitriptyline (ELAVIL) 25 MG tablet; Take 1 tablet (25 mg total) by mouth at bedtime.  Dispense: 30 tablet; Refill: 2 - CBC with Differential/Platelet - Basic metabolic panel; Future   Shirline Frees, NP

## 2017-06-22 ENCOUNTER — Telehealth: Payer: Self-pay | Admitting: Family Medicine

## 2017-06-22 MED ORDER — PREDNISONE 20 MG PO TABS: 20.0000 mg | ORAL_TABLET | Freq: Every day | ORAL | 0 refills | Status: DC

## 2017-06-22 MED FILL — predniSONE 20 MG TABS: 20 | 7 days supply | Qty: 7 | Fill #0

## 2017-06-22 NOTE — Telephone Encounter (Signed)
Spoke to the pt.  Medication sent to the pharmacy and she has been notified that approval is needed for MRI.  Pt reports she no longer has a headache.  Elavil has made her extremely sleepy.  She fell asleep at work today.  Is there another medication that she can try for leg pain and headache?  Pt notified Kandee Keen not back in the office until 06/23/17.

## 2017-06-22 NOTE — Telephone Encounter (Signed)
Tried to reach pt at number provided.  It is the hospital number.  Dialed #5 to reach an employee and received Designer, television/film set.  The operator was unable to locate pt.  Tried to reach her on cell but voicemail box has not been set up.  Will try again at a later time.

## 2017-06-22 NOTE — Telephone Encounter (Signed)
It shows on our end that we are waiting for her insurance to approve her MRI. I am ok with doing a course of prednisone 20 mg daily x 7 days until this gets approved.   Elavil can make people sleepy. She can try cutting it in half or we can switch medications

## 2017-06-22 NOTE — Telephone Encounter (Signed)
[phone]  Please call pt back before 3pm. Call the above number    If it after 3 pm call cell

## 2017-06-22 NOTE — Telephone Encounter (Signed)
Copied from CRM [id number]. Topic: General - Other >> Jun 22, 2017  1:12 PM Windy Kalata, NT wrote: Reason for CRM: patient is calling and states that she still has not received a call about her MRI for back and legs. She is still having pain in her left leg. She states amitriptyline (ELAVIL) 25 MG tablet is working for her headaches but not her legs. Also the medicine is making her very sleepy all through the day and she is taking the medicine at night between 8-9pm. She would like a call back on advise. She states she is unable to work due to the pain. She can be reached at [phone] until 3pm. After 3pm she can be reached at [phone]

## 2017-06-23 MED ORDER — TOPIRAMATE 25 MG PO TABS: 25.0000 mg | ORAL_TABLET | Freq: Every day | ORAL | 0 refills | Status: DC

## 2017-06-23 MED FILL — TOPIRAMATE 25 MG TABLET: 25 | 30 days supply | Qty: 30 | Fill #0

## 2017-06-23 NOTE — Telephone Encounter (Signed)
Pt notified to stop Elavil and start topamax 25 mg.  Sent to [facility].

## 2017-06-23 NOTE — Telephone Encounter (Signed)
We can try Topamax 25 mg daily.

## 2017-06-28 DIAGNOSIS — H52223 Regular astigmatism, bilateral: Secondary | ICD-10-CM | POA: Diagnosis not present

## 2017-06-28 DIAGNOSIS — H524 Presbyopia: Secondary | ICD-10-CM | POA: Diagnosis not present

## 2017-06-30 NOTE — Telephone Encounter (Signed)
Ok to send in prednisone 20 mg x 7 days

## 2017-06-30 NOTE — Telephone Encounter (Signed)
Pt called to state that she has the MRI scheduled for Tuesday but she is wanting to see if she can get some pills of predniSONE (DELTASONE) 20 MG tablet [[id number]]  To hold her thru for the weekend until her MRI on Tuesday, call pt to advise

## 2017-07-01 MED ORDER — PREDNISONE 20 MG PO TABS: 20.0000 mg | ORAL_TABLET | Freq: Every day | ORAL | 0 refills | Status: DC

## 2017-07-01 MED FILL — predniSONE 20 MG TABS: 20 | 7 days supply | Qty: 7 | Fill #0

## 2017-07-01 NOTE — Addendum Note (Signed)
Addended by: Raj Janus T on: 07/01/2017 07:50 AM   Modules accepted: Orders

## 2017-07-01 NOTE — Telephone Encounter (Signed)
Sent to the pharmacy by e-scribe. 

## 2017-07-05 ENCOUNTER — Ambulatory Visit
Admission: RE | Admit: 2017-07-05 | Discharge: 2017-07-05 | Disposition: A | Discharge status: Home / Self Care | Payer: 59 | Source: Ambulatory Visit | Attending: Adult Health | Admitting: Adult Health

## 2017-07-05 DIAGNOSIS — M5126 Other intervertebral disc displacement, lumbar region: Secondary | ICD-10-CM | POA: Diagnosis not present

## 2017-07-05 DIAGNOSIS — M5432 Sciatica, left side: Secondary | ICD-10-CM

## 2017-07-06 ENCOUNTER — Other Ambulatory Visit: Payer: Self-pay | Admitting: Family Medicine

## 2017-07-06 DIAGNOSIS — M5136 Other intervertebral disc degeneration, lumbar region: Secondary | ICD-10-CM

## 2017-07-06 DIAGNOSIS — M5126 Other intervertebral disc displacement, lumbar region: Secondary | ICD-10-CM

## 2017-07-06 DIAGNOSIS — M48061 Spinal stenosis, lumbar region without neurogenic claudication: Secondary | ICD-10-CM

## 2017-07-07 ENCOUNTER — Other Ambulatory Visit: Payer: Self-pay | Admitting: Family Medicine

## 2017-07-07 MED ORDER — NAPROXEN 500 MG PO TABS: 500.0000 mg | ORAL_TABLET | Freq: Two times a day (BID) | ORAL | 0 refills | Status: DC

## 2017-07-07 MED FILL — NAPROXEN 500 MG TABLET: 500 | 90 days supply | Qty: 180 | Fill #0

## 2017-07-15 ENCOUNTER — Telehealth: Payer: Self-pay | Admitting: *Deleted

## 2017-07-15 MED ORDER — PREDNISONE 20 MG PO TABS: 20.0000 mg | ORAL_TABLET | Freq: Every day | ORAL | 0 refills | Status: DC

## 2017-07-15 MED FILL — predniSONE 20 MG TABS: 20 | 5 days supply | Qty: 5 | Fill #0

## 2017-07-15 NOTE — Telephone Encounter (Signed)
Copied from CRM [id number]. Topic: General - Other >> Jul 15, 2017 10:29 AM Percival SpanishKennedy, Cheryl W wrote:  Pt call to say she is in pain and is asking if Kandee KeenCory will send her in predniSONE (DELTASONE) 20 MG tablet   she said NAPROXEN is not working     She said she just tired of being in pain and if he can call her  5 pills just enough to get her to her appt on Wednesday  Pt would also like to know if her FMLA paperwork was receive    can call her at [phone] till 1pm ..    after 1 pm can call her cell phone    [facility]

## 2017-07-15 NOTE — Telephone Encounter (Signed)
Tried to reach the pt.  Received a message that the voicemail box has not been set up.  Will try again at a later time.  CRM created.

## 2017-07-15 NOTE — Telephone Encounter (Signed)
Ok to send in 20 mg prednisone, 5 pills, 0 refills

## 2017-07-18 ENCOUNTER — Telehealth: Payer: Self-pay | Admitting: Adult Health

## 2017-07-18 NOTE — Telephone Encounter (Signed)
Patient is requesting a call about her FMLA paperwork.  She said that the paperwork should have been sent to you a week ago last Friday.  She has a question about it.

## 2017-07-18 NOTE — Telephone Encounter (Signed)
FMLA forms, placed in Dr's folder.  Patient stated FMLA forms were sent over by fax a week ago.  She brought this set of FMLA forms to be filled out if the first set wasn't filled out.  Forms need to be faxed to number on form for [employer].

## 2017-07-19 NOTE — Telephone Encounter (Signed)
Pt notified that FMLA form was received.  She has picked up her prednisone from the pharmacy.  Has appt with neuro on 07/20/17.  No further action required.

## 2017-07-19 NOTE — Telephone Encounter (Signed)
Form received today

## 2017-07-20 DIAGNOSIS — M5416 Radiculopathy, lumbar region: Secondary | ICD-10-CM | POA: Diagnosis not present

## 2017-07-20 DIAGNOSIS — Z6828 Body mass index (BMI) 28.0-28.9, adult: Secondary | ICD-10-CM | POA: Diagnosis not present

## 2017-07-20 NOTE — Telephone Encounter (Signed)
Form complete.  A copy was faxed to [employer].  Received confirmation that the fax was successful.  A copy was sent to scan.  A copy was also mailed to the pt.  No further action required.  Will close note.

## 2017-07-21 MED FILL — METHOCARBAMOL 750 MG TABS: 750 | 30 days supply | Qty: 90 | Fill #0

## 2017-07-21 MED FILL — DICLOFENAC SOD EC 75 MG TAB: 75 | 30 days supply | Qty: 60 | Fill #0

## 2017-07-21 MED FILL — OLMSRTN-AMLDPN-HCTZ 40-5-25: 40-5-25 | 30 days supply | Qty: 30 | Fill #5

## 2017-07-29 ENCOUNTER — Other Ambulatory Visit: Payer: Self-pay | Admitting: Adult Health

## 2017-07-29 MED FILL — TOPIRAMATE 25 MG TABLET: 25 | 90 days supply | Qty: 90 | Fill #0

## 2017-08-05 MED FILL — GABAPENTIN 300 MG CAPSULE: 300 | 30 days supply | Qty: 90 | Fill #0

## 2017-08-05 MED FILL — METHYLPREDNISOLONE 4 MG TAB: 4 | 6 days supply | Qty: 21 | Fill #0

## 2017-08-22 ENCOUNTER — Telehealth: Payer: Self-pay | Admitting: *Deleted

## 2017-08-22 NOTE — Telephone Encounter (Signed)
Copied from CRM [id number]. Topic: Quick Communication - See Telephone Encounter >> Aug 22, 2017  3:53 PM Herby AbrahamJohnson, Shiquita C wrote: CRM for notification. See Telephone encounter for: 08/22/17.  Pt says that she is still in pain, pt has been seen at Neuro and is waiting to get a shot on August 1. Meanwhile, pt would like to get a second opinion, She is not sure if she should go through Neuro provider or through PCP to request? Pt says that the medication (IB Pro fin) that PCP prescribed helps but she think that provider may have to increase dosage because it doesn't reduce the pain long enough, pt would like to be advised.    CB: [phone]

## 2017-08-22 NOTE — Telephone Encounter (Signed)
I do not see where we have prescribed Ibuprofen , Cory prescribed Naproxen 500mg  in May 2019?? Called pt to clarify medication being requested. Pt states that it is the Naproxen that she is wanting the dose increased for. Pt states that at current dose 500mg  bid the pain relief only last 3-4 hours.   Please advise if can be increased or there is something stronger that can be prescribed that wont have a heavy sedating effect to it.

## 2017-08-23 NOTE — Telephone Encounter (Signed)
Tried to reach the pt.  Received a message that the voicemail box has not been set up.  Will try again at a later time.  Need to find out if pt has contacted neurosurgery for pain management.

## 2017-08-23 NOTE — Telephone Encounter (Signed)
Pt called [facility] to reach Misty back. Misty was rooming a pt, but was able to message me to advise pt will need to call Neuro for any medication changes. [facility] agent advised pt of this.

## 2017-08-26 ENCOUNTER — Other Ambulatory Visit: Payer: Self-pay | Admitting: Adult Health

## 2017-08-26 ENCOUNTER — Encounter: Payer: Self-pay | Admitting: Family Medicine

## 2017-08-26 ENCOUNTER — Telehealth: Payer: Self-pay | Admitting: *Deleted

## 2017-08-26 MED FILL — OLMSRTN-AMLDPN-HCTZ 40-5-25: 40-5-25 | 30 days supply | Qty: 30 | Fill #0

## 2017-08-26 NOTE — Telephone Encounter (Signed)
She can have 90 days, needs physical before refills will be given

## 2017-08-26 NOTE — Telephone Encounter (Signed)
Called in earlier this morning.

## 2017-08-26 NOTE — Telephone Encounter (Signed)
Copied from CRM [id number]. Topic: Quick Communication - Rx Refill/Question >> Aug 26, 2017  8:32 AM Valerie Gilmore, Valerie Gilmore wrote: Medication: Olmesartan-Amlodipine-HCTZ 40-5-25 MG TABS Has the patient contacted their pharmacy? Yes  Preferred Pharmacy (with phone number or street name): [facility] [place] [address] [phone] (Phone) [phone] (Fax)      Agent: Please be advised that RX refills may take up to 3 business days. We ask that you follow-up with your pharmacy.

## 2017-08-26 NOTE — Telephone Encounter (Signed)
Pt needs cpx.  Please advise.

## 2017-08-26 NOTE — Telephone Encounter (Signed)
Sent to the pharmacy as instructed.  Letter mailed to the pt.

## 2017-08-26 NOTE — Telephone Encounter (Signed)
Copied from CRM [id number]. Topic: General - Other >> Aug 26, 2017  8:34 AM Gean BirchwoodWilliams-Neal, Valerie R wrote: Pt called in and stated that she ran out of Olmesartan-Amlodipine-HCTZ 40-5-25 MG TABS she forgot to call and refill and now she doesn't have anymore. She wants to know if it can be sent in to get refilled today if not are there any samples available.  CB# [phone] after 3p CB# [phone] before 3p

## 2017-08-29 MED FILL — IBUPROFEN 800 MG TAB: 800 | 30 days supply | Qty: 90 | Fill #0

## 2017-09-08 DIAGNOSIS — M5416 Radiculopathy, lumbar region: Secondary | ICD-10-CM | POA: Diagnosis not present

## 2017-09-16 ENCOUNTER — Ambulatory Visit: Payer: 59 | Admitting: Adult Health

## 2017-09-16 ENCOUNTER — Encounter: Payer: Self-pay | Admitting: Adult Health

## 2017-09-16 VITALS — BP 112/80 | Temp 98.4°F | Wt 180.0 lb

## 2017-09-16 DIAGNOSIS — R300 Dysuria: Secondary | ICD-10-CM

## 2017-09-16 DIAGNOSIS — R1084 Generalized abdominal pain: Secondary | ICD-10-CM | POA: Diagnosis not present

## 2017-09-16 LAB — POC URINALSYSI DIPSTICK (AUTOMATED)
BILIRUBIN UA: NEGATIVE
Glucose, UA: NEGATIVE
KETONES UA: NEGATIVE
Leukocytes, UA: NEGATIVE
NITRITE UA: NEGATIVE
PH UA: 6 (ref 5.0–8.0)
Protein, UA: NEGATIVE
RBC UA: NEGATIVE
Spec Grav, UA: 1.015 (ref 1.010–1.025)
Urobilinogen, UA: 0.2 E.U./dL

## 2017-09-16 NOTE — Progress Notes (Signed)
Subjective:    Patient ID: Valerie Gilmore, female    DOB: 12/06/1970, 47 y.o.   MRN: [id number]  HPI   47 year old female who  has a past medical history of Depression and Hypertension.  She presents to the office today with an acute complaint.  She reports that over the last week she has had intermittent episodes of pelvic "pressure", cramping "sharp" abdominal pain,  nausea, vomiting ( last episode was yesterday), low back pain.  She has also had some burning with urination but has not noticed any hematuria, urinary urgency, or frequency.  She denies any fevers, diarrhea, chills, or constipation.   She has chronic low back pain and is currently being managed by neurosurgery.  She reports that just prior to her symptoms presenting she received a steroid shot in her spine.  Review of Systems See HPI   Past Medical History:  Diagnosis Date  . Depression   . Hypertension    high blood pressure readings     Social History   Socioeconomic History  . Marital status: Single    Spouse name: Not on file  . Number of children: Not on file  . Years of education: Not on file  . Highest education level: Not on file  Occupational History  . Not on file  Social Needs  . Financial resource strain: Not on file  . Food insecurity:    Worry: Not on file    Inability: Not on file  . Transportation needs:    Medical: Not on file    Non-medical: Not on file  Tobacco Use  . Smoking status: Never Smoker  . Smokeless tobacco: Never Used  Substance and Sexual Activity  . Alcohol use: No    Alcohol/week: 0.0 standard drinks  . Drug use: No  . Sexual activity: Not on file  Lifestyle  . Physical activity:    Days per week: Not on file    Minutes per session: Not on file  . Stress: Not on file  Relationships  . Social connections:    Talks on phone: Not on file    Gets together: Not on file    Attends religious service: Not on file    Active member of club or organization: Not on file    Attends meetings of clubs or organizations: Not on file    Relationship status: Not on file  . Intimate partner violence:    Fear of current or ex partner: Not on file    Emotionally abused: Not on file    Physically abused: Not on file    Forced sexual activity: Not on file  Other Topics Concern  . Not on file  Social History Narrative  . Not on file    Past Surgical History:  Procedure Laterality Date  . ABDOMINAL HYSTERECTOMY  2011    Family History  Problem Relation Age of Onset  . Breast cancer Paternal Grandmother   . Prostate cancer Father   . Hypertension Unknown        both sides     Allergies  Allergen Reactions  . Other Rash    Powder gloves cause a rash    Current Outpatient Medications on File Prior to Visit  Medication Sig Dispense Refill  . cetirizine (ZYRTEC) 10 MG tablet Take 1 tablet (10 mg total) by mouth daily. 30 tablet 11  . cyclobenzaprine (FLEXERIL) 5 MG tablet Take 1 tablet (5 mg total) by mouth 3 (three) times daily as  needed. 90 tablet 1  . fluticasone (FLONASE) 50 MCG/ACT nasal spray Place 2 sprays into both nostrils daily. 16 g 6  . lansoprazole (PREVACID) 15 MG capsule Take 1 capsule (15 mg total) by mouth daily at 12 noon. 90 capsule 3  . loratadine (CLARITIN) 10 MG tablet Take 1 tablet (10 mg total) by mouth daily. 30 tablet 11  . naproxen (NAPROSYN) 500 MG tablet Take 1 tablet (500 mg total) by mouth 2 (two) times daily with a meal. 180 tablet 0  . Olmesartan-amLODIPine-HCTZ 40-5-25 MG TABS Take 1 tablet by mouth daily. **NEEDS ANNUAL VISIT WITH Rendon Howell FOR FURTHER REFILLS** 90 tablet 0  . predniSONE (DELTASONE) 20 MG tablet Take 1 tablet (20 mg total) by mouth daily. 5 tablet 0  . topiramate (TOPAMAX) 25 MG tablet TAKE 1 TABLET BY MOUTH ONCE DAILY 90 tablet 2   No current facility-administered medications on file prior to visit.     BP 112/80   Temp 98.4 F (36.9 C) (Oral)   Wt 180 lb (81.6 kg)   BMI 27.37 kg/m       Objective:     Physical Exam  Constitutional: She is oriented to person, place, and time. She appears well-developed and well-nourished.  Non-toxic appearance. She does not appear ill. She is not intubated.  Eyes: Pupils are equal, round, and reactive to light. Conjunctivae and EOM are normal.  Neck: Normal range of motion. Neck supple.  Cardiovascular: Normal rate, regular rhythm, normal heart sounds and intact distal pulses. Exam reveals no gallop and no friction rub.  No murmur heard. Pulmonary/Chest: Effort normal. No accessory muscle usage. No apnea, no tachypnea and no bradypnea. She is not intubated. No respiratory distress.  Abdominal: Soft. Bowel sounds are normal. She exhibits no distension and no mass. There is no hepatosplenomegaly, splenomegaly or hepatomegaly. There is tenderness in the right lower quadrant, epigastric area and periumbilical area. There is rebound and tenderness at McBurney's point. There is no rigidity, no guarding, no CVA tenderness and negative Murphy's sign. No hernia.  Musculoskeletal: Normal range of motion. She exhibits no edema, tenderness or deformity.  Neurological: She is alert and oriented to person, place, and time.  Skin: Skin is warm and dry.  Psychiatric: She has a normal mood and affect. Her behavior is normal. Judgment and thought content normal.  Nursing note and vitals reviewed.     Assessment & Plan:  1. Generalized abdominal pain -Nominal pain in epigastric, periumbilical and right lower quadrant.  Pain was worse in right lower quadrant, more specifically around McBurney's point.  Due to signs and symptoms and amount of pain that she was in with palpation to right lower quadrant I advised her that I could not rule out appendicitis.  I advised her to go to the emergency room for further evaluation.  She was hesitant to go, but being that it was 3:00 on a Friday afternoon was unable to get stat CT at this time.  She was ultimately agreeable to go  2.  Dysuria - POCT Urinalysis Dipstick (Automated)- negative   Shirline Frees, NP

## 2017-09-26 MED FILL — OLMSRTN-AMLDPN-HCTZ 40-5-25: 40-5-25 | 30 days supply | Qty: 30 | Fill #1

## 2017-10-28 MED FILL — OLMSRTN-AMLDPN-HCTZ 40-5-25: 40-5-25 | 30 days supply | Qty: 30 | Fill #2

## 2017-11-14 DIAGNOSIS — H43393 Other vitreous opacities, bilateral: Secondary | ICD-10-CM | POA: Diagnosis not present

## 2017-11-14 DIAGNOSIS — H04123 Dry eye syndrome of bilateral lacrimal glands: Secondary | ICD-10-CM | POA: Diagnosis not present

## 2017-11-16 ENCOUNTER — Other Ambulatory Visit: Payer: Self-pay | Admitting: Adult Health

## 2017-11-16 MED FILL — TOPIRAMATE 25 MG TABLET: 25 | 90 days supply | Qty: 90 | Fill #1

## 2017-11-17 ENCOUNTER — Encounter: Payer: Self-pay | Admitting: Adult Health

## 2017-11-17 ENCOUNTER — Ambulatory Visit: Payer: 59 | Admitting: Adult Health

## 2017-11-17 VITALS — BP 122/80 | HR 62 | Temp 98.4°F | Wt 181.7 lb

## 2017-11-17 DIAGNOSIS — I10 Essential (primary) hypertension: Secondary | ICD-10-CM | POA: Diagnosis not present

## 2017-11-17 DIAGNOSIS — M4807 Spinal stenosis, lumbosacral region: Secondary | ICD-10-CM | POA: Diagnosis not present

## 2017-11-17 DIAGNOSIS — H6983 Other specified disorders of Eustachian tube, bilateral: Secondary | ICD-10-CM

## 2017-11-17 DIAGNOSIS — R252 Cramp and spasm: Secondary | ICD-10-CM

## 2017-11-17 DIAGNOSIS — J029 Acute pharyngitis, unspecified: Secondary | ICD-10-CM

## 2017-11-17 LAB — BASIC METABOLIC PANEL
BUN: 14 mg/dL (ref 6–23)
CALCIUM: 9.4 mg/dL (ref 8.4–10.5)
CHLORIDE: 107 meq/L (ref 96–112)
CO2: 30 mEq/L (ref 19–32)
CREATININE: 0.87 mg/dL (ref 0.40–1.20)
GFR: 90.62 mL/min (ref >60.00)
Glucose, Bld: 109 mg/dL — ABNORMAL HIGH (ref 70–99)
Potassium: 3.6 mEq/L (ref 3.5–5.1)
Sodium: 140 mEq/L (ref 135–145)

## 2017-11-17 LAB — CBC WITH DIFFERENTIAL/PLATELET
BASOS PCT: 0.4 % (ref 0.0–3.0)
Basophils Absolute: 0 10*3/uL (ref 0.0–0.1)
Eosinophils Absolute: 0 10*3/uL (ref 0.0–0.7)
Eosinophils Relative: 0.6 % (ref 0.0–5.0)
HEMATOCRIT: 40.6 % (ref 36.0–46.0)
Hemoglobin: 13.4 g/dL (ref 12.0–15.0)
Lymphs Abs: 2.2 10*3/uL (ref 0.7–4.0)
MCHC: 33.1 g/dL (ref 30.0–36.0)
MCV: 84.9 fl (ref 78.0–100.0)
MONOS PCT: 10.6 % (ref 3.0–12.0)
Monocytes Absolute: 0.4 10*3/uL (ref 0.1–1.0)
Neutro Abs: 1.1 10*3/uL — ABNORMAL LOW (ref 1.4–7.7)
Neutrophils Relative %: 28.7 % — ABNORMAL LOW (ref 43.0–77.0)
Platelets: 334 10*3/uL (ref 150.0–400.0)
RBC: 4.78 Mil/uL (ref 3.87–5.11)
RDW: 13.6 % (ref 11.5–15.5)
WBC: 3.7 10*3/uL — ABNORMAL LOW (ref 4.0–10.5)

## 2017-11-17 LAB — POCT RAPID STREP A (OFFICE): Rapid Strep A Screen: NEGATIVE

## 2017-11-17 LAB — MAGNESIUM: MAGNESIUM: 2.3 mg/dL (ref 1.5–2.5)

## 2017-11-17 MED ORDER — OLMESARTAN-AMLODIPINE-HCTZ 40-5-25 MG PO TABS: 1.0000 | ORAL_TABLET | Freq: Every day | ORAL | 0 refills | Status: DC

## 2017-11-17 NOTE — Progress Notes (Addendum)
Subjective:    Patient ID: Valerie Gilmore, female    DOB: May 12, 1970, 47 y.o.   MRN: [id number]  HPI  47 year old female who  has a past medical history of Depression and Hypertension.  She presents to the office today for refill of her blood pressure medication.   She also reports that she received her flu shot last week and since has not felt well. She has had a low grade temp x 2 days, feeling of ear fullness and sore throat. She reports throat pain is worse when she swallows.   Denies any sinus pain or pressure   She also reports leg cramping over the last few weeks. Cramping is happening nearly every day but is intermittent throughout the day   Review of Systems See HPI   Past Medical History:  Diagnosis Date  . Depression   . Hypertension    high blood pressure readings     Social History   Socioeconomic History  . Marital status: Single    Spouse name: Not on file  . Number of children: Not on file  . Years of education: Not on file  . Highest education level: Not on file  Occupational History  . Not on file  Social Needs  . Financial resource strain: Not on file  . Food insecurity:    Worry: Not on file    Inability: Not on file  . Transportation needs:    Medical: Not on file    Non-medical: Not on file  Tobacco Use  . Smoking status: Never Smoker  . Smokeless tobacco: Never Used  Substance and Sexual Activity  . Alcohol use: No    Alcohol/week: 0.0 standard drinks  . Drug use: No  . Sexual activity: Not on file  Lifestyle  . Physical activity:    Days per week: Not on file    Minutes per session: Not on file  . Stress: Not on file  Relationships  . Social connections:    Talks on phone: Not on file    Gets together: Not on file    Attends religious service: Not on file    Active member of club or organization: Not on file    Attends meetings of clubs or organizations: Not on file    Relationship status: Not on file  . Intimate partner  violence:    Fear of current or ex partner: Not on file    Emotionally abused: Not on file    Physically abused: Not on file    Forced sexual activity: Not on file  Other Topics Concern  . Not on file  Social History Narrative  . Not on file    Past Surgical History:  Procedure Laterality Date  . ABDOMINAL HYSTERECTOMY  2011    Family History  Problem Relation Age of Onset  . Breast cancer Paternal Grandmother   . Prostate cancer Father   . Hypertension Unknown        both sides     Allergies  Allergen Reactions  . Other Rash    Powder gloves cause a rash    Current Outpatient Medications on File Prior to Visit  Medication Sig Dispense Refill  . cetirizine (ZYRTEC) 10 MG tablet Take 1 tablet (10 mg total) by mouth daily. 30 tablet 11  . cyclobenzaprine (FLEXERIL) 5 MG tablet Take 1 tablet (5 mg total) by mouth 3 (three) times daily as needed. 90 tablet 1  . fluticasone (FLONASE) 50 MCG/ACT nasal  spray Place 2 sprays into both nostrils daily. 16 g 6  . lansoprazole (PREVACID) 15 MG capsule Take 1 capsule (15 mg total) by mouth daily at 12 noon. 90 capsule 3  . loratadine (CLARITIN) 10 MG tablet Take 1 tablet (10 mg total) by mouth daily. 30 tablet 11  . naproxen (NAPROSYN) 500 MG tablet Take 1 tablet (500 mg total) by mouth 2 (two) times daily with a meal. 180 tablet 0  . predniSONE (DELTASONE) 20 MG tablet Take 1 tablet (20 mg total) by mouth daily. 5 tablet 0  . topiramate (TOPAMAX) 25 MG tablet TAKE 1 TABLET BY MOUTH ONCE DAILY 90 tablet 2   No current facility-administered medications on file prior to visit.     BP 122/80 (BP Location: Left Arm, Patient Position: Sitting, Cuff Size: Normal)   Pulse 62   Temp 98.4 F (36.9 C) (Oral)   Wt 181 lb 11.2 oz (82.4 kg)   SpO2 98%   BMI 27.63 kg/m       Objective:   Physical Exam  Constitutional: She is oriented to person, place, and time. She appears well-developed and well-nourished. No distress.  HENT:  Head:  Normocephalic and atraumatic.  Right Ear: External ear normal. Tympanic membrane is not erythematous. A middle ear effusion is present.  Left Ear: External ear normal. Tympanic membrane is not erythematous. A middle ear effusion is present.  Nose: Nose normal. Right sinus exhibits no frontal sinus tenderness. Left sinus exhibits no frontal sinus tenderness.  Mouth/Throat: Uvula is midline. Posterior oropharyngeal erythema present. No oropharyngeal exudate, posterior oropharyngeal edema or tonsillar abscesses. Tonsils are 2+ on the right. Tonsils are 2+ on the left. No tonsillar exudate.  Eyes: Pupils are equal, round, and reactive to light. Conjunctivae and EOM are normal. Right eye exhibits no discharge. Left eye exhibits no discharge. No scleral icterus.  Neck: Normal range of motion. Neck supple.  Cardiovascular: Normal rate, regular rhythm, normal heart sounds and intact distal pulses.  Pulmonary/Chest: Effort normal and breath sounds normal.  Lymphadenopathy:    She has no cervical adenopathy.  Neurological: She is alert and oriented to person, place, and time.  Skin: Skin is warm and dry. She is not diaphoretic.  Psychiatric: She has a normal mood and affect. Her behavior is normal. Thought content normal.  Nursing note and vitals reviewed.     Assessment & Plan:  1. Muscle cramps - Will check basis labs  - CBC with Differential/Platelet - Basic Metabolic Panel - Magnesium  2. Sore throat  - POC Rapid Strep A- negative   3. Essential hypertension - Well controlled. Will send in medication for 90 days - she needs to have CPE done in that time   4. Eustachian tube dysfunction, bilateral - Use flonase  - Follow up if no improvement   Shirline Frees, NP

## 2017-11-17 NOTE — Patient Instructions (Addendum)
It was great seeing you today   Your strep was negative   Your blood pressure medication was sent in for 90 days - please have your physical done within those 90 days   I will follow up with you regarding your blood work   Please start using Flonase for your symptoms

## 2017-11-18 NOTE — Telephone Encounter (Signed)
This was filled on 11/17/17 for 90 days.

## 2017-11-23 MED FILL — FLUTICASONE PROP 50 MCG SPR: 50 | 30 days supply | Qty: 16 | Fill #1

## 2017-11-23 MED FILL — OLMSRTN-AMLDPN-HCTZ 40-5-25: 40-5-25 | 30 days supply | Qty: 30 | Fill #0

## 2017-12-27 MED FILL — OLMSRTN-AMLDPN-HCTZ 40-5-25: 40-5-25 | 30 days supply | Qty: 30 | Fill #1

## 2018-01-10 ENCOUNTER — Ambulatory Visit: Payer: 59 | Admitting: Adult Health

## 2018-01-10 ENCOUNTER — Encounter: Payer: Self-pay | Admitting: Adult Health

## 2018-01-10 VITALS — BP 130/88 | Temp 98.1°F | Wt 180.0 lb

## 2018-01-10 DIAGNOSIS — R51 Headache: Secondary | ICD-10-CM

## 2018-01-10 DIAGNOSIS — R0789 Other chest pain: Secondary | ICD-10-CM | POA: Diagnosis not present

## 2018-01-10 DIAGNOSIS — F419 Anxiety disorder, unspecified: Secondary | ICD-10-CM

## 2018-01-10 DIAGNOSIS — R519 Headache, unspecified: Secondary | ICD-10-CM

## 2018-01-10 LAB — BASIC METABOLIC PANEL
BUN: 16 mg/dL (ref 6–23)
CHLORIDE: 102 meq/L (ref 96–112)
CO2: 31 mEq/L (ref 19–32)
CREATININE: 0.82 mg/dL (ref 0.40–1.20)
Calcium: 9.7 mg/dL (ref 8.4–10.5)
GFR: 96.96 mL/min (ref >60.00)
Glucose, Bld: 120 mg/dL — ABNORMAL HIGH (ref 70–99)
Potassium: 3.7 mEq/L (ref 3.5–5.1)
Sodium: 140 mEq/L (ref 135–145)

## 2018-01-10 LAB — CBC WITH DIFFERENTIAL/PLATELET
BASOS ABS: 0 10*3/uL (ref 0.0–0.1)
Basophils Relative: 0.6 % (ref 0.0–3.0)
EOS ABS: 0 10*3/uL (ref 0.0–0.7)
Eosinophils Relative: 0.7 % (ref 0.0–5.0)
HCT: 39.3 % (ref 36.0–46.0)
Hemoglobin: 13.2 g/dL (ref 12.0–15.0)
LYMPHS ABS: 1.6 10*3/uL (ref 0.7–4.0)
Lymphocytes Relative: 46.7 % — ABNORMAL HIGH (ref 12.0–46.0)
MCHC: 33.7 g/dL (ref 30.0–36.0)
MCV: 84 fl (ref 78.0–100.0)
MONOS PCT: 9.6 % (ref 3.0–12.0)
Monocytes Absolute: 0.3 10*3/uL (ref 0.1–1.0)
NEUTROS ABS: 1.5 10*3/uL (ref 1.4–7.7)
NEUTROS PCT: 42.4 % — AB (ref 43.0–77.0)
PLATELETS: 326 10*3/uL (ref 150.0–400.0)
RBC: 4.68 Mil/uL (ref 3.87–5.11)
RDW: 14.1 % (ref 11.5–15.5)
WBC: 3.5 10*3/uL — ABNORMAL LOW (ref 4.0–10.5)

## 2018-01-10 LAB — C-REACTIVE PROTEIN: CRP: 0.1 mg/dL — ABNORMAL LOW (ref 0.5–20.0)

## 2018-01-10 MED ORDER — CITALOPRAM HYDROBROMIDE 10 MG PO TABS: 10.0000 mg | ORAL_TABLET | Freq: Every day | ORAL | 2 refills | Status: DC

## 2018-01-10 MED FILL — CITALOPRAM HBR 10 MG TABLET: 10 | 30 days supply | Qty: 30 | Fill #0

## 2018-01-10 NOTE — Progress Notes (Signed)
Subjective:    Patient ID: Valerie Gilmore, female    DOB: May 30, 1970, 47 y.o.   MRN: [id number]  HPI 47 year old female who  has a past medical history of Depression and Hypertension.  She presents to the office today for an acute issue of   1. Headaches - Reports headaches that has been happening on a daily occurrence since around October. She will "sometimes" see floaters and have blurred vision. She has been seen by her eye doctor and that exam was normal. She is taking her Topamax daily. When she takes Motrin her symptoms improve but never fully go away. Pain is described as " an ache or heaviness". Pain is located on the right side, behind the eye and across the right side of her head. Denies photophobia or phonophobia   2. Left sided chest pressure. Has been present over the last 2 weeks intermittently but over the last 7-10 days the pressure has been more constant. Has radiating pressure in left shoulder and under left breast. Feels as though her symptoms are more prevalent during times of stress. She does report episodes of shortness of breath but these are infrequent and also correspond with stressful events.    Review of Systems  See HPI    Past Medical History:  Diagnosis Date  . Depression   . Hypertension    high blood pressure readings     Social History   Socioeconomic History  . Marital status: Single    Spouse name: Not on file  . Number of children: Not on file  . Years of education: Not on file  . Highest education level: Not on file  Occupational History  . Not on file  Social Needs  . Financial resource strain: Not on file  . Food insecurity:    Worry: Not on file    Inability: Not on file  . Transportation needs:    Medical: Not on file    Non-medical: Not on file  Tobacco Use  . Smoking status: Never Smoker  . Smokeless tobacco: Never Used  Substance and Sexual Activity  . Alcohol use: No    Alcohol/week: 0.0 standard drinks  . Drug use: No    . Sexual activity: Not on file  Lifestyle  . Physical activity:    Days per week: Not on file    Minutes per session: Not on file  . Stress: Not on file  Relationships  . Social connections:    Talks on phone: Not on file    Gets together: Not on file    Attends religious service: Not on file    Active member of club or organization: Not on file    Attends meetings of clubs or organizations: Not on file    Relationship status: Not on file  . Intimate partner violence:    Fear of current or ex partner: Not on file    Emotionally abused: Not on file    Physically abused: Not on file    Forced sexual activity: Not on file  Other Topics Concern  . Not on file  Social History Narrative  . Not on file    Past Surgical History:  Procedure Laterality Date  . ABDOMINAL HYSTERECTOMY  2011    Family History  Problem Relation Age of Onset  . Breast cancer Paternal Grandmother   . Prostate cancer Father   . Hypertension Unknown        both sides     Allergies  Allergen Reactions  . Other Rash    Powder gloves cause a rash    Current Outpatient Medications on File Prior to Visit  Medication Sig Dispense Refill  . cetirizine (ZYRTEC) 10 MG tablet Take 1 tablet (10 mg total) by mouth daily. 30 tablet 11  . cyclobenzaprine (FLEXERIL) 5 MG tablet Take 1 tablet (5 mg total) by mouth 3 (three) times daily as needed. 90 tablet 1  . fluticasone (FLONASE) 50 MCG/ACT nasal spray Place 2 sprays into both nostrils daily. 16 g 6  . lansoprazole (PREVACID) 15 MG capsule Take 1 capsule (15 mg total) by mouth daily at 12 noon. 90 capsule 3  . loratadine (CLARITIN) 10 MG tablet Take 1 tablet (10 mg total) by mouth daily. 30 tablet 11  . naproxen (NAPROSYN) 500 MG tablet Take 1 tablet (500 mg total) by mouth 2 (two) times daily with a meal. 180 tablet 0  . Olmesartan-amLODIPine-HCTZ 40-5-25 MG TABS Take 1 tablet by mouth daily. **NEEDS ANNUAL VISIT WITH Sayra Frisby FOR FURTHER REFILLS** 90 tablet 0   . predniSONE (DELTASONE) 20 MG tablet Take 1 tablet (20 mg total) by mouth daily. 5 tablet 0  . topiramate (TOPAMAX) 25 MG tablet TAKE 1 TABLET BY MOUTH ONCE DAILY 90 tablet 2   No current facility-administered medications on file prior to visit.     BP 130/88   Temp 98.1 F (36.7 C)   Wt 180 lb (81.6 kg)   BMI 27.37 kg/m       Objective:   Physical Exam  Constitutional: She is oriented to person, place, and time. She appears well-developed and well-nourished. No distress.  HENT:  Head: Normocephalic and atraumatic.  Right Ear: Hearing, tympanic membrane, external ear and ear canal normal.  Left Ear: Hearing, tympanic membrane, external ear and ear canal normal.  Nose: Nose normal. No mucosal edema or rhinorrhea.  Mouth/Throat: Uvula is midline and oropharynx is clear and moist. No oropharyngeal exudate.  Eyes: Pupils are equal, round, and reactive to light. Conjunctivae and EOM are normal. Right eye exhibits no discharge. Left eye exhibits no discharge. No scleral icterus.  Neck: Normal range of motion. Neck supple. No JVD present. No tracheal deviation present. No thyromegaly present.  Cardiovascular: Normal rate, regular rhythm, normal heart sounds and intact distal pulses. Exam reveals no gallop and no friction rub.  No murmur heard. Mild discomfort to left chest wall with palpation    Pulmonary/Chest: Effort normal and breath sounds normal. No stridor. No respiratory distress. She has no wheezes. She has no rales. She exhibits no tenderness.  Abdominal: Soft. Bowel sounds are normal. She exhibits no distension and no mass. There is no tenderness. There is no rebound and no guarding. No hernia.  Lymphadenopathy:    She has no cervical adenopathy.  Neurological: She is alert and oriented to person, place, and time.  Skin: Skin is warm and dry. She is not diaphoretic.  Psychiatric: She has a normal mood and affect. Her behavior is normal. Judgment and thought content normal.   Nursing note and vitals reviewed.     Assessment & Plan:  1. Intractable episodic headache, unspecified headache type - Will get MRI due to constant nature of headache and visual changes. Consider increasing Topamax.  - CBC with Differential/Platelet - Basic Metabolic Panel - C-reactive Protein - MR Brain Wo Contrast; Future  2. Left chest pressure - CBC with Differential/Platelet - Basic Metabolic Panel - EKG 12-Lead- NSR, Rate 61  3. Anxiety - I think  her symptoms are stress induced due to anxiety from home life and work. Will trial her on Celexa 10 mg. Follow up in one month  - citalopram (CELEXA) 10 MG tablet; Take 1 tablet (10 mg total) by mouth daily.  Dispense: 30 tablet; Refill: 2  Shirline Frees, NP

## 2018-01-17 ENCOUNTER — Encounter: Payer: Self-pay | Admitting: Adult Health

## 2018-01-17 ENCOUNTER — Ambulatory Visit (INDEPENDENT_AMBULATORY_CARE_PROVIDER_SITE_OTHER): Payer: 59 | Admitting: Adult Health

## 2018-01-17 VITALS — BP 146/80 | Temp 98.2°F | Wt 178.0 lb

## 2018-01-17 DIAGNOSIS — F411 Generalized anxiety disorder: Secondary | ICD-10-CM

## 2018-01-17 DIAGNOSIS — Z23 Encounter for immunization: Secondary | ICD-10-CM | POA: Diagnosis not present

## 2018-01-17 DIAGNOSIS — I10 Essential (primary) hypertension: Secondary | ICD-10-CM | POA: Diagnosis not present

## 2018-01-17 DIAGNOSIS — Z0000 Encounter for general adult medical examination without abnormal findings: Secondary | ICD-10-CM | POA: Diagnosis not present

## 2018-01-17 LAB — TSH: TSH: 0.89 u[IU]/mL (ref 0.35–4.50)

## 2018-01-17 LAB — HEPATIC FUNCTION PANEL
ALBUMIN: 4.5 g/dL (ref 3.5–5.2)
ALT: 17 U/L (ref 0–35)
AST: 18 U/L (ref 0–37)
Alkaline Phosphatase: 31 U/L — ABNORMAL LOW (ref 39–117)
Bilirubin, Direct: 0.1 mg/dL (ref 0.0–0.3)
Total Bilirubin: 0.4 mg/dL (ref 0.2–1.2)
Total Protein: 7.4 g/dL (ref 6.0–8.3)

## 2018-01-17 LAB — LDL CHOLESTEROL, DIRECT: Direct LDL: 119 mg/dL

## 2018-01-17 LAB — LIPID PANEL
Cholesterol: 185 mg/dL (ref 0–200)
HDL: 47.5 mg/dL (ref >39.00)
NonHDL: 137.43
Total CHOL/HDL Ratio: 4
Triglycerides: 209 mg/dL — ABNORMAL HIGH (ref 0.0–149.0)
VLDL: 41.8 mg/dL — ABNORMAL HIGH (ref 0.0–40.0)

## 2018-01-17 LAB — HEMOGLOBIN A1C: HEMOGLOBIN A1C: 6 % (ref 4.6–6.5)

## 2018-01-17 MED ORDER — OLMESARTAN-AMLODIPINE-HCTZ 40-5-25 MG PO TABS: 1.0000 | ORAL_TABLET | Freq: Every day | ORAL | 3 refills | Status: DC

## 2018-01-17 NOTE — Patient Instructions (Signed)
It was great seeing you today   Please follow up in one month after starting celexa   We will follow up with you regarding your blood work   Continue to work on diet and exercise

## 2018-01-17 NOTE — Progress Notes (Signed)
Subjective:    Patient ID: Valerie Gilmore, female    DOB: 09/30/1970, 47 y.o.   MRN: [id number]  HPI  Patient presents for yearly preventative medicine examination. She is a pleasant 47 year old female who  has a past medical history of Depression and Hypertension.  Essential Hypertension - Controlled with Tribenzor  BP Readings from Last 3 Encounters:  01/17/18 (!) 146/80  01/10/18 130/88  11/17/17 122/80   Migraine Headaches - Currently prescribed Topamax 25 mg - as of late her migraine headaches have not been well controlled. She is currently experiencing migraines multiple times per week. She has an MRI scheduled for later on this week. During her last visit, a week ago, she was started on Celexa 10 mg as it was thought at this time as part of her issue was stemming from anxiety. 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 Unfortunately, she is working two jobs and goes to school full time so her life is stressful right now.   All immunizations and health maintenance protocols were reviewed with the patient and needed orders were placed. She is due for tetanus booster  Appropriate screening laboratory values were ordered for the patient including screening of hyperlipidemia, renal function and hepatic function.  Medication reconciliation,  past medical history, social history, problem list and allergies were reviewed in detail with the patient  Goals were established with regard to weight loss, exercise, and  diet in compliance with medications  She does not need to have a pap at this time ( history of abdominal hysterectomy). She is up to date on routine dental and vision screens. She is not hd a mammogram done in 5 years. She will call the breast center.   Review of Systems  Constitutional: Negative.   HENT: Negative.   Eyes: Negative.   Respiratory: Negative.   Cardiovascular: Negative.   Gastrointestinal: Negative.     Endocrine: Negative.   Genitourinary: Negative.   Musculoskeletal: Negative.   Skin: Negative.   Allergic/Immunologic: Negative.   Neurological: Positive for headaches.  Hematological: Negative.   Psychiatric/Behavioral: The patient is nervous/anxious.    Past Medical History:  Diagnosis Date  . Depression   . Hypertension    high blood pressure readings     Social History   Socioeconomic History  . Marital status: Single    Spouse name: Not on file  . Number of children: Not on file  . Years of education: Not on file  . Highest education level: Not on file  Occupational History  . Not on file  Social Needs  . Financial resource strain: Not on file  . Food insecurity:    Worry: Not on file    Inability: Not on file  . Transportation needs:    Medical: Not on file    Non-medical: Not on file  Tobacco Use  . Smoking status: Never Smoker  . Smokeless tobacco: Never Used  Substance and Sexual Activity  . Alcohol use: No    Alcohol/week: 0.0 standard drinks  . Drug use: No  . Sexual activity: Not on file  Lifestyle  . Physical activity:    Days per week: Not on file    Minutes per session: Not on file  . Stress: Not on file  Relationships  . Social connections:    Talks on phone: Not on file    Gets together: Not on file  Attends religious service: Not on file    Active member of club or organization: Not on file    Attends meetings of clubs or organizations: Not on file    Relationship status: Not on file  . Intimate partner violence:    Fear of current or ex partner: Not on file    Emotionally abused: Not on file    Physically abused: Not on file    Forced sexual activity: Not on file  Other Topics Concern  . Not on file  Social History Narrative  . Not on file    Past Surgical History:  Procedure Laterality Date  . ABDOMINAL HYSTERECTOMY  2011    Family History  Problem Relation Age of Onset  . Breast cancer Paternal Grandmother   . Prostate  cancer Father   . Hypertension Unknown        both sides     Allergies  Allergen Reactions  . Other Rash    Powder gloves cause a rash    Current Outpatient Medications on File Prior to Visit  Medication Sig Dispense Refill  . cetirizine (ZYRTEC) 10 MG tablet Take 1 tablet (10 mg total) by mouth daily. 30 tablet 11  . citalopram (CELEXA) 10 MG tablet Take 1 tablet (10 mg total) by mouth daily. 30 tablet 2  . cyclobenzaprine (FLEXERIL) 5 MG tablet Take 1 tablet (5 mg total) by mouth 3 (three) times daily as needed. 90 tablet 1  . fluticasone (FLONASE) 50 MCG/ACT nasal spray Place 2 sprays into both nostrils daily. 16 g 6  . lansoprazole (PREVACID) 15 MG capsule Take 1 capsule (15 mg total) by mouth daily at 12 noon. 90 capsule 3  . loratadine (CLARITIN) 10 MG tablet Take 1 tablet (10 mg total) by mouth daily. 30 tablet 11  . naproxen (NAPROSYN) 500 MG tablet Take 1 tablet (500 mg total) by mouth 2 (two) times daily with a meal. 180 tablet 0  . Olmesartan-amLODIPine-HCTZ 40-5-25 MG TABS Take 1 tablet by mouth daily. **NEEDS ANNUAL VISIT WITH Koby Pickup FOR FURTHER REFILLS** 90 tablet 0  . topiramate (TOPAMAX) 25 MG tablet TAKE 1 TABLET BY MOUTH ONCE DAILY 90 tablet 2   No current facility-administered medications on file prior to visit.     BP (!) 146/80   Temp 98.2 F (36.8 C)   Wt 178 lb (80.7 kg)   BMI 27.06 kg/m       Objective:   Physical Exam  Constitutional: She is oriented to person, place, and time. She appears well-developed. No distress.  HENT:  Head: Normocephalic and atraumatic.  Right Ear: External ear normal.  Left Ear: External ear normal.  Nose: Nose normal.  Mouth/Throat: Oropharynx is clear and moist. No oropharyngeal exudate.  Eyes: Pupils are equal, round, and reactive to light. Conjunctivae and EOM are normal. Right eye exhibits no discharge. Left eye exhibits no discharge. No scleral icterus.  Neck: Normal range of motion. Neck supple. No JVD present. No  tracheal deviation present. No thyromegaly present.  Cardiovascular: Normal rate, regular rhythm, normal heart sounds and intact distal pulses. Exam reveals no gallop and no friction rub.  No murmur heard. Pulmonary/Chest: Effort normal and breath sounds normal. No stridor. No respiratory distress. She has no wheezes. She has no rales. She exhibits no tenderness. Right breast exhibits no inverted nipple, no mass, no nipple discharge, no skin change and no tenderness. Left breast exhibits no inverted nipple, no mass, no nipple discharge and no skin change.  Abdominal:  Soft. Bowel sounds are normal. She exhibits no distension and no mass. There is no tenderness. There is no rebound and no guarding. No hernia.  Musculoskeletal: Normal range of motion. She exhibits no edema, tenderness or deformity.  Lymphadenopathy:    She has no cervical adenopathy.  Neurological: She is alert and oriented to person, place, and time. She displays normal reflexes. No cranial nerve deficit. She exhibits normal muscle tone. Coordination normal.  Skin: Skin is warm and dry. Capillary refill takes less than 2 seconds. No rash noted. She is not diaphoretic. No erythema. No pallor.  Psychiatric: She has a normal mood and affect. Her behavior is normal. Judgment and thought content normal.  Nursing note and vitals reviewed.     Assessment & Plan:  1. Routine general medical examination at a health care facility - Encouraged heart healthy diet and exercise  - Call and schedule mammogram  - Follow up in one year or sooner if needed - Hemoglobin A1c - Hepatic function panel - Lipid panel - TSH  2. Essential hypertension - Not at goal today but has in the past. Will continue to monitor  - Hemoglobin A1c - Hepatic function panel - Lipid panel - TSH  3. Generalized anxiety disorder - Continue with Celexa  - Follow up in one month or sooner if needed  Shirline Frees, NP

## 2018-01-19 ENCOUNTER — Telehealth: Payer: Self-pay | Admitting: Adult Health

## 2018-01-19 ENCOUNTER — Ambulatory Visit
Admission: RE | Admit: 2018-01-19 | Discharge: 2018-01-19 | Disposition: A | Discharge status: Home / Self Care | Payer: 59 | Source: Ambulatory Visit | Attending: Adult Health | Admitting: Adult Health

## 2018-01-19 DIAGNOSIS — R51 Headache: Principal | ICD-10-CM

## 2018-01-19 DIAGNOSIS — R519 Headache, unspecified: Secondary | ICD-10-CM

## 2018-01-19 NOTE — Telephone Encounter (Signed)
Updated patient on her MR Brain which showed no specific cause for headaches.   Will have her increase Topamax from 25 mg to 50 mg daily. She will send me a message via mychart to see how she responds to this

## 2018-01-24 ENCOUNTER — Telehealth: Payer: Self-pay

## 2018-01-24 DIAGNOSIS — M5137 Other intervertebral disc degeneration, lumbosacral region: Secondary | ICD-10-CM

## 2018-01-24 DIAGNOSIS — M5136 Other intervertebral disc degeneration, lumbar region: Secondary | ICD-10-CM

## 2018-01-24 DIAGNOSIS — M51379 Other intervertebral disc degeneration, lumbosacral region without mention of lumbar back pain or lower extremity pain: Secondary | ICD-10-CM | POA: Insufficient documentation

## 2018-01-24 MED FILL — METHYLPREDNISOLONE 4 MG TAB: 4 | 6 days supply | Qty: 21 | Fill #0

## 2018-01-24 NOTE — Telephone Encounter (Signed)
Form filled out and placed in Cory's in box for completion.

## 2018-01-24 NOTE — Telephone Encounter (Signed)
Copied from CRM [phone]. Topic: General - Inquiry >> Jan 24, 2018  8:57 AM Maia Pettiesrtiz, Kristie S wrote: Reason for CRM: pt called stating she faxed FMLA paperwork on 12/13 to Attn: Misty. Please call pt to advise if it was received.

## 2018-01-24 NOTE — Telephone Encounter (Signed)
Paperwork received.

## 2018-01-26 NOTE — Telephone Encounter (Signed)
Paper work complete and at the front desk.  Tried to reach the pt.  Received a message that the voicemail box is not set up.  Will try again at a later time.

## 2018-01-27 MED FILL — OLMSRTN-AMLDPN-HCTZ 40-5-25: 40-5-25 | 30 days supply | Qty: 30 | Fill #2

## 2018-01-27 NOTE — Telephone Encounter (Signed)
Pt called back and notified. She will pick up next week.

## 2018-02-20 MED FILL — OLMSRTN-AMLDPN-HCTZ 40-5-25: 40-5-25 | 30 days supply | Qty: 30 | Fill #0

## 2018-02-26 DIAGNOSIS — F411 Generalized anxiety disorder: Secondary | ICD-10-CM | POA: Diagnosis not present

## 2018-02-26 DIAGNOSIS — M940 Chondrocostal junction syndrome [Tietze]: Secondary | ICD-10-CM | POA: Diagnosis not present

## 2018-03-14 DIAGNOSIS — M4807 Spinal stenosis, lumbosacral region: Secondary | ICD-10-CM | POA: Diagnosis not present

## 2018-03-16 ENCOUNTER — Telehealth: Payer: Self-pay | Admitting: Family Medicine

## 2018-03-16 NOTE — Telephone Encounter (Signed)
Patient returning call to office. Please advise.

## 2018-03-16 NOTE — Telephone Encounter (Signed)
Received FMLA forms for pt.  Paper work is so Valerie KettleDeanna can be out of work to assist her mother. Her mother's PCP will need to fill out paper work as Kandee KeenCory does not know how often her mother will need assistance.  Tried to reach the pt.  Received a message that the voicemail box has not been set up.  Will try again at a later time.

## 2018-03-17 ENCOUNTER — Other Ambulatory Visit: Payer: Self-pay | Admitting: Adult Health

## 2018-03-17 DIAGNOSIS — Z1231 Encounter for screening mammogram for malignant neoplasm of breast: Secondary | ICD-10-CM

## 2018-03-17 NOTE — Telephone Encounter (Signed)
Spoke with patient, FMLA for her person condition has already been completed which is the only one that [place] needed to fill out. The FMLA forms that were received pertaining to her Mother can be shredded. She does not need those filled out; they were sent in error.   Will send to [facility] as FYI

## 2018-03-29 MED FILL — IBUPROFEN 800 MG TAB: 800 | 30 days supply | Qty: 90 | Fill #1

## 2018-03-29 MED FILL — OLMSRTN-AMLDPN-HCTZ 40-5-25: 40-5-25 | 30 days supply | Qty: 30 | Fill #1

## 2018-04-13 ENCOUNTER — Encounter: Payer: Self-pay | Admitting: Adult Health

## 2018-04-13 ENCOUNTER — Ambulatory Visit: Payer: 59 | Admitting: Adult Health

## 2018-04-13 ENCOUNTER — Ambulatory Visit: Payer: 59

## 2018-04-13 VITALS — BP 138/92 | Temp 98.7°F | Wt 188.0 lb

## 2018-04-13 DIAGNOSIS — G5600 Carpal tunnel syndrome, unspecified upper limb: Secondary | ICD-10-CM | POA: Insufficient documentation

## 2018-04-13 DIAGNOSIS — G5603 Carpal tunnel syndrome, bilateral upper limbs: Secondary | ICD-10-CM | POA: Diagnosis not present

## 2018-04-13 MED ORDER — PREDNISONE 10 MG PO TABS: ORAL_TABLET | ORAL | 0 refills | Status: DC

## 2018-04-13 MED FILL — predniSONE 10 MG TABS: 10 | 14 days supply | Qty: 21 | Fill #0

## 2018-04-13 NOTE — Progress Notes (Signed)
Subjective:    Patient ID: Valerie Gilmore, female    DOB: 06-Jun-1970, 48 y.o.   MRN: [id number]  HPI   48 year old female who  has a past medical history of Depression and Hypertension.   She presents to the office today with a complaint of "carpal tunnel pain".  She has been seen for this in the past the last time being in June 2018.  She continues to wear her wrist splints at night but reports that the pain has been getting worse over the last few weeks.  She feels as though discomfort is worse at night and is localized to the first 3 digits of each hand.  She denies any radiating pain.  In the day she will feel as though her hands will cramp up after typing for extended periods of time.  In the past she has responded well to prednisone therapy   Review of Systems See HPI   Past Medical History:  Diagnosis Date  . Depression   . Hypertension    high blood pressure readings     Social History   Socioeconomic History  . Marital status: Single    Spouse name: Not on file  . Number of children: Not on file  . Years of education: Not on file  . Highest education level: Not on file  Occupational History  . Not on file  Social Needs  . Financial resource strain: Not on file  . Food insecurity:    Worry: Not on file    Inability: Not on file  . Transportation needs:    Medical: Not on file    Non-medical: Not on file  Tobacco Use  . Smoking status: Never Smoker  . Smokeless tobacco: Never Used  Substance and Sexual Activity  . Alcohol use: No    Alcohol/week: 0.0 standard drinks  . Drug use: No  . Sexual activity: Not on file  Lifestyle  . Physical activity:    Days per week: Not on file    Minutes per session: Not on file  . Stress: Not on file  Relationships  . Social connections:    Talks on phone: Not on file    Gets together: Not on file    Attends religious service: Not on file    Active member of club or organization: Not on file    Attends meetings of  clubs or organizations: Not on file    Relationship status: Not on file  . Intimate partner violence:    Fear of current or ex partner: Not on file    Emotionally abused: Not on file    Physically abused: Not on file    Forced sexual activity: Not on file  Other Topics Concern  . Not on file  Social History Narrative  . Not on file    Past Surgical History:  Procedure Laterality Date  . ABDOMINAL HYSTERECTOMY  2011    Family History  Problem Relation Age of Onset  . Breast cancer Paternal Grandmother   . Prostate cancer Father   . Hypertension Unknown        both sides     Allergies  Allergen Reactions  . Other Rash    Powder gloves cause a rash    Current Outpatient Medications on File Prior to Visit  Medication Sig Dispense Refill  . cetirizine (ZYRTEC) 10 MG tablet Take 1 tablet (10 mg total) by mouth daily. 30 tablet 11  . citalopram (CELEXA) 10 MG  tablet Take 1 tablet (10 mg total) by mouth daily. 30 tablet 2  . cyclobenzaprine (FLEXERIL) 5 MG tablet Take 1 tablet (5 mg total) by mouth 3 (three) times daily as needed. 90 tablet 1  . fluticasone (FLONASE) 50 MCG/ACT nasal spray Place 2 sprays into both nostrils daily. 16 g 6  . lansoprazole (PREVACID) 15 MG capsule Take 1 capsule (15 mg total) by mouth daily at 12 noon. 90 capsule 3  . loratadine (CLARITIN) 10 MG tablet Take 1 tablet (10 mg total) by mouth daily. 30 tablet 11  . naproxen (NAPROSYN) 500 MG tablet Take 1 tablet (500 mg total) by mouth 2 (two) times daily with a meal. 180 tablet 0  . Olmesartan-amLODIPine-HCTZ 40-5-25 MG TABS Take 1 tablet by mouth daily. 90 tablet 3  . topiramate (TOPAMAX) 25 MG tablet TAKE 1 TABLET BY MOUTH ONCE DAILY 90 tablet 2   No current facility-administered medications on file prior to visit.     BP (!) 138/92   Temp 98.7 F (37.1 C)   Wt 188 lb (85.3 kg)   BMI 28.59 kg/m       Objective:   Physical Exam Vitals signs and nursing note reviewed.  Constitutional:       Appearance: Normal appearance.  Cardiovascular:     Rate and Rhythm: Normal rate and regular rhythm.     Pulses: Normal pulses.     Heart sounds: Normal heart sounds.  Pulmonary:     Effort: Pulmonary effort is normal.     Breath sounds: Normal breath sounds.  Skin:    General: Skin is warm and dry.  Neurological:     General: No focal deficit present.     Mental Status: She is alert and oriented to person, place, and time.     Cranial Nerves: No cranial nerve deficit.     Sensory: No sensory deficit.     Motor: No weakness.     Coordination: Coordination normal.     Comments: Positive Tinel sign and Phalen's test  Psychiatric:        Mood and Affect: Mood normal.        Behavior: Behavior normal.        Thought Content: Thought content normal.        Judgment: Judgment normal.       Assessment & Plan:  1. Bilateral carpal tunnel syndrome -We discussed treatment options ideally she is probably going to need some type of surgical procedure in the future.  For the time being she would like to redo the prednisone therapy since she has responded well to this in the past.  Advised follow-up if symptoms do not improve - predniSONE (DELTASONE) 10 MG tablet; 20 mg x 7 days, 10 mg x 7 days  Dispense: 21 tablet; Refill: 0  Shirline Frees, NP

## 2018-04-15 ENCOUNTER — Encounter: Payer: Self-pay | Admitting: Adult Health

## 2018-04-17 ENCOUNTER — Encounter: Payer: Self-pay | Admitting: Adult Health

## 2018-04-17 NOTE — Telephone Encounter (Signed)
predniSONE (DELTASONE) 10 MG tablet Is the medication that fell out of the bottle. About 6 tablets

## 2018-04-18 ENCOUNTER — Other Ambulatory Visit: Payer: Self-pay | Admitting: Adult Health

## 2018-04-18 MED ORDER — PREDNISONE 10 MG PO TABS: 10.0000 mg | ORAL_TABLET | Freq: Every day | ORAL | 0 refills | Status: DC

## 2018-04-21 MED FILL — predniSONE 10 MG TABS: 10 | 6 days supply | Qty: 6 | Fill #0

## 2018-04-26 MED FILL — OLMSRTN-AMLDPN-HCTZ 40-5-25: 40-5-25 | 30 days supply | Qty: 30 | Fill #2

## 2018-05-01 ENCOUNTER — Ambulatory Visit (INDEPENDENT_AMBULATORY_CARE_PROVIDER_SITE_OTHER): Payer: Self-pay | Admitting: Family Medicine

## 2018-05-01 ENCOUNTER — Other Ambulatory Visit: Payer: Self-pay

## 2018-05-01 DIAGNOSIS — Z111 Encounter for screening for respiratory tuberculosis: Secondary | ICD-10-CM

## 2018-05-01 NOTE — Progress Notes (Signed)
Patient presents for PPD placement for work. Denies previous positive TB test  Denies known exposure to TB   Tuberculin skin test applied to left ventral forearm.  Patient informed to return to [facility] in 48-72 hours for PPD read. Vaccine Information Statement provided to patient.   

## 2018-05-03 LAB — TB SKIN TEST
Induration: NEGATIVE mm
TB Skin Test: NEGATIVE

## 2018-05-04 ENCOUNTER — Encounter: Payer: Self-pay | Admitting: Adult Health

## 2018-05-12 ENCOUNTER — Encounter: Payer: Self-pay | Admitting: Adult Health

## 2018-05-12 ENCOUNTER — Other Ambulatory Visit: Payer: Self-pay

## 2018-05-12 ENCOUNTER — Ambulatory Visit (INDEPENDENT_AMBULATORY_CARE_PROVIDER_SITE_OTHER): Payer: 59 | Admitting: Adult Health

## 2018-05-12 DIAGNOSIS — G5603 Carpal tunnel syndrome, bilateral upper limbs: Secondary | ICD-10-CM

## 2018-05-12 MED ORDER — PREDNISONE 20 MG PO TABS: 20.0000 mg | ORAL_TABLET | Freq: Every day | ORAL | 0 refills | Status: DC

## 2018-05-12 MED FILL — predniSONE 20 MG TABS: 20 | 10 days supply | Qty: 10 | Fill #0

## 2018-05-12 NOTE — Progress Notes (Signed)
Virtual Visit via Video Note  I connected with Carlisle Beers on 05/12/18 at  1:30 PM EDT by a video enabled telemedicine application and verified that I am speaking with the correct person using two identifiers.  Location patient: home Location provider:work or home office Persons participating in the virtual visit: patient, provider  I discussed the limitations of evaluation and management by telemedicine and the availability of in person appointments. The patient expressed understanding and agreed to proceed.   HPI: 48 year old female who is being evaluated for follow-up regarding bilateral carpal tunnel syndrome.  She was seen approximately 1 month ago described a 10-day course of prednisone.  Ports while taking prednisone her pain subsided significantly but as soon as she came off the prednisone the pain returned.  She endorses decreased grip strength, numbness and tingling in her fingers, and a painful sensation that radiates up her forearms that times.  Discomfort is worse in the evening.  She is been using Tylenol without relief and refuses to take ibuprofen at this time due to concerns of COVID-19.  He is also using her wrists braces at night without much improvement   ROS: See pertinent positives and negatives per HPI.  Past Medical History:  Diagnosis Date  . Depression   . Hypertension    high blood pressure readings     Past Surgical History:  Procedure Laterality Date  . ABDOMINAL HYSTERECTOMY  2011    Family History  Problem Relation Age of Onset  . Breast cancer Paternal Grandmother   . Prostate cancer Father   . Hypertension Unknown        both sides       Current Outpatient Medications:  .  cetirizine (ZYRTEC) 10 MG tablet, Take 1 tablet (10 mg total) by mouth daily., Disp: 30 tablet, Rfl: 11 .  citalopram (CELEXA) 10 MG tablet, Take 1 tablet (10 mg total) by mouth daily., Disp: 30 tablet, Rfl: 2 .  cyclobenzaprine (FLEXERIL) 5 MG tablet, Take 1 tablet (5 mg  total) by mouth 3 (three) times daily as needed., Disp: 90 tablet, Rfl: 1 .  fluticasone (FLONASE) 50 MCG/ACT nasal spray, Place 2 sprays into both nostrils daily., Disp: 16 g, Rfl: 6 .  lansoprazole (PREVACID) 15 MG capsule, Take 1 capsule (15 mg total) by mouth daily at 12 noon., Disp: 90 capsule, Rfl: 3 .  loratadine (CLARITIN) 10 MG tablet, Take 1 tablet (10 mg total) by mouth daily., Disp: 30 tablet, Rfl: 11 .  naproxen (NAPROSYN) 500 MG tablet, Take 1 tablet (500 mg total) by mouth 2 (two) times daily with a meal., Disp: 180 tablet, Rfl: 0 .  Olmesartan-amLODIPine-HCTZ 40-5-25 MG TABS, Take 1 tablet by mouth daily., Disp: 90 tablet, Rfl: 3 .  predniSONE (DELTASONE) 20 MG tablet, Take 1 tablet (20 mg total) by mouth daily with breakfast., Disp: 10 tablet, Rfl: 0 .  topiramate (TOPAMAX) 25 MG tablet, TAKE 1 TABLET BY MOUTH ONCE DAILY, Disp: 90 tablet, Rfl: 2  EXAM:  VITALS per patient if applicable:  GENERAL: alert, oriented, appears well and in no acute distress  HEENT: atraumatic, conjunttiva clear, no obvious abnormalities on inspection of external nose and ears  NECK: normal movements of the head and neck  LUNGS: on inspection no signs of respiratory distress, breathing rate appears normal, no obvious gross SOB, gasping or wheezing  CV: no obvious cyanosis  MS: moves all visible extremities without noticeable abnormality  PSYCH/NEURO: pleasant and cooperative, no obvious depression or anxiety, speech and thought  processing grossly intact  ASSESSMENT AND PLAN:  Discussed the following assessment and plan:  Bilateral carpal tunnel syndrome - Plan: Ambulatory referral to Sports Medicine, predniSONE (DELTASONE) 20 MG tablet  Discussed long-term prednisone use, willing to give her 10 more days of 20 mg prednisone to help subside the discomfort.  Will refer her to sports medicine for further evaluation.   I discussed the assessment and treatment plan with the patient. The patient  was provided an opportunity to ask questions and all were answered. The patient agreed with the plan and demonstrated an understanding of the instructions.   The patient was advised to call back or seek an in-person evaluation if the symptoms worsen or if the condition fails to improve as anticipated.   Shirline Frees, NP

## 2018-05-17 ENCOUNTER — Ambulatory Visit: Payer: Self-pay

## 2018-05-17 ENCOUNTER — Other Ambulatory Visit: Payer: Self-pay

## 2018-05-17 ENCOUNTER — Ambulatory Visit: Payer: 59 | Admitting: Family Medicine

## 2018-05-17 ENCOUNTER — Encounter: Payer: Self-pay | Admitting: Family Medicine

## 2018-05-17 VITALS — BP 130/90 | HR 64 | Ht 68.0 in | Wt 188.0 lb

## 2018-05-17 DIAGNOSIS — G5603 Carpal tunnel syndrome, bilateral upper limbs: Secondary | ICD-10-CM

## 2018-05-17 DIAGNOSIS — M25531 Pain in right wrist: Secondary | ICD-10-CM

## 2018-05-17 DIAGNOSIS — M25532 Pain in left wrist: Principal | ICD-10-CM

## 2018-05-17 MED ORDER — GABAPENTIN 100 MG PO CAPS: 200.0000 mg | ORAL_CAPSULE | Freq: Every day | ORAL | 3 refills | Status: DC

## 2018-05-17 MED ORDER — DICLOFENAC SODIUM 2 % TD SOLN: 2.0000 g | Freq: Two times a day (BID) | TRANSDERMAL | 3 refills | Status: DC

## 2018-05-17 MED FILL — GABAPENTIN 100 MG CAPSULE: 100 | 30 days supply | Qty: 60 | Fill #0

## 2018-05-17 NOTE — Progress Notes (Signed)
[name] [address] [place], [place] [address] Phone: [phone] Subjective:   I Valerie Gilmore am serving as a Neurosurgeon for Dr. Antoine Primas.  I'm seeing this patient by the request  of:  Nafziger, Kandee Keen, NP   CC: bilateral wrist pain   APO:LIDCVUDTHY  Valerie Gilmore is a 48 y.o. female coming in with complaint of bilateral hand pain. Believes it is carpel tunnel. 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 States that she doesn't give herself time to rest. Pain with numbness at night. Fingers get stiff. More numbness than stiffness. Weakness with no loss of ROM.   Onset- Chronic  Location-bilateral wrist and hands Character-throbbing aching sensation. Aggravating factors- Reliving factors- topical, ice works better than heat Therapies tried-  Severity-7 out of 10    Seen by her primary care provider previously.  Diagnosed with carpal tunnel and responded well to a 10-day course of prednisone the pain came back immediately.  Past Medical History:  Diagnosis Date  . Depression   . Hypertension    high blood pressure readings    Past Surgical History:  Procedure Laterality Date  . ABDOMINAL HYSTERECTOMY  2011   Social History   Socioeconomic History  . Marital status: Single    Spouse name: Not on file  . Number of children: Not on file  . Years of education: Not on file  . Highest education level: Not on file  Occupational History  . Not on file  Social Needs  . Financial resource strain: Not on file  . Food insecurity:    Worry: Not on file    Inability: Not on file  . Transportation needs:    Medical: Not on file    Non-medical: Not on file  Tobacco Use  . Smoking status: Never Smoker  . Smokeless tobacco: Never Used  Substance and Sexual Activity  . Alcohol use: No    Alcohol/week: 0.0 standard drinks  . Drug use: No  . Sexual activity: Not on file  Lifestyle  . Physical activity:    Days per week: Not on file   Minutes per session: Not on file  . Stress: Not on file  Relationships  . Social connections:    Talks on phone: Not on file    Gets together: Not on file    Attends religious service: Not on file    Active member of club or organization: Not on file    Attends meetings of clubs or organizations: Not on file    Relationship status: Not on file  Other Topics Concern  . Not on file  Social History Narrative  . Not on file   Allergies  Allergen Reactions  . Other Rash    Powder gloves cause a rash   Family History  Problem Relation Age of Onset  . Breast cancer Paternal Grandmother   . Prostate cancer Father   . Hypertension Unknown        both sides     Current Outpatient Medications (Endocrine & Metabolic):  .  predniSONE (DELTASONE) 20 MG tablet, Take 1 tablet (20 mg total) by mouth daily with breakfast.  Current Outpatient Medications (Cardiovascular):  Marland Kitchen  Olmesartan-amLODIPine-HCTZ 40-5-25 MG TABS, Take 1 tablet by mouth daily.  Current Outpatient Medications (Respiratory):  .  cetirizine (ZYRTEC) 10 MG tablet, Take 1 tablet (10 mg total) by mouth daily. .  fluticasone (FLONASE) 50 MCG/ACT nasal spray, Place 2 sprays into both nostrils daily. Marland Kitchen  loratadine (CLARITIN) 10 MG tablet, Take 1 tablet (10 mg total) by mouth daily.  Current Outpatient Medications (Analgesics):  .  naproxen (NAPROSYN) 500 MG tablet, Take 1 tablet (500 mg total) by mouth 2 (two) times daily with a meal.   Current Outpatient Medications (Other):  .  citalopram (CELEXA) 10 MG tablet, Take 1 tablet (10 mg total) by mouth daily. .  cyclobenzaprine (FLEXERIL) 5 MG tablet, Take 1 tablet (5 mg total) by mouth 3 (three) times daily as needed. .  lansoprazole (PREVACID) 15 MG capsule, Take 1 capsule (15 mg total) by mouth daily at 12 noon. .  topiramate (TOPAMAX) 25 MG tablet, TAKE 1 TABLET BY MOUTH ONCE DAILY .  Diclofenac Sodium (PENNSAID) 2 % SOLN, Place 2 g onto the skin 2 (two) times daily. Marland Kitchen.   gabapentin (NEURONTIN) 100 MG capsule, Take 2 capsules (200 mg total) by mouth at bedtime.    Past medical history, social, surgical and family history all reviewed in electronic medical record.  No pertanent information unless stated regarding to the chief complaint.   Review of Systems:  No headache, visual changes, nausea, vomiting, diarrhea, constipation, dizziness, abdominal pain, skin rash, fevers, chills, night sweats, weight loss, swollen lymph nodes, body aches, joint swelling,  chest pain, shortness of breath, mood changes.  Positive muscle aches and numbness of the hands  Objective  Blood pressure 130/90, pulse 64, height 5\' 8"  (1.727 m), weight 188 lb (85.3 kg), SpO2 98 %.   General: No apparent distress alert and oriented x3 mood and affect normal, dressed appropriately.  HEENT: Pupils equal, extraocular movements intact  Respiratory: Patient's speak in full sentences and does not appear short of breath  Cardiovascular: No lower extremity edema, non tender, no erythema  Skin: Warm dry intact with no signs of infection or rash on extremities or on axial skeleton.  Abdomen: Soft nontender  Neuro: Cranial nerves II through XII are intact, neurovascularly intact in all extremities with 2+ DTRs and 2+ pulses.  Lymph: No lymphadenopathy of posterior or anterior cervical chain or axillae bilaterally.  Gait normal with good balance and coordination.  MSK:  Non tender with full range of motion and good stability and symmetric strength and tone of shoulders, elbows, hip, knee and ankles bilaterally.  Wrist: Bilateral Inspection normal with no visible erythema or swelling. ROM smooth and normal with good flexion and extension and ulnar/radial deviation that is symmetrical with opposite wrist. Palpation is normal over metacarpals, navicular, lunate, and TFCC; tendons without tenderness/ swelling No snuffbox tenderness. No tenderness over Canal of Guyon. Strength 5/5 in all directions  without pain. Negative Finkelstein, severely positive Tinel's and phalens. Negative Watson's test.  MSK US performed of: Bilateral wrist This study was ordered, performed, and interpreted by Terrilee FilesZach  D.O.  Wrist: All extensor compartments visualized and tendons all normal in appearance without fraying, tears, or sheath effusions. No effusion seen. TFCC intact. Scapholunate ligament intact. Carpal tunnel visualized severe increase in diameter of both median nerves.  Area measures 0.3 cm.  Mild swelling within the sheath noted as well. IMPRESSION: Bilateral carpal tunnel  Procedure: Real-time Ultrasound Guided Injection of left carpal tunnel Device: GE Logiq Q7 Ultrasound guided injection is preferred based studies that show increased duration, increased effect, greater accuracy, decreased procedural pain, increased response rate with ultrasound guided versus blind injection.  Verbal informed consent obtained.  Time-out conducted.  Noted no overlying erythema, induration, or other signs of local infection.  Skin prepped in a sterile fashion.  Local anesthesia: Topical Ethyl chloride.  With sterile technique and under real time ultrasound guidance:  median nerve visualized.  23g 5/8 inch needle inserted distal to proximal approach into nerve sheath. Pictures taken nfor needle placement. Patient did have injection of 2 cc of 0.5% Marcaine, and 1 cc of Kenalog 40 mg/dL. Completed without difficulty  Pain immediately resolved suggesting accurate placement of the medication.  Advised to call if fevers/chills, erythema, induration, drainage, or persistent bleeding.  Images permanently stored and available for review in the ultrasound unit.  Impression: Technically successful ultrasound guided injection.    Impression and Recommendations:     This case required medical decision making of moderate complexity. The above documentation has been reviewed and is accurate and complete Judi Saa, DO       Note: This dictation was prepared with Dragon dictation along with smaller phrase technology. Any transcriptional errors that result from this process are unintentional.

## 2018-05-17 NOTE — Patient Instructions (Addendum)
Good to see you.  Ice 20 minutes 2 times daily. Usually after activity and before bed. Wear brace day and night for 1 week then nightly for 2 weeks.  pennsaid pinkie amount topically 2 times daily as needed.  Gabapentin 200mg  at night  Iron 65mg  with 500mg  of vitamin C daily to 3 times a week minimum Vitamin D 2000 IU daily  See me again in 1-2 weeks if you want the other one done as well

## 2018-05-17 NOTE — Assessment & Plan Note (Signed)
Patient given injection on the left side.  Brace on the right side of the moment.  We discussed with patient in great length.  We discussed that we can repeat injection or do the contralateral side if necessary.  Home exercises given and work with Event organiser, discussed icing regimen.  Discussed avoiding repetitive activities when possible.  Patient will be following up in the next week for the contralateral side to examine and possible hip injections.

## 2018-05-22 ENCOUNTER — Encounter: Payer: Self-pay | Admitting: Family Medicine

## 2018-05-24 ENCOUNTER — Ambulatory Visit: Payer: 59 | Admitting: Family Medicine

## 2018-05-24 NOTE — Progress Notes (Signed)
Valerie Gilmore Heino D.O. [facility] [address] [place], [place] [address] Phone: [phone] Subjective:    I'm seeing this patient by the request  of:    CC: Right wrist pain follow-up  BJY:[id number]:Subjective   05/17/2018: Patient given injection on the left side.  Brace on the right side of the moment.  We discussed with patient in great length.  We discussed that we can repeat injection or do the contralateral side if necessary.  Home exercises given and work with Event organiserathletic trainer, discussed icing regimen.  Discussed avoiding repetitive activities when possible.  Patient will be following up in the next week for the contralateral side to examine and possible hip injections.  Update 05/25/2018: Valerie Gilmore is a 48 y.o. female coming in with complaint of bilateral wrist pain.  Was found to have carpal tunnel bilaterally.  Given an injection in the left wrist previously.  85 to 90% better.  No longer having any weakness or pain.  Mild intermittent numbness on that side.  Unfortunately right side continues to have pain then.     Past Medical History:  Diagnosis Date  . Depression   . Hypertension    high blood pressure readings    Past Surgical History:  Procedure Laterality Date  . ABDOMINAL HYSTERECTOMY  2011   Social History   Socioeconomic History  . Marital status: Single    Spouse name: Not on file  . Number of children: Not on file  . Years of education: Not on file  . Highest education level: Not on file  Occupational History  . Not on file  Social Needs  . Financial resource strain: Not on file  . Food insecurity:    Worry: Not on file    Inability: Not on file  . Transportation needs:    Medical: Not on file    Non-medical: Not on file  Tobacco Use  . Smoking status: Never Smoker  . Smokeless tobacco: Never Used  Substance and Sexual Activity  . Alcohol use: No    Alcohol/week: 0.0 standard drinks  . Drug use: No  . Sexual activity: Not on file   Lifestyle  . Physical activity:    Days per week: Not on file    Minutes per session: Not on file  . Stress: Not on file  Relationships  . Social connections:    Talks on phone: Not on file    Gets together: Not on file    Attends religious service: Not on file    Active member of club or organization: Not on file    Attends meetings of clubs or organizations: Not on file    Relationship status: Not on file  Other Topics Concern  . Not on file  Social History Narrative  . Not on file   Allergies  Allergen Reactions  . Other Rash    Powder gloves cause a rash   Family History  Problem Relation Age of Onset  . Breast cancer Paternal Grandmother   . Prostate cancer Father   . Hypertension Unknown        both sides     Current Outpatient Medications (Endocrine & Metabolic):  .  predniSONE (DELTASONE) 20 MG tablet, Take 1 tablet (20 mg total) by mouth daily with breakfast.  Current Outpatient Medications (Cardiovascular):  Marland Kitchen.  Olmesartan-amLODIPine-HCTZ 40-5-25 MG TABS, Take 1 tablet by mouth daily.  Current Outpatient Medications (Respiratory):  .  cetirizine (ZYRTEC) 10 MG tablet, Take 1 tablet (10  mg total) by mouth daily. .  fluticasone (FLONASE) 50 MCG/ACT nasal spray, Place 2 sprays into both nostrils daily. Marland Kitchen  loratadine (CLARITIN) 10 MG tablet, Take 1 tablet (10 mg total) by mouth daily.  Current Outpatient Medications (Analgesics):  .  naproxen (NAPROSYN) 500 MG tablet, Take 1 tablet (500 mg total) by mouth 2 (two) times daily with a meal.   Current Outpatient Medications (Other):  .  citalopram (CELEXA) 10 MG tablet, Take 1 tablet (10 mg total) by mouth daily. .  cyclobenzaprine (FLEXERIL) 5 MG tablet, Take 1 tablet (5 mg total) by mouth 3 (three) times daily as needed. .  Diclofenac Sodium (PENNSAID) 2 % SOLN, Place 2 g onto the skin 2 (two) times daily. Marland Kitchen  gabapentin (NEURONTIN) 100 MG capsule, Take 2 capsules (200 mg total) by mouth at bedtime. .   lansoprazole (PREVACID) 15 MG capsule, Take 1 capsule (15 mg total) by mouth daily at 12 noon. .  topiramate (TOPAMAX) 25 MG tablet, TAKE 1 TABLET BY MOUTH ONCE DAILY    Past medical history, social, surgical and family history all reviewed in electronic medical record.  No pertanent information unless stated regarding to the chief complaint.   Review of Systems:  No headache, visual changes, nausea, vomiting, diarrhea, constipation, dizziness, abdominal pain, skin rash, fevers, chills, night sweats, weight loss, swollen lymph nodes, body aches, joint swelling, muscle aches, chest pain, shortness of breath, mood changes.   Objective  Blood pressure 122/74, pulse 60, height 5\' 8"  (1.727 m), weight 188 lb (85.3 kg), SpO2 98 %.     General: No apparent distress alert and oriented x3 mood and affect normal, dressed appropriately.  Patient is in a mask HEENT: Pupils equal, extraocular movements intact  Respiratory: Patient's speak in full sentences and does not appear short of breath  Cardiovascular: No lower extremity edema, non tender, no erythema  Skin: Warm dry intact with no signs of infection or rash on extremities or on axial skeleton.  Abdomen: Soft nontender  Neuro: Cranial nerves II through XII are intact, neurovascularly intact in all extremities with 2+ DTRs and 2+ pulses.  Lymph: No lymphadenopathy of posterior or anterior cervical chain or axillae bilaterally.  Gait normal with good balance and coordination.  MSK:  Non tender with full range of motion and good stability and symmetric strength and tone of shoulders, elbows, hip, knee and ankles bilaterally.  Wrist: Right Inspection normal with no visible erythema or swelling. Positive Tinel sign.  Patient does have tenderness with Tinel's.  Grip strength is near symmetric to the contralateral side  Procedure: Real-time Ultrasound Guided Injection of right carpal tunnel Device: GE Logiq Q7  Ultrasound guided injection is  preferred based studies that show increased duration, increased effect, greater accuracy, decreased procedural pain, increased response rate with ultrasound guided versus blind injection.  Verbal informed consent obtained.  Time-out conducted.  Noted no overlying erythema, induration, or other signs of local infection.  Skin prepped in a sterile fashion.  Local anesthesia: Topical Ethyl chloride.  With sterile technique and under real time ultrasound guidance:  median nerve visualized.  23g 5/8 inch needle inserted distal to proximal approach into nerve sheath. Pictures taken nfor needle placement. Patient did have injection of 2 cc of 1% lidocaine, 1 cc of 0.5% Marcaine, and 1 cc of Kenalog 40 mg/dL. Completed without difficulty  Pain immediately resolved suggesting accurate placement of the medication.  Advised to call if fevers/chills, erythema, induration, drainage, or persistent bleeding.  Images permanently  stored and available for review in the ultrasound unit.  Impression: Technically successful ultrasound guided injection.     Impression and Recommendations:     This case required medical decision making of moderate complexity. The above documentation has been reviewed and is accurate and complete Judi Saa, DO       Note: This dictation was prepared with Dragon dictation along with smaller phrase technology. Any transcriptional errors that result from this process are unintentional.

## 2018-05-25 ENCOUNTER — Ambulatory Visit: Payer: 59 | Admitting: Family Medicine

## 2018-05-25 ENCOUNTER — Other Ambulatory Visit: Payer: Self-pay

## 2018-05-25 ENCOUNTER — Encounter: Payer: Self-pay | Admitting: Family Medicine

## 2018-05-25 ENCOUNTER — Ambulatory Visit: Payer: Self-pay

## 2018-05-25 VITALS — BP 122/74 | HR 60 | Ht 68.0 in | Wt 188.0 lb

## 2018-05-25 DIAGNOSIS — M25531 Pain in right wrist: Secondary | ICD-10-CM

## 2018-05-25 DIAGNOSIS — G5603 Carpal tunnel syndrome, bilateral upper limbs: Secondary | ICD-10-CM

## 2018-05-25 NOTE — Assessment & Plan Note (Signed)
Patient given injection.  Tolerated the procedure well.  Patient did have some numbness after the injection.  No signs of any type of swelling afterwards.  Patient knows the home exercises, icing regimen, over-the-counter medications.  Follow-up again 6 weeks

## 2018-05-25 NOTE — Patient Instructions (Addendum)
Great to see you  Injected the other side  You should do great  See me again in 6 weeks Stay safe!

## 2018-05-31 MED FILL — OLMSRTN-AMLDPN-HCTZ 40-5-25: 40-5-25 | 30 days supply | Qty: 30 | Fill #3

## 2018-06-29 ENCOUNTER — Other Ambulatory Visit: Payer: Self-pay

## 2018-06-29 ENCOUNTER — Ambulatory Visit: Payer: 59 | Admitting: Adult Health

## 2018-06-29 ENCOUNTER — Ambulatory Visit (INDEPENDENT_AMBULATORY_CARE_PROVIDER_SITE_OTHER): Payer: 59

## 2018-06-29 ENCOUNTER — Encounter: Payer: Self-pay | Admitting: Adult Health

## 2018-06-29 VITALS — BP 130/90 | Temp 98.5°F | Wt 188.0 lb

## 2018-06-29 DIAGNOSIS — R0789 Other chest pain: Secondary | ICD-10-CM

## 2018-06-29 DIAGNOSIS — R079 Chest pain, unspecified: Secondary | ICD-10-CM | POA: Diagnosis not present

## 2018-06-29 NOTE — Progress Notes (Signed)
Subjective:    Patient ID: Valerie Gilmore, female    DOB: 16-Apr-1970, 48 y.o.   MRN: [id number]  HPI 48 year old female who  has a past medical history of Depression and Hypertension.  She presents to the office today for an acute on chronic issue of "left-sided chest pressure".  She was first evaluated for this in December 2019 at which time it had been present for approximately 2 weeks intermittently before she was seen.  The pressure radiated to her left shoulder and under her left breast and she felt as though her symptoms were more prevalent during times of stress.  This time she did report episodes of shortness of breath but these were infrequent.  This thought that the likely contributing cause was anxiety and she was started on Celexa 10 mg.  Today she reports that her symptoms had resolved but over the last month she has been experiencing "the same symptoms" feels as though this continues to happen during times of stress and happens more at work, where she does not like the people that she works with, this causes a lot of stress and anxiety in her life.  She denies pain but does feel "soreness" across both sides of her chest with palpation and she does do a lot of heavy lifting and pushing and pulling at work.  Denies shortness of breath  She does not want to increase Celexa at this point   Review of Systems See HPI   Past Medical History:  Diagnosis Date  . Depression   . Hypertension    high blood pressure readings     Social History   Socioeconomic History  . Marital status: Single    Spouse name: Not on file  . Number of children: Not on file  . Years of education: Not on file  . Highest education level: Not on file  Occupational History  . Not on file  Social Needs  . Financial resource strain: Not on file  . Food insecurity:    Worry: Not on file    Inability: Not on file  . Transportation needs:    Medical: Not on file    Non-medical: Not on file  Tobacco  Use  . Smoking status: Never Smoker  . Smokeless tobacco: Never Used  Substance and Sexual Activity  . Alcohol use: No    Alcohol/week: 0.0 standard drinks  . Drug use: No  . Sexual activity: Not on file  Lifestyle  . Physical activity:    Days per week: Not on file    Minutes per session: Not on file  . Stress: Not on file  Relationships  . Social connections:    Talks on phone: Not on file    Gets together: Not on file    Attends religious service: Not on file    Active member of club or organization: Not on file    Attends meetings of clubs or organizations: Not on file    Relationship status: Not on file  . Intimate partner violence:    Fear of current or ex partner: Not on file    Emotionally abused: Not on file    Physically abused: Not on file    Forced sexual activity: Not on file  Other Topics Concern  . Not on file  Social History Narrative  . Not on file    Past Surgical History:  Procedure Laterality Date  . ABDOMINAL HYSTERECTOMY  2011    Family History  Problem Relation Age of Onset  . Breast cancer Paternal Grandmother   . Prostate cancer Father   . Hypertension Unknown        both sides     Allergies  Allergen Reactions  . Other Rash    Powder gloves cause a rash    Current Outpatient Medications on File Prior to Visit  Medication Sig Dispense Refill  . cetirizine (ZYRTEC) 10 MG tablet Take 1 tablet (10 mg total) by mouth daily. 30 tablet 11  . citalopram (CELEXA) 10 MG tablet Take 1 tablet (10 mg total) by mouth daily. 30 tablet 2  . fluticasone (FLONASE) 50 MCG/ACT nasal spray Place 2 sprays into both nostrils daily. 16 g 6  . lansoprazole (PREVACID) 15 MG capsule Take 1 capsule (15 mg total) by mouth daily at 12 noon. 90 capsule 3  . loratadine (CLARITIN) 10 MG tablet Take 1 tablet (10 mg total) by mouth daily. 30 tablet 11  . Olmesartan-amLODIPine-HCTZ 40-5-25 MG TABS Take 1 tablet by mouth daily. 90 tablet 3   No current  facility-administered medications on file prior to visit.     BP 130/90   Temp 98.5 F (36.9 C)   Wt 188 lb (85.3 kg)   BMI 28.59 kg/m       Objective:   Physical Exam Vitals signs and nursing note reviewed.  Constitutional:      Appearance: Normal appearance.  Cardiovascular:     Rate and Rhythm: Normal rate and regular rhythm.     Pulses: Normal pulses.     Heart sounds: Normal heart sounds. No murmur. No friction rub. No gallop.   Pulmonary:     Effort: Pulmonary effort is normal. No respiratory distress.     Breath sounds: Normal breath sounds. No stridor. No wheezing, rhonchi or rales.     Comments: Producible chest wall pain throughout chest wall, most notable around sternum and in the intercostal spaces under left breast. Chest:     Chest wall: Tenderness present. No mass, crepitus or edema.     Breasts: Breasts are symmetrical.   Musculoskeletal: Normal range of motion.  Skin:    General: Skin is warm and dry.     Capillary Refill: Capillary refill takes less than 2 seconds.  Neurological:     General: No focal deficit present.     Mental Status: She is alert and oriented to person, place, and time.  Psychiatric:        Mood and Affect: Mood normal.        Behavior: Behavior normal.        Thought Content: Thought content normal.        Judgment: Judgment normal.       Assessment & Plan:  1. Chest wall pain -Appears to be more musculoskeletal likely costochondritis.  Advise anti-inflammatories.  Possibly increasing Celexa would help out with symptoms of anxiety but she does not want to do this at this time.  Advised follow-up as needed - DG Chest 2 View; Future  Shirline Freesory Florena Kozma, NP

## 2018-07-04 MED FILL — OLMSRTN-AMLDPN-HCTZ 40-5-25: 40-5-25 | 30 days supply | Qty: 30 | Fill #4

## 2018-07-31 MED FILL — IBUPROFEN 800 MG TAB: 800 | 30 days supply | Qty: 90 | Fill #2

## 2018-08-01 ENCOUNTER — Telehealth: Payer: Self-pay | Admitting: Family Medicine

## 2018-08-01 NOTE — Telephone Encounter (Signed)
Doesn't look like she has seen Neurology in a few years, we will need to fill this out

## 2018-08-01 NOTE — Telephone Encounter (Signed)
Should Neuro fill out FMLA?

## 2018-08-01 NOTE — Telephone Encounter (Signed)
Form received

## 2018-08-01 NOTE — Telephone Encounter (Signed)
Copied from [place] [id number]. Topic: General - Other >> Jul 31, 2018  2:16 PM Marin Olp L wrote: Reason for CRM: Patient would like a call back from [facility] to discuss her FMLA ppw that she is dropping off today to make sure Tommi Rumps has all of the info he needs to complete them.

## 2018-08-01 NOTE — Telephone Encounter (Signed)
Patient dropped off FMLA forms  Fax forms to [phone]  Disposition: [facility]

## 2018-08-02 NOTE — Telephone Encounter (Signed)
FILLED OUT AND PLACED IN CORY'S FOLDER.

## 2018-08-03 DIAGNOSIS — Z0279 Encounter for issue of other medical certificate: Secondary | ICD-10-CM

## 2018-08-03 NOTE — Telephone Encounter (Signed)
Paper work faxed to [place] and received confirmation the transmission was successful.  Copy sent to scan.  Nothing further needed

## 2018-08-04 ENCOUNTER — Encounter: Payer: Self-pay | Admitting: Adult Health

## 2018-08-07 MED FILL — OLMSRTN-AMLDPN-HCTZ 40-5-25: 40-5-25 | 30 days supply | Qty: 30 | Fill #5

## 2018-08-08 ENCOUNTER — Encounter: Payer: Self-pay | Admitting: Family Medicine

## 2018-08-10 DIAGNOSIS — I10 Essential (primary) hypertension: Secondary | ICD-10-CM | POA: Diagnosis not present

## 2018-08-10 DIAGNOSIS — Z6829 Body mass index (BMI) 29.0-29.9, adult: Secondary | ICD-10-CM | POA: Diagnosis not present

## 2018-08-10 DIAGNOSIS — M4807 Spinal stenosis, lumbosacral region: Secondary | ICD-10-CM | POA: Diagnosis not present

## 2018-08-10 DIAGNOSIS — M5416 Radiculopathy, lumbar region: Secondary | ICD-10-CM | POA: Diagnosis not present

## 2018-08-10 MED FILL — MELOXICAM 15 MG TABLET: 15 | 30 days supply | Qty: 30 | Fill #0

## 2018-08-15 ENCOUNTER — Encounter: Payer: Self-pay | Admitting: Family Medicine

## 2018-08-15 ENCOUNTER — Other Ambulatory Visit: Payer: Self-pay

## 2018-08-15 ENCOUNTER — Ambulatory Visit: Payer: 59 | Admitting: Family Medicine

## 2018-08-15 ENCOUNTER — Ambulatory Visit: Payer: Self-pay

## 2018-08-15 VITALS — BP 122/90 | HR 73 | Ht 68.0 in | Wt 189.0 lb

## 2018-08-15 DIAGNOSIS — M25531 Pain in right wrist: Secondary | ICD-10-CM

## 2018-08-15 DIAGNOSIS — G5603 Carpal tunnel syndrome, bilateral upper limbs: Secondary | ICD-10-CM

## 2018-08-15 NOTE — Progress Notes (Signed)
Tawana ScaleZach Smith D.O. [facility] [address] [place], [place] [address] Phone: [phone] Subjective:    I'm seeing this patient by the request  of:    CC: Right wrist pain  NUU:[id number]:Subjective   05/25/2018: Patient given injection.  Tolerated the procedure well.  Patient did have some numbness after the injection.  No signs of any type of swelling afterwards.  Patient knows the home exercises, icing regimen, over-the-counter medications.  Follow-up again 6 weeks  Update 08/15/2018: Valerie Gilmore is a 48 y.o. female coming in with complaint of right wrist pain. Has been using Pennsaid to help with pain. Pain is intermittent. Has swelling and tingling at end of her shift.  Known carpal tunnel.  Has responded well to the injections.  Patient states that the right side is starting to worsen again but not as severe.  Patient wants to decrease it again to trying to continue to be active.  Patient has been doing the home exercises and wants to become more consistent.     Past Medical History:  Diagnosis Date  . Depression   . Hypertension    high blood pressure readings    Past Surgical History:  Procedure Laterality Date  . ABDOMINAL HYSTERECTOMY  2011   Social History   Socioeconomic History  . Marital status: Single    Spouse name: Not on file  . Number of children: Not on file  . Years of education: Not on file  . Highest education level: Not on file  Occupational History  . Not on file  Social Needs  . Financial resource strain: Not on file  . Food insecurity    Worry: Not on file    Inability: Not on file  . Transportation needs    Medical: Not on file    Non-medical: Not on file  Tobacco Use  . Smoking status: Never Smoker  . Smokeless tobacco: Never Used  Substance and Sexual Activity  . Alcohol use: No    Alcohol/week: 0.0 standard drinks  . Drug use: No  . Sexual activity: Not on file  Lifestyle  . Physical activity    Days per week: Not on file   Minutes per session: Not on file  . Stress: Not on file  Relationships  . Social Musicianconnections    Talks on phone: Not on file    Gets together: Not on file    Attends religious service: Not on file    Active member of club or organization: Not on file    Attends meetings of clubs or organizations: Not on file    Relationship status: Not on file  Other Topics Concern  . Not on file  Social History Narrative  . Not on file   Allergies  Allergen Reactions  . Other Rash    Powder gloves cause a rash   Family History  Problem Relation Age of Onset  . Breast cancer Paternal Grandmother   . Prostate cancer Father   . Hypertension Unknown        both sides      Current Outpatient Medications (Cardiovascular):  Marland Kitchen.  Olmesartan-amLODIPine-HCTZ 40-5-25 MG TABS, Take 1 tablet by mouth daily.  Current Outpatient Medications (Respiratory):  .  cetirizine (ZYRTEC) 10 MG tablet, Take 1 tablet (10 mg total) by mouth daily. .  fluticasone (FLONASE) 50 MCG/ACT nasal spray, Place 2 sprays into both nostrils daily. Marland Kitchen.  loratadine (CLARITIN) 10 MG tablet, Take 1 tablet (10 mg total) by mouth daily.  Current Outpatient Medications (Other):  .  citalopram (CELEXA) 10 MG tablet, Take 1 tablet (10 mg total) by mouth daily. .  lansoprazole (PREVACID) 15 MG capsule, Take 1 capsule (15 mg total) by mouth daily at 12 noon.    Past medical history, social, surgical and family history all reviewed in electronic medical record.  No pertanent information unless stated regarding to the chief complaint.   Review of Systems:  No headache, visual changes, nausea, vomiting, diarrhea, constipation, dizziness, abdominal pain, skin rash, fevers, chills, night sweats, weight loss, swollen lymph nodes, body aches, joint swelling, muscle aches, chest pain, shortness of breath, mood changes.   Objective  Blood pressure 122/90, pulse 73, height 5\' 8"  (1.727 m), weight 189 lb (85.7 kg), SpO2 98 %. Systems examined  below as of    General: No apparent distress alert and oriented x3 mood and affect normal, dressed appropriately.  HEENT: Pupils equal, extraocular movements intact  Respiratory: Patient's speak in full sentences and does not appear short of breath  Cardiovascular: No lower extremity edema, non tender, no erythema  Skin: Warm dry intact with no signs of infection or rash on extremities or on axial skeleton.  Abdomen: Soft nontender  Neuro: Cranial nerves II through XII are intact, neurovascularly intact in all extremities with 2+ DTRs and 2+ pulses.  Lymph: No lymphadenopathy of posterior or anterior cervical chain or axillae bilaterally.  Gait normal with good balance and coordination.  MSK:  Non tender with full range of motion and good stability and symmetric strength and tone of shoulders, elbows,  hip, knee and ankles bilaterally.  Right wrist shows the patient does have near full range of motion but still has a positive Tinel's and a positive Phalen's test.  Patient does have good grip strength.  About symmetric to the contralateral side.  Procedure: Real-time Ultrasound Guided Injection of right carpal tunnel Device: GE Logiq Q7  Ultrasound guided injection is preferred based studies that show increased duration, increased effect, greater accuracy, decreased procedural pain, increased response rate with ultrasound guided versus blind injection.  Verbal informed consent obtained.  Time-out conducted.  Noted no overlying erythema, induration, or other signs of local infection.  Skin prepped in a sterile fashion.  Local anesthesia: Topical Ethyl chloride.  With sterile technique and under real time ultrasound guidance:  median nerve visualized.  23g 5/8 inch needle inserted distal to proximal approach into nerve sheath. Pictures taken nfor needle placement. Patient did have injection of 2 cc of 1% lidocaine, 1 cc of 0.5% Marcaine, and 1 cc of Kenalog 40 mg/dL. Completed without difficulty   Pain immediately resolved suggesting accurate placement of the medication.  Advised to call if fevers/chills, erythema, induration, drainage, or persistent bleeding.  Images permanently stored and available for review in the ultrasound unit.  Impression: Technically successful ultrasound guided injection.   Impression and Recommendations:     This case required medical decision making of moderate complexity. The above documentation has been reviewed and is accurate and complete Lyndal Pulley, DO       Note: This dictation was prepared with Dragon dictation along with smaller phrase technology. Any transcriptional errors that result from this process are unintentional.

## 2018-08-15 NOTE — Assessment & Plan Note (Signed)
Repeat injection given today.  Discussed with the bracing.  Patient does not want to do the gabapentin regularly.  We discussed icing regimen and home exercise.  Follow-up again in 4 to 8 weeks

## 2018-08-23 ENCOUNTER — Encounter: Payer: Self-pay | Admitting: Adult Health

## 2018-08-24 ENCOUNTER — Encounter: Payer: Self-pay | Admitting: Adult Health

## 2018-08-25 ENCOUNTER — Encounter: Payer: Self-pay | Admitting: Family Medicine

## 2018-08-25 ENCOUNTER — Encounter: Payer: Self-pay | Admitting: Adult Health

## 2018-08-25 DIAGNOSIS — Z1231 Encounter for screening mammogram for malignant neoplasm of breast: Secondary | ICD-10-CM

## 2018-08-25 DIAGNOSIS — N644 Mastodynia: Secondary | ICD-10-CM

## 2018-08-25 NOTE — Telephone Encounter (Signed)
Order placed. Nothing further needed. 

## 2018-08-28 ENCOUNTER — Telehealth: Payer: Self-pay | Admitting: Adult Health

## 2018-08-28 NOTE — Telephone Encounter (Signed)
Pt came in and dropped off her FMLA form to be completed by the provider.  Upon completion pt would like for it to be faxed to [phone] attn: Unice Bailey.  Form was placed in providers folder for completion.

## 2018-08-29 ENCOUNTER — Encounter: Payer: Self-pay | Admitting: Adult Health

## 2018-08-29 NOTE — Telephone Encounter (Signed)
Form received

## 2018-08-31 ENCOUNTER — Encounter: Payer: Self-pay | Admitting: Family Medicine

## 2018-08-31 ENCOUNTER — Other Ambulatory Visit: Payer: Self-pay | Admitting: Adult Health

## 2018-08-31 DIAGNOSIS — N644 Mastodynia: Secondary | ICD-10-CM

## 2018-08-31 DIAGNOSIS — Z0279 Encounter for issue of other medical certificate: Secondary | ICD-10-CM

## 2018-08-31 NOTE — Telephone Encounter (Signed)
Form completed and pt notified.  Faxed to [employer].  Received confirmation the transmission was successful.  Nothing further needed.

## 2018-09-06 ENCOUNTER — Other Ambulatory Visit: Payer: Self-pay | Admitting: Adult Health

## 2018-09-08 MED FILL — OLMSRTN-AMLDPN-HCTZ 40-5-25: 40-5-25 | 30 days supply | Qty: 30 | Fill #6

## 2018-09-11 ENCOUNTER — Other Ambulatory Visit: Payer: 59

## 2018-09-20 DIAGNOSIS — M4807 Spinal stenosis, lumbosacral region: Secondary | ICD-10-CM | POA: Diagnosis not present

## 2018-09-22 ENCOUNTER — Other Ambulatory Visit: Payer: Self-pay

## 2018-09-22 ENCOUNTER — Ambulatory Visit
Admission: EM | Admit: 2018-09-22 | Discharge: 2018-09-22 | Disposition: A | Discharge status: Home / Self Care | Payer: 59 | Attending: Emergency Medicine | Admitting: Emergency Medicine

## 2018-09-22 ENCOUNTER — Encounter: Payer: Self-pay | Admitting: Emergency Medicine

## 2018-09-22 DIAGNOSIS — K219 Gastro-esophageal reflux disease without esophagitis: Secondary | ICD-10-CM

## 2018-09-22 DIAGNOSIS — R0789 Other chest pain: Secondary | ICD-10-CM | POA: Diagnosis not present

## 2018-09-22 MED ORDER — LANSOPRAZOLE 15 MG PO CPDR: 15.0000 mg | DELAYED_RELEASE_CAPSULE | Freq: Every day | ORAL | 0 refills | Status: DC

## 2018-09-22 NOTE — ED Provider Notes (Signed)
[name]    CSN: [id number] Arrival date & time: 09/22/18  1648     History   Chief Complaint Chief Complaint  Patient presents with  . Shortness of Breath    HPI Valerie Gilmore is a 48 y.o. female with history of hypertension, anxiety presenting for 1 week course of dyspnea on exertion, left-sided chest pressure that radiates to her left shoulder blade.  Patient states she will occasionally get an epigastric discomfort, she has a hard time articulating quality of that sensation.  Patient states that she has a hard time telling if her symptoms are related to her anxiety or not: Has had EKGs in the past without significant findings.  Of note, patient states that she had an episode of "just feeling off "while at work today.  Patient was able to take her blood pressure and had a reported reading of 88/60.  States by the time she left work it was 101/70.  Patient denies lightheadedness, loss of consciousness, change in vision, tinnitus, nausea, vomiting, abdominal pain.  Patient states that she feels better currently in office.  Patient denies known history of diabetes, though it does run in her family.  Patient routinely followed by her PCP: A1c done for diabetic screening 01/17/2018: 6.0%.  Of note, patient does have history of GERD, supposed to be taking lansoprazole that has been out "for a while ".  Patient denies epigastric burning sensation, reflux.   Past Medical History:  Diagnosis Date  . Depression   . Hypertension    high blood pressure readings     Patient Active Problem List   Diagnosis Date Noted  . Carpal tunnel syndrome 04/13/2018  . Degenerative disc disease at L5-S1 level 01/24/2018  . Generalized anxiety disorder 02/24/2017  . Essential hypertension 12/12/2013    Past Surgical History:  Procedure Laterality Date  . ABDOMINAL HYSTERECTOMY  2011    OB History   No obstetric history on file.      Home Medications    Prior to Admission  medications   Medication Sig Start Date End Date Taking? Authorizing Provider  cetirizine (ZYRTEC) 10 MG tablet Take 1 tablet (10 mg total) by mouth daily. 05/27/17  Yes Wallis BambergMani, Mario, PA-C  fluticasone (FLONASE) 50 MCG/ACT nasal spray Place 2 sprays into both nostrils daily. 05/27/17  Yes Wallis BambergMani, Mario, PA-C  Olmesartan-amLODIPine-HCTZ 40-5-25 MG TABS Take 1 tablet by mouth daily. 01/17/18  Yes Nafziger, Kandee Keenory, NP  citalopram (CELEXA) 10 MG tablet Take 1 tablet (10 mg total) by mouth daily. 01/10/18   Nafziger, Kandee Keenory, NP  lansoprazole (PREVACID) 15 MG capsule Take 1 capsule (15 mg total) by mouth daily at 12 noon. 09/22/18   Hall-Potvin, [place], PA-C  loratadine (CLARITIN) 10 MG tablet Take 1 tablet (10 mg total) by mouth daily. 05/27/17   Wallis BambergMani, Mario, PA-C    Family History Family History  Problem Relation Age of Onset  . Breast cancer Paternal Grandmother   . Prostate cancer Father   . Hypertension Other        both sides     Social History Social History   Tobacco Use  . Smoking status: Never Smoker  . Smokeless tobacco: Never Used  Substance Use Topics  . Alcohol use: No    Alcohol/week: 0.0 standard drinks  . Drug use: No     Allergies   Sulfa antibiotics and Other   Review of Systems Review of Systems  Constitutional: Negative for activity change, appetite change, fatigue and fever.  HENT: Negative for ear pain, sinus pain, sore throat and voice change.   Eyes: Negative for pain, redness and visual disturbance.  Respiratory: Positive for chest tightness. Negative for cough, choking, shortness of breath and wheezing.   Cardiovascular: Positive for chest pain. Negative for palpitations and leg swelling.  Gastrointestinal: Negative for abdominal pain, diarrhea and vomiting.  Musculoskeletal: Negative for arthralgias and myalgias.  Skin: Negative for rash and wound.  Neurological: Negative for dizziness, tremors, seizures, syncope, facial asymmetry, speech difficulty,  weakness, light-headedness, numbness and headaches.  Psychiatric/Behavioral: Negative for sleep disturbance and suicidal ideas. The patient is nervous/anxious.       Physical Exam Triage Vital Signs ED Triage Vitals  Enc Vitals Group     BP      Pulse      Resp      Temp      Temp src      SpO2      Weight      Height      Head Circumference      Peak Flow      Pain Score      Pain Loc      Pain Edu?      Excl. in [place]?    No data found.  Updated Vital Signs BP 136/87 (BP Location: Left Arm)   Pulse 72   Temp 97.9 F (36.6 C) (Oral)   Resp 18   SpO2 98%   Visual Acuity Right Eye Distance:   Left Eye Distance:   Bilateral Distance:    Right Eye Near:   Left Eye Near:    Bilateral Near:     Physical Exam Vitals signs reviewed.  Constitutional:      General: She is not in acute distress.    Appearance: She is normal weight. She is not ill-appearing.  HENT:     Head: Normocephalic and atraumatic.     Mouth/Throat:     Mouth: Mucous membranes are moist.     Pharynx: Oropharynx is clear.  Eyes:     General: No scleral icterus.       Right eye: No discharge.        Left eye: No discharge.     Extraocular Movements: Extraocular movements intact.     Pupils: Pupils are equal, round, and reactive to light.  Neck:     Musculoskeletal: Normal range of motion and neck supple. No muscular tenderness.  Cardiovascular:     Rate and Rhythm: Normal rate and regular rhythm.     Pulses: Normal pulses.     Heart sounds: No murmur. No friction rub. No gallop.   Pulmonary:     Effort: Pulmonary effort is normal. No accessory muscle usage or respiratory distress.     Breath sounds: No wheezing.  Chest:     Chest wall: No tenderness.  Abdominal:     General: Abdomen is flat.     Palpations: Abdomen is soft.     Tenderness: There is no abdominal tenderness. There is no guarding.  Lymphadenopathy:     Cervical: No cervical adenopathy.  Skin:    General: Skin is warm.      Capillary Refill: Capillary refill takes less than 2 seconds.     Coloration: Skin is not jaundiced or pale.     Findings: No rash.  Neurological:     General: No focal deficit present.     Mental Status: She is alert and oriented to person, place, and time.  Psychiatric:        Mood and Affect: Mood normal.        Thought Content: Thought content normal.      UC Treatments / Results  Labs (all labs ordered are listed, but only abnormal results are displayed) Labs Reviewed - No data to display  EKG   Radiology No results found.  Procedures Procedures (including critical care time)  Medications Ordered in UC Medications - No data to display  Initial Impression / Assessment and Plan / UC Course  I have reviewed the triage vital signs and the nursing notes.  Pertinent labs & imaging results that were available during my care of the patient were reviewed by me and considered in my medical decision making (see chart for details).     1.  Chest discomfort EKG done in office, reviewed by me and compared to previous from 01/09/2018: Normal sinus rhythm without ST elevation, QTc prolongation.  Waveforms stable in all leads.  Reviewed findings with patient in office: Chest discomfort likely multifactorial and include possible GERD, anxiety; lower concern for acute cardiopulmonary process at this time given patient's vitals, physical exam: Chest radiography deferred.  Patient to keep a symptom log, resume her lansoprazole and follow-up with PCP within the next week.  Return precautions discussed, patient verbalized understanding and is agreeable to plan.  Final Clinical Impressions(s) / UC Diagnoses   Final diagnoses:  Chest discomfort     Discharge Instructions     Follow up with PCP. Take prevacid daily. Keep symptom log as discussed: list symptoms, when they happen, what you're doing, what makes it better, BP, blood sugar if able & bring to PCP at next appointment.     ED Prescriptions    Medication Sig Dispense Auth. Provider   lansoprazole (PREVACID) 15 MG capsule Take 1 capsule (15 mg total) by mouth daily at 12 noon. 30 capsule Hall-Potvin, [place], PA-C     Controlled Substance Prescriptions [place] Controlled Substance Registry consulted? Not Applicable   Shea EvansHall-Potvin, Brittany, Cordelia Poche-C 09/22/18 1858

## 2018-09-22 NOTE — ED Triage Notes (Signed)
Patient presents to [facility] for assessment of SOB x 1 week, worse with exertion.  Also c/o a decrease in appetite and water consumption.  Hx of anxiety, unsure if that is causing it.  Also c/o left sided chest pressure, no wrapping all the way around to her left shoulder blade.  Denies pain, c/o pressure which worsen with anxiety.  Hx of HTN, but states today at work she was feeling like the room was spinning and her coworker took her BP and it was 88/60.  Patient states she took it prior to leaving work today and at 101/70,  136/87 at triage.  Patient c/o some upset stomach, denies N/V/D, denies diaphoresis, denies neck/jaw/arm pain

## 2018-09-22 NOTE — ED Notes (Signed)
Patient able to ambulate independently  

## 2018-09-22 NOTE — Discharge Instructions (Signed)
Follow up with PCP. Take prevacid daily. Keep symptom log as discussed: list symptoms, when they happen, what you're doing, what makes it better, BP, blood sugar if able & bring to PCP at next appointment.

## 2018-10-13 MED FILL — OLMSRTN-AMLDPN-HCTZ 40-5-25: 40-5-25 | 30 days supply | Qty: 30 | Fill #7

## 2018-11-09 ENCOUNTER — Encounter: Payer: Self-pay | Admitting: Family Medicine

## 2018-11-09 ENCOUNTER — Telehealth: Payer: Self-pay

## 2018-11-09 NOTE — Telephone Encounter (Signed)
Called and scheduled patient for Oct 27th hand pain.

## 2018-11-15 ENCOUNTER — Encounter: Payer: Self-pay | Admitting: Adult Health

## 2018-11-15 ENCOUNTER — Other Ambulatory Visit: Payer: Self-pay

## 2018-11-15 ENCOUNTER — Ambulatory Visit (INDEPENDENT_AMBULATORY_CARE_PROVIDER_SITE_OTHER): Payer: 59 | Admitting: Adult Health

## 2018-11-15 VITALS — BP 120/82 | Temp 97.3°F | Wt 196.0 lb

## 2018-11-15 DIAGNOSIS — G5602 Carpal tunnel syndrome, left upper limb: Secondary | ICD-10-CM | POA: Diagnosis not present

## 2018-11-15 DIAGNOSIS — R600 Localized edema: Secondary | ICD-10-CM

## 2018-11-15 MED FILL — OLMESARTAN-AMLODIPINE-HCTZ: 40-5-25 | 30 days supply | Qty: 30 | Fill #8

## 2018-11-15 NOTE — Progress Notes (Signed)
Subjective:    Patient ID: Valerie Gilmore, female    DOB: May 01, 1970, 48 y.o.   MRN: [id number]  HPI 48 year old female who  has a past medical history of Depression and Hypertension.  She presents to the office today for pain in her left wrist as well as concern for lower extremity edema.  In the past she has been seen by sports medicine for injections due to bilateral carpal tunnel syndrome.  She had 1 injection into the left, last being in April 2020.  She reports that she continues to have left carpal tunnel pain.  She does have an appointment with sports medicine in a couple of weeks.  She also complains of intermittent episodes of bilateral lower extremity edema that is worse on the right.  Happens after she works a long shift where she is continuously on her feet.  She does wear compression socks.  She does report that her diet is low in sodium but she does not drink a lot of water.  She denies chest pain or shortness of breath   Review of Systems See HPI   Past Medical History:  Diagnosis Date  . Depression   . Hypertension    high blood pressure readings     Social History   Socioeconomic History  . Marital status: Single    Spouse name: Not on file  . Number of children: Not on file  . Years of education: Not on file  . Highest education level: Not on file  Occupational History  . Not on file  Social Needs  . Financial resource strain: Not on file  . Food insecurity    Worry: Not on file    Inability: Not on file  . Transportation needs    Medical: Not on file    Non-medical: Not on file  Tobacco Use  . Smoking status: Never Smoker  . Smokeless tobacco: Never Used  Substance and Sexual Activity  . Alcohol use: No    Alcohol/week: 0.0 standard drinks  . Drug use: No  . Sexual activity: Not on file  Lifestyle  . Physical activity    Days per week: Not on file    Minutes per session: Not on file  . Stress: Not on file  Relationships  . Social Product manager on phone: Not on file    Gets together: Not on file    Attends religious service: Not on file    Active member of club or organization: Not on file    Attends meetings of clubs or organizations: Not on file    Relationship status: Not on file  . Intimate partner violence    Fear of current or ex partner: Not on file    Emotionally abused: Not on file    Physically abused: Not on file    Forced sexual activity: Not on file  Other Topics Concern  . Not on file  Social History Narrative  . Not on file    Past Surgical History:  Procedure Laterality Date  . ABDOMINAL HYSTERECTOMY  2011    Family History  Problem Relation Age of Onset  . Breast cancer Paternal Grandmother   . Prostate cancer Father   . Hypertension Other        both sides     Allergies  Allergen Reactions  . Sulfa Antibiotics Other (See Comments)    Hallucinations   . Other Rash    Powder gloves cause a  rash    Current Outpatient Medications on File Prior to Visit  Medication Sig Dispense Refill  . cetirizine (ZYRTEC) 10 MG tablet Take 1 tablet (10 mg total) by mouth daily. 30 tablet 11  . citalopram (CELEXA) 10 MG tablet Take 1 tablet (10 mg total) by mouth daily. 30 tablet 2  . fluticasone (FLONASE) 50 MCG/ACT nasal spray Place 2 sprays into both nostrils daily. 16 g 6  . lansoprazole (PREVACID) 15 MG capsule Take 1 capsule (15 mg total) by mouth daily at 12 noon. 30 capsule 0  . loratadine (CLARITIN) 10 MG tablet Take 1 tablet (10 mg total) by mouth daily. 30 tablet 11  . Olmesartan-amLODIPine-HCTZ 40-5-25 MG TABS Take 1 tablet by mouth daily. 90 tablet 3   No current facility-administered medications on file prior to visit.     BP 120/82   Temp (!) 97.3 F (36.3 C) (Temporal)   Wt 196 lb (88.9 kg)   BMI 29.80 kg/m       Objective:   Physical Exam Vitals signs and nursing note reviewed.  Constitutional:      Appearance: Normal appearance.  Cardiovascular:     Rate and Rhythm:  Normal rate and regular rhythm.     Pulses: Normal pulses.     Heart sounds: Normal heart sounds.  Pulmonary:     Effort: Pulmonary effort is normal.     Breath sounds: Normal breath sounds.  Abdominal:     General: Abdomen is flat.     Palpations: Abdomen is soft.  Musculoskeletal:        General: Tenderness (left wrist) present. No swelling, deformity or signs of injury.     Right lower leg: No edema.     Left lower leg: No edema.  Skin:    General: Skin is warm and dry.     Capillary Refill: Capillary refill takes less than 2 seconds.     Coloration: Skin is not jaundiced or pale.     Findings: No bruising, erythema, lesion or rash.  Neurological:     General: No focal deficit present.     Mental Status: She is alert and oriented to person, place, and time.  Psychiatric:        Mood and Affect: Mood normal.        Behavior: Behavior normal.        Thought Content: Thought content normal.        Judgment: Judgment normal.       Assessment & Plan:  1. Carpal tunnel syndrome of left wrist -We will defer to sports medicine as she has an appointment coming up for an injection  2. Lower extremity edema -No noticeable edema today in the office.  Advised to continue to wear compression socks while at work, eat a low-sodium diet and stay well-hydrated. -Follow-up as needed  Shirline Frees, NP

## 2018-11-16 ENCOUNTER — Ambulatory Visit: Payer: 59 | Admitting: Adult Health

## 2018-11-16 ENCOUNTER — Telehealth: Payer: Self-pay

## 2018-11-16 ENCOUNTER — Encounter: Payer: Self-pay | Admitting: Family Medicine

## 2018-11-16 NOTE — Telephone Encounter (Signed)
Patient returning call regarding opening for Dr Tamala Julian tomorrow (11/17/2018) at [place]. States that she is available to take this appointment. Would like a call back to confirm that it is still available so she can confirm with employer. Please advise.

## 2018-11-16 NOTE — Telephone Encounter (Signed)
Called patient to see if she would like to move her current appointment up. States she will call back with an answer.

## 2018-11-17 NOTE — Telephone Encounter (Signed)
Talked to patient. Still looking for a space to work her in.

## 2018-11-21 ENCOUNTER — Encounter: Payer: Self-pay | Admitting: Family Medicine

## 2018-11-23 ENCOUNTER — Telehealth: Payer: Self-pay

## 2018-11-23 NOTE — Telephone Encounter (Signed)
Changed patient appointment to Monday.

## 2018-11-26 NOTE — Progress Notes (Signed)
[name] [address] [place], [place] [address] Phone: [phone] Subjective:   Valerie Gilmore, am serving as a scribe for Dr. Antoine Primas.  I'm seeing this patient by the request  of:    CC: Wrist pain follow-up  MBW:[id number]   7/7/20320 Repeat injection given today.  Discussed with the bracing.  Patient does not want to do the gabapentin regularly.  We discussed icing regimen and home exercise.  Follow-up again in 4 to 8 weeks  Update 12/07/2018 Valerie Gilmore is a 48 y.o. female coming in with complaint of left wrist pain. Patient states that for the past 2 weeks she has had an increase in her pain. Numbness and tingling in all fingers but the thumb.   Last injection was in July 2020 on the right side  Past Medical History:  Diagnosis Date  . Depression   . Hypertension    high blood pressure readings    Past Surgical History:  Procedure Laterality Date  . ABDOMINAL HYSTERECTOMY  2011   Social History   Socioeconomic History  . Marital status: Single    Spouse name: Not on file  . Number of children: Not on file  . Years of education: Not on file  . Highest education level: Not on file  Occupational History  . Not on file  Social Needs  . Financial resource strain: Not on file  . Food insecurity    Worry: Not on file    Inability: Not on file  . Transportation needs    Medical: Not on file    Non-medical: Not on file  Tobacco Use  . Smoking status: Never Smoker  . Smokeless tobacco: Never Used  Substance and Sexual Activity  . Alcohol use: No    Alcohol/week: 0.0 standard drinks  . Drug use: No  . Sexual activity: Not on file  Lifestyle  . Physical activity    Days per week: Not on file    Minutes per session: Not on file  . Stress: Not on file  Relationships  . Social Musician on phone: Not on file    Gets together: Not on file    Attends religious service: Not on file    Active member of club or  organization: Not on file    Attends meetings of clubs or organizations: Not on file    Relationship status: Not on file  Other Topics Concern  . Not on file  Social History Narrative  . Not on file   Allergies  Allergen Reactions  . Sulfa Antibiotics Other (See Comments)    Hallucinations   . Other Rash    Powder gloves cause a rash   Family History  Problem Relation Age of Onset  . Breast cancer Paternal Grandmother   . Prostate cancer Father   . Hypertension Other        both sides      Current Outpatient Medications (Cardiovascular):  Marland Kitchen  Olmesartan-amLODIPine-HCTZ 40-5-25 MG TABS, Take 1 tablet by mouth daily.  Current Outpatient Medications (Respiratory):  .  cetirizine (ZYRTEC) 10 MG tablet, Take 1 tablet (10 mg total) by mouth daily. .  fluticasone (FLONASE) 50 MCG/ACT nasal spray, Place 2 sprays into both nostrils daily. Marland Kitchen  loratadine (CLARITIN) 10 MG tablet, Take 1 tablet (10 mg total) by mouth daily.    Current Outpatient Medications (Other):  .  citalopram (CELEXA) 10 MG tablet, Take 1 tablet (10 mg total)  by mouth daily. .  lansoprazole (PREVACID) 15 MG capsule, Take 1 capsule (15 mg total) by mouth daily at 12 noon.    Past medical history, social, surgical and family history all reviewed in electronic medical record.  No pertanent information unless stated regarding to the chief complaint.   Review of Systems:  No headache, visual changes, nausea, vomiting, diarrhea, constipation, dizziness, abdominal pain, skin rash, fevers, chills, night sweats, weight loss, swollen lymph nodes, body aches, joint swelling, muscle aches, chest pain, shortness of breath, mood changes.   Objective  There were no vitals taken for this visit. Systems examined below as of    General: No apparent distress alert and oriented x3 mood and affect normal, dressed appropriately.  HEENT: Pupils equal, extraocular movements intact  Respiratory: Patient's speak in full sentences  and does not appear short of breath  Cardiovascular: No lower extremity edema, non tender, no erythema  Skin: Warm dry intact with no signs of infection or rash on extremities or on axial skeleton.  Abdomen: Soft nontender  Neuro: Cranial nerves II through XII are intact, neurovascularly intact in all extremities with 2+ DTRs and 2+ pulses.  Lymph: No lymphadenopathy of posterior or anterior cervical chain or axillae bilaterally.  Gait normal with good balance and coordination.  MSK:  Non tender with full range of motion and good stability and symmetric strength and tone of shoulders, elbows, , hip, knee and ankles bilaterally.  Wrist: Left Inspection normal with no visible erythema or swelling. ROM smooth and normal with good flexion and extension and ulnar/radial deviation that is symmetrical with opposite wrist. Palpation is normal over metacarpals, navicular, lunate, and TFCC; tendons without tenderness/ swelling No snuffbox tenderness. No tenderness over Canal of [place]. Strength 5/5 in all directions without pain. Negative Finkelstein, positive tinel's and phalens. Negative Watson's test. Contralateral wrist still has a mild positive Tinel's but improved from previous exam.  Procedure: Real-time Ultrasound Guided Injection of left carpal tunnel Device: GE Logiq Q7 Ultrasound guided injection is preferred based studies that show increased duration, increased effect, greater accuracy, decreased procedural pain, increased response rate with ultrasound guided versus blind injection.  Verbal informed consent obtained.  Time-out conducted.  Noted no overlying erythema, induration, or other signs of local infection.  Skin prepped in a sterile fashion.  Local anesthesia: Topical Ethyl chloride.  With sterile technique and under real time ultrasound guidance:  median nerve visualized.  23g 5/8 inch needle inserted distal to proximal approach into nerve sheath. Pictures taken nfor needle  placement. Patient did have injection of 2 cc of 0.5% Marcaine, and 1 cc of Kenalog 40 mg/dL. Completed without difficulty  Pain immediately resolved suggesting accurate placement of the medication.  Advised to call if fevers/chills, erythema, induration, drainage, or persistent bleeding.  Images permanently stored and available for review in the ultrasound unit.  Impression: Technically successful ultrasound guided injection.   Impression and Recommendations:     This case required medical decision making of moderate complexity. The above documentation has been reviewed and is accurate and complete Lyndal Pulley, DO       Note: This dictation was prepared with Dragon dictation along with smaller phrase technology. Any transcriptional errors that result from this process are unintentional.

## 2018-11-27 ENCOUNTER — Other Ambulatory Visit: Payer: Self-pay

## 2018-11-27 ENCOUNTER — Encounter: Payer: Self-pay | Admitting: Family Medicine

## 2018-11-27 ENCOUNTER — Ambulatory Visit: Payer: Self-pay

## 2018-11-27 ENCOUNTER — Ambulatory Visit (INDEPENDENT_AMBULATORY_CARE_PROVIDER_SITE_OTHER): Payer: 59 | Admitting: Family Medicine

## 2018-11-27 VITALS — BP 132/94 | HR 85 | Ht 68.0 in | Wt 196.0 lb

## 2018-11-27 DIAGNOSIS — M25531 Pain in right wrist: Secondary | ICD-10-CM

## 2018-11-27 DIAGNOSIS — G5602 Carpal tunnel syndrome, left upper limb: Secondary | ICD-10-CM

## 2018-11-27 NOTE — Assessment & Plan Note (Signed)
Repeat injection given today.  Tolerated the procedure well.  Patient had near complete resolution of pain almost immediately.  Patient wants to continue to try other conservative therapy including injections and would like to avoid surgical intervention if possible.  Discussed the bracing at night.  Follow-up with me again in 4 to 8 weeks

## 2018-11-27 NOTE — Patient Instructions (Signed)
Injected left side  See me in 6-8 weeks

## 2018-11-28 ENCOUNTER — Ambulatory Visit
Admission: EM | Admit: 2018-11-28 | Discharge: 2018-11-28 | Disposition: A | Discharge status: Home / Self Care | Payer: 59 | Attending: Emergency Medicine | Admitting: Emergency Medicine

## 2018-11-28 ENCOUNTER — Other Ambulatory Visit: Payer: Self-pay

## 2018-11-28 DIAGNOSIS — R05 Cough: Secondary | ICD-10-CM

## 2018-11-28 DIAGNOSIS — R509 Fever, unspecified: Secondary | ICD-10-CM

## 2018-11-28 DIAGNOSIS — Z1159 Encounter for screening for other viral diseases: Secondary | ICD-10-CM | POA: Diagnosis not present

## 2018-11-28 DIAGNOSIS — R519 Headache, unspecified: Secondary | ICD-10-CM

## 2018-11-28 DIAGNOSIS — R059 Cough, unspecified: Secondary | ICD-10-CM

## 2018-11-28 MED ORDER — KETOROLAC TROMETHAMINE 60 MG/2ML IM SOLN
60.0000 mg | Freq: Once | INTRAMUSCULAR | Status: AC
  Administered 2018-11-28: 60 mg via INTRAMUSCULAR

## 2018-11-28 MED ORDER — DEXAMETHASONE SODIUM PHOSPHATE 10 MG/ML IJ SOLN
10.0000 mg | Freq: Once | INTRAMUSCULAR | Status: AC
  Administered 2018-11-28: 10 mg via INTRAMUSCULAR

## 2018-11-28 MED ORDER — ONDANSETRON 4 MG PO TBDP
4.0000 mg | ORAL_TABLET | Freq: Once | ORAL | Status: AC
  Administered 2018-11-28: 4 mg via ORAL

## 2018-11-28 MED ORDER — ONDANSETRON 4 MG PO TBDP: 4.0000 mg | ORAL_TABLET | Freq: Three times a day (TID) | ORAL | 0 refills | Status: DC | PRN

## 2018-11-28 MED FILL — ONDANSETRON ODT 4 MG TABLET: 4 | 7 days supply | Qty: 21 | Fill #0

## 2018-11-28 NOTE — ED Provider Notes (Signed)
[name]    CSN: [id number] Arrival date & time: 11/28/18  0930      History   Chief Complaint Chief Complaint  Patient presents with  . Generalized Body Aches    HPI Valerie Gilmore is a 48 y.o. female with history of hypertension, depression presenting for body aches, frontal headache, mild nonproductive cough, subjective fever since Sunday.  Patient states symptom onset was sudden.  Patient denies known sick exposures, shortness of breath.  Patient denies worse headache of life, thunderclap headache.  Patient states she tends to get headaches in the same location, though this headache was not alleviated by Tylenol as it usually is.  Patient denies change in vision or hearing, nausea, photophobia, phonophobia.   Past Medical History:  Diagnosis Date  . Depression   . Hypertension    high blood pressure readings     Patient Active Problem List   Diagnosis Date Noted  . Carpal tunnel syndrome 04/13/2018  . Degenerative disc disease at L5-S1 level 01/24/2018  . Generalized anxiety disorder 02/24/2017  . Essential hypertension 12/12/2013    Past Surgical History:  Procedure Laterality Date  . ABDOMINAL HYSTERECTOMY  2011    OB History   No obstetric history on file.      Home Medications    Prior to Admission medications   Medication Sig Start Date End Date Taking? Authorizing Provider  cetirizine (ZYRTEC) 10 MG tablet Take 1 tablet (10 mg total) by mouth daily. 05/27/17   Wallis Bamberg, PA-C  citalopram (CELEXA) 10 MG tablet Take 1 tablet (10 mg total) by mouth daily. 01/10/18   Nafziger, Kandee Keen, NP  fluticasone (FLONASE) 50 MCG/ACT nasal spray Place 2 sprays into both nostrils daily. 05/27/17   Wallis Bamberg, PA-C  lansoprazole (PREVACID) 15 MG capsule Take 1 capsule (15 mg total) by mouth daily at 12 noon. 09/22/18   Hall-Potvin, [place], PA-C  loratadine (CLARITIN) 10 MG tablet Take 1 tablet (10 mg total) by mouth daily. 05/27/17   Wallis Bamberg, PA-C   Olmesartan-amLODIPine-HCTZ 40-5-25 MG TABS Take 1 tablet by mouth daily. 01/17/18   Nafziger, Kandee Keen, NP  ondansetron (ZOFRAN ODT) 4 MG disintegrating tablet Take 1 tablet (4 mg total) by mouth every 8 (eight) hours as needed for nausea or vomiting. 11/28/18   Hall-Potvin, [place], PA-C    Family History Family History  Problem Relation Age of Onset  . Breast cancer Paternal Grandmother   . Prostate cancer Father   . Hypertension Other        both sides     Social History Social History   Tobacco Use  . Smoking status: Never Smoker  . Smokeless tobacco: Never Used  Substance Use Topics  . Alcohol use: No    Alcohol/week: 0.0 standard drinks  . Drug use: No     Allergies   Sulfa antibiotics and Other   Review of Systems Review of Systems  Constitutional: Negative for activity change, appetite change and fatigue.       Positive for subjective fever: Temperature in office 99.68F  HENT: Negative for congestion, dental problem, ear pain, facial swelling, hearing loss, sinus pain, sore throat, trouble swallowing and voice change.   Eyes: Negative for photophobia, pain and visual disturbance.  Respiratory: Positive for cough. Negative for shortness of breath and wheezing.   Cardiovascular: Negative for chest pain and palpitations.  Gastrointestinal: Negative for abdominal pain, diarrhea, nausea and vomiting.  Musculoskeletal: Positive for myalgias. Negative for arthralgias and gait problem.  Neurological:  Positive for headaches. Negative for dizziness, tremors, syncope, facial asymmetry, speech difficulty, weakness, light-headedness and numbness.     Physical Exam Triage Vital Signs ED Triage Vitals  Enc Vitals Group     BP      Pulse      Resp      Temp      Temp src      SpO2      Weight      Height      Head Circumference      Peak Flow      Pain Score      Pain Loc      Pain Edu?      Excl. in GC?    No data found.  Updated Vital Signs BP 132/86 (BP  Location: Left Arm)   Pulse 77   Temp 99.8 F (37.7 C) (Oral)   Resp 18   SpO2 96%   Visual Acuity Right Eye Distance:   Left Eye Distance:   Bilateral Distance:    Right Eye Near:   Left Eye Near:    Bilateral Near:     Physical Exam Constitutional:      General: She is not in acute distress.    Appearance: She is not toxic-appearing.  HENT:     Head: Normocephalic and atraumatic.     Right Ear: Tympanic membrane, ear canal and external ear normal.     Left Ear: Tympanic membrane, ear canal and external ear normal.     Nose: Nose normal.     Mouth/Throat:     Mouth: Mucous membranes are moist.     Pharynx: Oropharynx is clear.  Eyes:     General: No scleral icterus.    Pupils: Pupils are equal, round, and reactive to light.  Neck:     Musculoskeletal: Normal range of motion. No muscular tenderness.  Cardiovascular:     Rate and Rhythm: Normal rate and regular rhythm.     Heart sounds: No murmur. No gallop.   Pulmonary:     Effort: Pulmonary effort is normal. No respiratory distress.     Breath sounds: No wheezing.  Musculoskeletal: Normal range of motion.        General: No swelling or tenderness.     Right lower leg: No edema.     Left lower leg: No edema.  Lymphadenopathy:     Cervical: No cervical adenopathy.  Skin:    Capillary Refill: Capillary refill takes less than 2 seconds.     Coloration: Skin is not jaundiced or pale.     Findings: No rash.  Neurological:     General: No focal deficit present.     Mental Status: She is alert and oriented to person, place, and time.     Cranial Nerves: No cranial nerve deficit.     Sensory: No sensory deficit.     Motor: No weakness.     Coordination: Coordination normal.     Gait: Gait normal.     Deep Tendon Reflexes: Reflexes normal.      UC Treatments / Results  Labs (all labs ordered are listed, but only abnormal results are displayed) Labs Reviewed  NOVEL CORONAVIRUS, NAA    EKG   Radiology No  results found.  Procedures Procedures (including critical care time)  Medications Ordered in UC Medications  ketorolac (TORADOL) injection 60 mg (60 mg Intramuscular Given 11/28/18 1113)  dexamethasone (DECADRON) injection 10 mg (10 mg Intramuscular Given 11/28/18 1113)  ondansetron (  ZOFRAN-ODT) disintegrating tablet 4 mg (4 mg Oral Given 11/28/18 1113)    Initial Impression / Assessment and Plan / UC Course  I have reviewed the triage vital signs and the nursing notes.  Pertinent labs & imaging results that were available during my care of the patient were reviewed by me and considered in my medical decision making (see chart for details).     No neurocognitive deficits on exam: Patient given headache cocktail as outlined above which she tolerated well and reported improvement in headache at time of discharge.  Covid test pending: Patient to quarantine until test results are back-return to work pending results/symptoms.  Patient declines prescription for cough medication stating conservative measures at home are helping.  Low concern for rhabdo given mild myalgias without exquisite tenderness on exam or uremic symptoms.  Return precautions discussed, patient verbalized understanding and is agreeable to plan. Final Clinical Impressions(s) / UC Diagnoses   Final diagnoses:  Cough  Fever, unspecified fever cause  Acute intractable headache, unspecified headache type     Discharge Instructions     Your COVID test is pending: Is important you quarantine at home until your results are back. You may take OTC Tylenol for fever and myalgias.  It is important to drink plenty of water throughout the day to stay hydrated. If you test positive and later develop severe fever, cough, or shortness of breath, it is recommended that you go to the ER for further evaluation.    ED Prescriptions    Medication Sig Dispense Auth. Provider   ondansetron (ZOFRAN ODT) 4 MG disintegrating tablet Take 1  tablet (4 mg total) by mouth every 8 (eight) hours as needed for nausea or vomiting. 21 tablet Hall-Potvin, [place], PA-C     PDMP not reviewed this encounter.   Neldon Mc [place], [place] 11/29/18 [phone]

## 2018-11-28 NOTE — ED Triage Notes (Signed)
Pt c/o body aches, headache and fever since sunday

## 2018-11-28 NOTE — Discharge Instructions (Addendum)
Your COVID test is pending: Is important you quarantine at home until your results are back. °You may take OTC Tylenol for fever and myalgias.  It is important to drink plenty of water throughout the day to stay hydrated. °If you test positive and later develop severe fever, cough, or shortness of breath, it is recommended that you go to the ER for further evaluation. °

## 2018-11-30 LAB — NOVEL CORONAVIRUS, NAA: SARS-CoV-2, NAA: NOT DETECTED

## 2018-12-05 ENCOUNTER — Ambulatory Visit: Payer: 59 | Admitting: Family Medicine

## 2018-12-29 MED FILL — OLMESARTAN-AMLODIPINE-HCTZ: 40-5-25 | 30 days supply | Qty: 30 | Fill #9

## 2019-01-11 ENCOUNTER — Other Ambulatory Visit: Payer: Self-pay

## 2019-01-11 ENCOUNTER — Encounter: Payer: Self-pay | Admitting: Family Medicine

## 2019-01-11 DIAGNOSIS — Z20822 Contact with and (suspected) exposure to covid-19: Secondary | ICD-10-CM

## 2019-01-11 MED FILL — IBUPROFEN 800 MG TAB: 800 | 30 days supply | Qty: 90 | Fill #0

## 2019-01-15 LAB — NOVEL CORONAVIRUS, NAA: SARS-CoV-2, NAA: NOT DETECTED

## 2019-01-17 ENCOUNTER — Other Ambulatory Visit: Payer: Self-pay

## 2019-01-17 DIAGNOSIS — Z20822 Contact with and (suspected) exposure to covid-19: Secondary | ICD-10-CM

## 2019-01-19 LAB — NOVEL CORONAVIRUS, NAA: SARS-CoV-2, NAA: NOT DETECTED

## 2019-01-22 ENCOUNTER — Ambulatory Visit (INDEPENDENT_AMBULATORY_CARE_PROVIDER_SITE_OTHER): Payer: 59 | Admitting: Family Medicine

## 2019-01-22 ENCOUNTER — Other Ambulatory Visit: Payer: Self-pay

## 2019-01-22 ENCOUNTER — Encounter: Payer: Self-pay | Admitting: Family Medicine

## 2019-01-22 ENCOUNTER — Ambulatory Visit: Payer: Self-pay

## 2019-01-22 VITALS — BP 122/84 | HR 68 | Ht 68.0 in | Wt 191.0 lb

## 2019-01-22 DIAGNOSIS — G5601 Carpal tunnel syndrome, right upper limb: Secondary | ICD-10-CM | POA: Diagnosis not present

## 2019-01-22 DIAGNOSIS — M25531 Pain in right wrist: Secondary | ICD-10-CM | POA: Diagnosis not present

## 2019-01-22 MED ORDER — IBUPROFEN 800 MG PO TABS: 800.0000 mg | ORAL_TABLET | Freq: Three times a day (TID) | ORAL | 0 refills | Status: DC | PRN

## 2019-01-22 NOTE — Progress Notes (Signed)
[facility] [name], [place] [address] Phone: [phone] Subjective:    This visit occurred during the SARS-CoV-2 public health emergency.  Safety protocols were in place, including screening questions prior to the visit, additional usage of staff PPE, and extensive cleaning of exam room while observing appropriate contact time as indicated for disinfecting solutions.   CC: Hand pain follow-up  DHR:[id number]   11/27/2018 Repeat injection given today.  Tolerated the procedure well.  Patient had near complete resolution of pain almost immediately.  Patient wants to continue to try other conservative therapy including injections and would like to avoid surgical intervention if possible.  Discussed the bracing at night.  Follow-up with me again in 4 to 8 weeks  Update 01/22/2019  Valerie Gilmore is a 48 y.o. female coming in with complaint of wrist pain. Patient states that she is having numbness and tingling in wrist and hand.  Patient has had carpal tunnel previously.  Last injection was in July.  Starting to have increasing discomfort and pain again.  Patient states that he is down to 5 daily activities and working out at night.    Past Medical History:  Diagnosis Date  . Depression   . Hypertension    high blood pressure readings    Past Surgical History:  Procedure Laterality Date  . ABDOMINAL HYSTERECTOMY  2011   Social History   Socioeconomic History  . Marital status: Single    Spouse name: Not on file  . Number of children: Not on file  . Years of education: Not on file  . Highest education level: Not on file  Occupational History  . Not on file  Tobacco Use  . Smoking status: Never Smoker  . Smokeless tobacco: Never Used  Substance and Sexual Activity  . Alcohol use: No    Alcohol/week: 0.0 standard drinks  . Drug use: No  . Sexual activity: Not on file  Other Topics Concern  . Not on file  Social History Narrative  .  Not on file   Social Determinants of Health   Financial Resource Strain:   . Difficulty of Paying Living Expenses: Not on file  Food Insecurity:   . Worried About Charity fundraiser in the Last Year: Not on file  . Ran Out of Food in the Last Year: Not on file  Transportation Needs:   . Lack of Transportation (Medical): Not on file  . Lack of Transportation (Non-Medical): Not on file  Physical Activity:   . Days of Exercise per Week: Not on file  . Minutes of Exercise per Session: Not on file  Stress:   . Feeling of Stress : Not on file  Social Connections:   . Frequency of Communication with Friends and Family: Not on file  . Frequency of Social Gatherings with Friends and Family: Not on file  . Attends Religious Services: Not on file  . Active Member of Clubs or Organizations: Not on file  . Attends Archivist Meetings: Not on file  . Marital Status: Not on file   Allergies  Allergen Reactions  . Sulfa Antibiotics Other (See Comments)    Hallucinations   . Other Rash    Powder gloves cause a rash   Family History  Problem Relation Age of Onset  . Breast cancer Paternal Grandmother   . Prostate cancer Father   . Hypertension Other        both sides  Current Outpatient Medications (Cardiovascular):  Marland Kitchen.  Olmesartan-amLODIPine-HCTZ 40-5-25 MG TABS, Take 1 tablet by mouth daily.  Current Outpatient Medications (Respiratory):  .  cetirizine (ZYRTEC) 10 MG tablet, Take 1 tablet (10 mg total) by mouth daily. .  fluticasone (FLONASE) 50 MCG/ACT nasal spray, Place 2 sprays into both nostrils daily. Marland Kitchen.  loratadine (CLARITIN) 10 MG tablet, Take 1 tablet (10 mg total) by mouth daily.  Current Outpatient Medications (Analgesics):  .  ibuprofen (IBU) 800 MG tablet, Take 1 tablet (800 mg total) by mouth every 8 (eight) hours as needed.   Current Outpatient Medications (Other):  .  citalopram (CELEXA) 10 MG tablet, Take 1 tablet (10 mg total) by mouth daily. .   lansoprazole (PREVACID) 15 MG capsule, Take 1 capsule (15 mg total) by mouth daily at 12 noon. .  ondansetron (ZOFRAN ODT) 4 MG disintegrating tablet, Take 1 tablet (4 mg total) by mouth every 8 (eight) hours as needed for nausea or vomiting.    Past medical history, social, surgical and family history all reviewed in electronic medical record.  No pertanent information unless stated regarding to the chief complaint.   Review of Systems:  No headache, visual changes, nausea, vomiting, diarrhea, constipation, dizziness, abdominal pain, skin rash, fevers, chills, night sweats, weight loss, swollen lymph nodes, body aches, joint swelling, muscle aches, chest pain, shortness of breath, mood changes.   Objective  Blood pressure 122/84, pulse 68, height 5\' 8"  (1.727 m), weight 191 lb (86.6 kg), SpO2 99 %.    General: No apparent distress alert and oriented x3 mood and affect normal, dressed appropriately.  HEENT: Pupils equal, extraocular movements intact  Respiratory: Patient's speak in full sentences and does not appear short of breath  Cardiovascular: No lower extremity edema, non tender, no erythema  Skin: Warm dry intact with no signs of infection or rash on extremities or on axial skeleton.  Abdomen: Soft nontender  Neuro: Cranial nerves II through XII are intact, neurovascularly intact in all extremities with 2+ DTRs and 2+ pulses.  Lymph: No lymphadenopathy of posterior or anterior cervical chain or axillae bilaterally.  Gait normal with good balance and coordination.  MSK:  Non tender with full range of motion and good stability and symmetric strength and tone of shoulders, elbows, hip, knee and ankles bilaterally.  Wrist exam shows the patient has no significant arthritic changes.  Positive Tinel's.  Mild weakness in grip on the right compared to contralateral side.  Full range of motion of the wrist though noted.  Procedure: Real-time Ultrasound Guided Injection of right carpal  tunnel Device: GE Logiq Q7  Ultrasound guided injection is preferred based studies that show increased duration, increased effect, greater accuracy, decreased procedural pain, increased response rate with ultrasound guided versus blind injection.  Verbal informed consent obtained.  Time-out conducted.  Noted no overlying erythema, induration, or other signs of local infection.  Skin prepped in a sterile fashion.  Local anesthesia: Topical Ethyl chloride.  With sterile technique and under real time ultrasound guidance:  median nerve visualized.  23g 5/8 inch needle inserted distal to proximal approach into nerve sheath. Pictures taken nfor needle placement. Patient did have injection of 2 cc of 1% lidocaine, 1 cc of 0.5% Marcaine, and 1 cc of Kenalog 40 mg/dL. Completed without difficulty  Pain immediately resolved suggesting accurate placement of the medication.  Advised to call if fevers/chills, erythema, induration, drainage, or persistent bleeding.  Images permanently stored and available for review in the ultrasound unit.  Impression:  Technically successful ultrasound guided injection.    Impression and Recommendations:     This case required medical decision making of moderate complexity. The above documentation has been reviewed and is accurate and complete Judi Saa, DO       Note: This dictation was prepared with Dragon dictation along with smaller phrase technology. Any transcriptional errors that result from this process are unintentional.

## 2019-01-22 NOTE — Patient Instructions (Addendum)
  [address]st [address] [place], [place] [address] Phone [phone]  Injected right wrist today

## 2019-01-22 NOTE — Assessment & Plan Note (Signed)
Patient responded well to the injection and had near complete resolution of pain almost immediately after the injection.  Patient does have significant dilation of the median nerve and will need to consider the possibility of a surgical intervention at some point.  Patient at this point would like to avoid it.  Patient will increase activity slowly over the course the next several weeks.  Follow-up with me again in 4 to 8 weeks

## 2019-01-26 MED FILL — IBUPROFEN 800 MG TAB: 800 | 30 days supply | Qty: 90 | Fill #0

## 2019-02-05 ENCOUNTER — Other Ambulatory Visit: Payer: Self-pay | Admitting: Adult Health

## 2019-02-05 ENCOUNTER — Encounter: Payer: Self-pay | Admitting: Adult Health

## 2019-02-05 MED FILL — OLMSRTN-AMLDPN-HCTZ 40-5-25: 40-5-25 | 30 days supply | Qty: 30 | Fill #0

## 2019-02-13 DIAGNOSIS — Z20828 Contact with and (suspected) exposure to other viral communicable diseases: Secondary | ICD-10-CM | POA: Diagnosis not present

## 2019-02-13 DIAGNOSIS — Z03818 Encounter for observation for suspected exposure to other biological agents ruled out: Secondary | ICD-10-CM | POA: Diagnosis not present

## 2019-02-15 ENCOUNTER — Ambulatory Visit (INDEPENDENT_AMBULATORY_CARE_PROVIDER_SITE_OTHER): Payer: 59 | Admitting: Adult Health

## 2019-02-15 ENCOUNTER — Encounter: Payer: Self-pay | Admitting: Adult Health

## 2019-02-15 ENCOUNTER — Other Ambulatory Visit: Payer: Self-pay

## 2019-02-15 VITALS — BP 110/80 | HR 55 | Temp 97.9°F | Ht 67.0 in | Wt 186.0 lb

## 2019-02-15 DIAGNOSIS — F419 Anxiety disorder, unspecified: Secondary | ICD-10-CM | POA: Diagnosis not present

## 2019-02-15 DIAGNOSIS — Z0000 Encounter for general adult medical examination without abnormal findings: Secondary | ICD-10-CM

## 2019-02-15 DIAGNOSIS — G43111 Migraine with aura, intractable, with status migrainosus: Secondary | ICD-10-CM | POA: Diagnosis not present

## 2019-02-15 DIAGNOSIS — I10 Essential (primary) hypertension: Secondary | ICD-10-CM

## 2019-02-15 DIAGNOSIS — E7439 Other disorders of intestinal carbohydrate absorption: Secondary | ICD-10-CM | POA: Diagnosis not present

## 2019-02-15 DIAGNOSIS — K219 Gastro-esophageal reflux disease without esophagitis: Secondary | ICD-10-CM | POA: Diagnosis not present

## 2019-02-15 DIAGNOSIS — F32A Depression, unspecified: Secondary | ICD-10-CM | POA: Insufficient documentation

## 2019-02-15 DIAGNOSIS — F329 Major depressive disorder, single episode, unspecified: Secondary | ICD-10-CM | POA: Diagnosis not present

## 2019-02-15 LAB — LIPID PANEL
Cholesterol: 210 mg/dL — ABNORMAL HIGH (ref 0–200)
HDL: 46.5 mg/dL (ref >39.00)
LDL Cholesterol: 140 mg/dL — ABNORMAL HIGH (ref 0–99)
NonHDL: 163.68
Total CHOL/HDL Ratio: 5
Triglycerides: 116 mg/dL (ref 0.0–149.0)
VLDL: 23.2 mg/dL (ref 0.0–40.0)

## 2019-02-15 LAB — CBC WITH DIFFERENTIAL/PLATELET
Basophils Absolute: 0 10*3/uL (ref 0.0–0.1)
Basophils Relative: 0.6 % (ref 0.0–3.0)
Eosinophils Absolute: 0.1 10*3/uL (ref 0.0–0.7)
Eosinophils Relative: 1.6 % (ref 0.0–5.0)
HCT: 42 % (ref 36.0–46.0)
Hemoglobin: 13.9 g/dL (ref 12.0–15.0)
Lymphocytes Relative: 46 % (ref 12.0–46.0)
Lymphs Abs: 1.6 10*3/uL (ref 0.7–4.0)
MCHC: 33.1 g/dL (ref 30.0–36.0)
MCV: 84 fl (ref 78.0–100.0)
Monocytes Absolute: 0.4 10*3/uL (ref 0.1–1.0)
Monocytes Relative: 9.9 % (ref 3.0–12.0)
Neutro Abs: 1.5 10*3/uL (ref 1.4–7.7)
Neutrophils Relative %: 41.9 % — ABNORMAL LOW (ref 43.0–77.0)
Platelets: 327 10*3/uL (ref 150.0–400.0)
RBC: 5 Mil/uL (ref 3.87–5.11)
RDW: 13.9 % (ref 11.5–15.5)
WBC: 3.5 10*3/uL — ABNORMAL LOW (ref 4.0–10.5)

## 2019-02-15 LAB — HEMOGLOBIN A1C: Hgb A1c MFr Bld: 6.3 % (ref 4.6–6.5)

## 2019-02-15 LAB — COMPREHENSIVE METABOLIC PANEL
ALT: 23 U/L (ref 0–35)
AST: 17 U/L (ref 0–37)
Albumin: 4.9 g/dL (ref 3.5–5.2)
Alkaline Phosphatase: 36 U/L — ABNORMAL LOW (ref 39–117)
BUN: 13 mg/dL (ref 6–23)
CO2: 29 mEq/L (ref 19–32)
Calcium: 10.5 mg/dL (ref 8.4–10.5)
Chloride: 100 mEq/L (ref 96–112)
Creatinine, Ser: 0.85 mg/dL (ref 0.40–1.20)
GFR: 87.09 mL/min (ref >60.00)
Glucose, Bld: 122 mg/dL — ABNORMAL HIGH (ref 70–99)
Potassium: 3.5 mEq/L (ref 3.5–5.1)
Sodium: 139 mEq/L (ref 135–145)
Total Bilirubin: 0.4 mg/dL (ref 0.2–1.2)
Total Protein: 8 g/dL (ref 6.0–8.3)

## 2019-02-15 LAB — TSH: TSH: 1.67 u[IU]/mL (ref 0.35–4.50)

## 2019-02-15 MED ORDER — CITALOPRAM HYDROBROMIDE 10 MG PO TABS: 10.0000 mg | ORAL_TABLET | Freq: Every day | ORAL | 1 refills | Status: DC

## 2019-02-15 MED ORDER — OLMESARTAN-AMLODIPINE-HCTZ 40-5-25 MG PO TABS: 1.0000 | ORAL_TABLET | Freq: Every day | ORAL | 3 refills | Status: DC

## 2019-02-15 MED ORDER — PANTOPRAZOLE SODIUM 40 MG PO TBEC: 40.0000 mg | DELAYED_RELEASE_TABLET | Freq: Every day | ORAL | 3 refills | Status: DC

## 2019-02-15 MED FILL — PANTOPRAZOLE SOD DR 40 MG T: 40 | 90 days supply | Qty: 90 | Fill #0

## 2019-02-15 MED FILL — CITALOPRAM HBR 10 MG TABLET: 10 | 90 days supply | Qty: 90 | Fill #0

## 2019-02-15 NOTE — Progress Notes (Signed)
Subjective:    Patient ID: Valerie Gilmore, female    DOB: 26-Dec-1970, 49 y.o.   MRN: [id number]  HPI Patient presents for yearly preventative medicine examination. She is a pleasant 49 year old female who  has a past medical history of Depression, Hypertension, and Migraine.  Migraine Headache- infrequent these days. Takes OTC medication and feels well controlled when she needs to take medication.   Hypertension -controlled with Tribenzor.  She denies dizziness, lightheadedness, chest pain, or shortness of breath. BP Readings from Last 3 Encounters:  02/15/19 110/80  01/22/19 122/84  11/28/18 132/86   Depression/Anxiety - controlled with Celexa. Needs refill.   GERD - takes Prevaid daily but does not feel as though this is working well any longer. She has a lot of gas and acid reflux at times.   Carpal Tunnel - recently was seen by Dr. Katrinka Blazing and had steroid injections. She is currently pain free  All immunizations and health maintenance protocols were reviewed with the patient and needed orders were placed. She is up to date on routine vaccinations   Appropriate screening laboratory values were ordered for the patient including screening of hyperlipidemia, renal function and hepatic function.  Medication reconciliation,  past medical history, social history, problem list and allergies were reviewed in detail with the patient  Goals were established with regard to weight loss, exercise, and  diet in compliance with medications. She eats healthy and stays active at work   [employer] Readings from Last 3 Encounters:  02/15/19 186 lb (84.4 kg)  01/22/19 191 lb (86.6 kg)  11/27/18 196 lb (88.9 kg)     She has history of hysterectomy and no longer needs a Pap.  She is due for mammogram, order has been placed she has not scheduled yet    Review of Systems  Constitutional: Negative.   HENT: Negative.   Eyes: Negative.   Respiratory: Negative.   Cardiovascular: Negative.     Gastrointestinal: Negative.   Endocrine: Negative.   Genitourinary: Negative.   Musculoskeletal: Positive for arthralgias.  Skin: Negative.   Allergic/Immunologic: Negative.   Neurological: Negative.   Hematological: Negative.   Psychiatric/Behavioral: Negative.    Past Medical History:  Diagnosis Date  . Depression   . Hypertension    high blood pressure readings   . Migraine     Social History   Socioeconomic History  . Marital status: Single    Spouse name: Not on file  . Number of children: Not on file  . Years of education: Not on file  . Highest education level: Not on file  Occupational History  . Not on file  Tobacco Use  . Smoking status: Never Smoker  . Smokeless tobacco: Never Used  Substance and Sexual Activity  . Alcohol use: No    Alcohol/week: 0.0 standard drinks  . Drug use: No  . Sexual activity: Not on file  Other Topics Concern  . Not on file  Social History Narrative  . Not on file   Social Determinants of Health   Financial Resource Strain:   . Difficulty of Paying Living Expenses: Not on file  Food Insecurity:   . Worried About Programme researcher, broadcasting/film/video in the Last Year: Not on file  . Ran Out of Food in the Last Year: Not on file  Transportation Needs:   . Lack of Transportation (Medical): Not on file  . Lack of Transportation (Non-Medical): Not on file  Physical Activity:   . Days of Exercise  per Week: Not on file  . Minutes of Exercise per Session: Not on file  Stress:   . Feeling of Stress : Not on file  Social Connections:   . Frequency of Communication with Friends and Family: Not on file  . Frequency of Social Gatherings with Friends and Family: Not on file  . Attends Religious Services: Not on file  . Active Member of Clubs or Organizations: Not on file  . Attends Archivist Meetings: Not on file  . Marital Status: Not on file  Intimate Partner Violence:   . Fear of Current or Ex-Partner: Not on file  . Emotionally  Abused: Not on file  . Physically Abused: Not on file  . Sexually Abused: Not on file    Past Surgical History:  Procedure Laterality Date  . ABDOMINAL HYSTERECTOMY  2011    Family History  Problem Relation Age of Onset  . Breast cancer Paternal Grandmother   . Prostate cancer Father   . Hypertension Other        both sides     Allergies  Allergen Reactions  . Sulfa Antibiotics Other (See Comments)    Hallucinations   . Other Rash    Powder gloves cause a rash    Current Outpatient Medications on File Prior to Visit  Medication Sig Dispense Refill  . cetirizine (ZYRTEC) 10 MG tablet Take 1 tablet (10 mg total) by mouth daily. 30 tablet 11  . fluticasone (FLONASE) 50 MCG/ACT nasal spray Place 2 sprays into both nostrils daily. 16 g 6  . ibuprofen (IBU) 800 MG tablet Take 1 tablet (800 mg total) by mouth every 8 (eight) hours as needed. 30 tablet 0  . lansoprazole (PREVACID) 15 MG capsule Take 1 capsule (15 mg total) by mouth daily at 12 noon. 30 capsule 0  . loratadine (CLARITIN) 10 MG tablet Take 1 tablet (10 mg total) by mouth daily. 30 tablet 11  . Olmesartan-amLODIPine-HCTZ 40-5-25 MG TABS TAKE 1 TABLET BY MOUTH DAILY. 30 tablet 0  . ondansetron (ZOFRAN ODT) 4 MG disintegrating tablet Take 1 tablet (4 mg total) by mouth every 8 (eight) hours as needed for nausea or vomiting. 21 tablet 0   No current facility-administered medications on file prior to visit.    BP 110/80   Pulse (!) 55   Temp 97.9 F (36.6 C) (Other (Comment))   Ht 5\' 7"  (1.702 m)   Wt 186 lb (84.4 kg)   SpO2 97%   BMI 29.13 kg/m       Objective:   Physical Exam Vitals and nursing note reviewed.  Constitutional:      General: She is not in acute distress.    Appearance: Normal appearance. She is well-developed.  HENT:     Head: Normocephalic and atraumatic.     Right Ear: Tympanic membrane, ear canal and external ear normal. There is no impacted cerumen.     Left Ear: Tympanic membrane,  ear canal and external ear normal. There is no impacted cerumen.     Nose: Nose normal. No congestion or rhinorrhea.     Mouth/Throat:     Mouth: Mucous membranes are moist.     Pharynx: Oropharynx is clear. No oropharyngeal exudate.  Eyes:     General:        Right eye: No discharge.        Left eye: No discharge.     Conjunctiva/sclera: Conjunctivae normal.     Pupils: Pupils are equal, round, and  reactive to light.  Neck:     Thyroid: No thyromegaly.     Trachea: No tracheal deviation.  Cardiovascular:     Rate and Rhythm: Normal rate and regular rhythm.     Pulses: Normal pulses.     Heart sounds: Normal heart sounds. No murmur. No friction rub. No gallop.   Pulmonary:     Effort: Pulmonary effort is normal. No respiratory distress.     Breath sounds: Normal breath sounds. No stridor. No wheezing, rhonchi or rales.  Chest:     Chest wall: No tenderness.  Abdominal:     General: Bowel sounds are normal. There is no distension.     Palpations: Abdomen is soft. There is no mass.     Tenderness: There is no abdominal tenderness. There is no left CVA tenderness, guarding or rebound.     Hernia: No hernia is present.  Musculoskeletal:        General: No swelling, tenderness, deformity or signs of injury. Normal range of motion.     Cervical back: Normal range of motion and neck supple.     Right lower leg: No edema.     Left lower leg: No edema.  Lymphadenopathy:     Cervical: No cervical adenopathy.  Skin:    General: Skin is warm and dry.     Capillary Refill: Capillary refill takes less than 2 seconds.     Coloration: Skin is not jaundiced or pale.     Findings: No bruising, erythema, lesion or rash.  Neurological:     General: No focal deficit present.     Mental Status: She is alert and oriented to person, place, and time. Mental status is at baseline.     Cranial Nerves: No cranial nerve deficit.     Motor: No weakness.     Coordination: Coordination normal.      Gait: Gait normal.     Deep Tendon Reflexes: Reflexes normal.  Psychiatric:        Mood and Affect: Mood normal.        Behavior: Behavior normal.        Thought Content: Thought content normal.        Judgment: Judgment normal.        Assessment & Plan:  1. 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 Essential hypertension - Well controlled. No change in medications  - CMP - Lipid panel - TSH - Hemoglobin A1c - Olmesartan-amLODIPine-HCTZ 40-5-25 MG TABS; Take 1 tablet by mouth daily.  Dispense: 90 tablet; Refill: 3  3. Anxiety and depression  - citalopram (CELEXA) 10 MG tablet; Take 1 tablet (10 mg total) by mouth at bedtime.  Dispense: 90 tablet; Refill: 1  4. Intractable migraine with aura with status migrainosus - Continue with OTC medication   5. Glucose intolerance - Consider metformin  - CBC with Differential/Platelet - CMP - Lipid panel - TSH - Hemoglobin A1c  6. Gastroesophageal reflux disease without esophagitis - Will switch to protonix to see if we can get better control  - pantoprazole (PROTONIX) 40 MG tablet; Take 1 tablet (40 mg total) by mouth daily.  Dispense: 90 tablet; Refill: 3   Shirline Frees, NP

## 2019-02-16 ENCOUNTER — Other Ambulatory Visit: Payer: Self-pay | Admitting: Adult Health

## 2019-02-16 DIAGNOSIS — R7303 Prediabetes: Secondary | ICD-10-CM

## 2019-02-16 MED ORDER — METFORMIN HCL 500 MG PO TABS: 250.0000 mg | ORAL_TABLET | Freq: Every day | ORAL | 0 refills | Status: DC

## 2019-02-16 MED FILL — metFORMIN HCL 500 MG TABS: 500 | 90 days supply | Qty: 45 | Fill #0

## 2019-03-07 MED FILL — OLMSRTN-AMLDPN-HCTZ 40-5-25: 40-5-25 | 60 days supply | Qty: 60 | Fill #0

## 2019-03-15 DIAGNOSIS — M4807 Spinal stenosis, lumbosacral region: Secondary | ICD-10-CM | POA: Diagnosis not present

## 2019-03-18 ENCOUNTER — Other Ambulatory Visit: Payer: Self-pay

## 2019-03-18 ENCOUNTER — Encounter (HOSPITAL_BASED_OUTPATIENT_CLINIC_OR_DEPARTMENT_OTHER): Payer: Self-pay | Admitting: *Deleted

## 2019-03-18 ENCOUNTER — Emergency Department (HOSPITAL_BASED_OUTPATIENT_CLINIC_OR_DEPARTMENT_OTHER): Payer: 59

## 2019-03-18 ENCOUNTER — Emergency Department (HOSPITAL_BASED_OUTPATIENT_CLINIC_OR_DEPARTMENT_OTHER)
Admission: EM | Admit: 2019-03-18 | Discharge: 2019-03-19 | Disposition: A | Discharge status: Home / Self Care | Payer: 59 | Attending: Emergency Medicine | Admitting: Emergency Medicine

## 2019-03-18 DIAGNOSIS — Z79899 Other long term (current) drug therapy: Secondary | ICD-10-CM | POA: Diagnosis not present

## 2019-03-18 DIAGNOSIS — R0789 Other chest pain: Secondary | ICD-10-CM | POA: Insufficient documentation

## 2019-03-18 DIAGNOSIS — I10 Essential (primary) hypertension: Secondary | ICD-10-CM | POA: Insufficient documentation

## 2019-03-18 DIAGNOSIS — R079 Chest pain, unspecified: Secondary | ICD-10-CM | POA: Diagnosis not present

## 2019-03-18 LAB — CBC
HCT: 41.6 % (ref 36.0–46.0)
Hemoglobin: 13.6 g/dL (ref 12.0–15.0)
MCH: 27.5 pg (ref 26.0–34.0)
MCHC: 32.7 g/dL (ref 30.0–36.0)
MCV: 84.2 fL (ref 80.0–100.0)
Platelets: 328 10*3/uL (ref 150–400)
RBC: 4.94 MIL/uL (ref 3.87–5.11)
RDW: 14.8 % (ref 11.5–15.5)
WBC: 6 10*3/uL (ref 4.0–10.5)
nRBC: 0 % (ref 0.0–0.2)

## 2019-03-18 LAB — BASIC METABOLIC PANEL
Anion gap: 9 (ref 5–15)
BUN: 11 mg/dL (ref 6–20)
CO2: 25 mmol/L (ref 22–32)
Calcium: 9.6 mg/dL (ref 8.9–10.3)
Chloride: 102 mmol/L (ref 98–111)
Creatinine, Ser: 0.78 mg/dL (ref 0.44–1.00)
GFR calc Af Amer: >60 mL/min (ref >60)
GFR calc non Af Amer: >60 mL/min (ref >60)
Glucose, Bld: 101 mg/dL — ABNORMAL HIGH (ref 70–99)
Potassium: 3.5 mmol/L (ref 3.5–5.1)
Sodium: 136 mmol/L (ref 135–145)

## 2019-03-18 LAB — TROPONIN I (HIGH SENSITIVITY)
Troponin I (High Sensitivity): 3 ng/L (ref <18)
Troponin I (High Sensitivity): 4 ng/L (ref <18)

## 2019-03-18 NOTE — ED Triage Notes (Signed)
Pt reports left chest  And shoulder pain off and on for months. States today she had left side chest pressure at work. Reports some relief with belching

## 2019-03-18 NOTE — ED Provider Notes (Signed)
[facility] EMERGENCY DEPARTMENT Provider Note   CSN: [id number] Arrival date & time: 03/18/19  1931     History Chief Complaint  Patient presents with  . Chest Pain    Labrisha Wuellner Ruggles is a 49 y.o. female.  Patient is a 49 year old female who presents with chest pain.  30-month history of pain in her left chest.  Its nonradiating.  She describes it as a heaviness across her chest.  She has it pretty much daily for the last 7 months.  She says that over the last few days sometimes it has come on with some physical activity however not consistently.  She does not feel that the pain is gotten any worse over the last few days.  No associated shortness of breath although at times she does have some shortness of breath when she is exerting herself.  She has no nausea or vomiting.  No associated diaphoresis.  She gained about 20 pounds during the Covid epidemic and stopped working out.  She recently started working out again about a week ago.  She last exercised 2 days ago and walked on a treadmill for 30 minutes and had no symptoms during that episode.  She says it actually seems to be better when she sitting still and lying flat.  When she is sitting up is when it seems to be the worst and she feels a heaviness the worse.  She says at times it gets better when she is walking around.  She has a history of hypertension.  She recently had a physical about a month ago where she had an elevated hemoglobin A1c and elevated cholesterol levels.  She was started on Metformin but has not yet started any lipid-lowering medications and is trying diet control first.  No prior history of heart disease.  She has a family history of heart disease in her mom in her mid 39s.  She is a non-smoker.        Past Medical History:  Diagnosis Date  . Depression   . Hypertension    high blood pressure readings   . Migraine     Patient Active Problem List   Diagnosis Date Noted  . Anxiety and depression  02/15/2019  . Carpal tunnel syndrome 04/13/2018  . Degenerative disc disease at L5-S1 level 01/24/2018  . Essential hypertension 12/12/2013    Past Surgical History:  Procedure Laterality Date  . ABDOMINAL HYSTERECTOMY  2011     OB History   No obstetric history on file.     Family History  Problem Relation Age of Onset  . Breast cancer Paternal Grandmother   . Prostate cancer Father   . Hypertension Other        both sides     Social History   Tobacco Use  . Smoking status: Never Smoker  . Smokeless tobacco: Never Used  Substance Use Topics  . Alcohol use: No    Alcohol/week: 0.0 standard drinks  . Drug use: No    Home Medications Prior to Admission medications   Medication Sig Start Date End Date Taking? Authorizing Provider  cetirizine (ZYRTEC) 10 MG tablet Take 1 tablet (10 mg total) by mouth daily. 05/27/17  Yes Wallis Bamberg, PA-C  ibuprofen (IBU) 800 MG tablet Take 1 tablet (800 mg total) by mouth every 8 (eight) hours as needed. 01/22/19  Yes Judi Saa, DO  metFORMIN (GLUCOPHAGE) 500 MG tablet Take 0.5 tablets (250 mg total) by mouth daily with breakfast. 02/16/19 05/17/19  Yes Nafziger, Tommi Rumps, NP  Olmesartan-amLODIPine-HCTZ 40-5-25 MG TABS Take 1 tablet by mouth daily. 02/15/19  Yes Nafziger, Tommi Rumps, NP  pantoprazole (PROTONIX) 40 MG tablet Take 1 tablet (40 mg total) by mouth daily. 02/15/19 05/16/19 Yes Nafziger, Tommi Rumps, NP  citalopram (CELEXA) 10 MG tablet Take 1 tablet (10 mg total) by mouth at bedtime. 02/15/19   Nafziger, Tommi Rumps, NP  fluticasone (FLONASE) 50 MCG/ACT nasal spray Place 2 sprays into both nostrils daily. 05/27/17   Jaynee Eagles, PA-C    Allergies    Sulfa antibiotics and Other  Review of Systems   Review of Systems  Constitutional: Negative for chills, diaphoresis, fatigue and fever.  HENT: Negative for congestion, rhinorrhea and sneezing.   Eyes: Negative.   Respiratory: Positive for shortness of breath. Negative for cough and chest tightness.     Cardiovascular: Positive for chest pain. Negative for leg swelling.  Gastrointestinal: Negative for abdominal pain, blood in stool, diarrhea, nausea and vomiting.  Genitourinary: Negative for difficulty urinating, flank pain, frequency and hematuria.  Musculoskeletal: Negative for arthralgias and back pain.  Skin: Negative for rash.  Neurological: Negative for dizziness, speech difficulty, weakness, numbness and headaches.    Physical Exam Updated Vital Signs BP (!) 160/102 (BP Location: Right Arm)   Pulse 69   Temp 98.2 F (36.8 C) (Oral)   Resp 18   Ht 5\' 7"  (1.702 m)   Wt 86.6 kg   SpO2 99%   BMI 29.91 kg/m   Physical Exam Constitutional:      Appearance: She is well-developed.  HENT:     Head: Normocephalic and atraumatic.  Eyes:     Pupils: Pupils are equal, round, and reactive to light.  Cardiovascular:     Rate and Rhythm: Normal rate and regular rhythm.     Heart sounds: Normal heart sounds.  Pulmonary:     Effort: Pulmonary effort is normal. No respiratory distress.     Breath sounds: Normal breath sounds. No wheezing or rales.  Chest:     Chest wall: Tenderness (Some tenderness to palpation of the left upper chest wall, no crepitus or deformity) present.  Abdominal:     General: Bowel sounds are normal.     Palpations: Abdomen is soft.     Tenderness: There is no abdominal tenderness. There is no guarding or rebound.  Musculoskeletal:        General: Normal range of motion.     Cervical back: Normal range of motion and neck supple.  Lymphadenopathy:     Cervical: No cervical adenopathy.  Skin:    General: Skin is warm and dry.     Findings: No rash.  Neurological:     Mental Status: She is alert and oriented to person, place, and time.     ED Results / Procedures / Treatments   Labs (all labs ordered are listed, but only abnormal results are displayed) Labs Reviewed  BASIC METABOLIC PANEL - Abnormal; Notable for the following components:       Result Value   Glucose, Bld 101 (*)    All other components within normal limits  CBC  TROPONIN I (HIGH SENSITIVITY)  TROPONIN I (HIGH SENSITIVITY)    EKG EKG Interpretation  Date/Time:  Sunday March 18 2019 19:42:45 EST Ventricular Rate:  72 PR Interval:    QRS Duration: 94 QT Interval:  398 QTC Calculation: 436 R Axis:     Text Interpretation: Sinus rhythm Consider left atrial enlargement Baseline wander in lead(s) V4 since last  tracing no significant change Confirmed by Rolan Bucco [phone]) on 03/18/2019 7:50:11 PM   Radiology DG Chest 2 View  Result Date: 03/18/2019 CLINICAL DATA:  Left-sided chest pain and shoulder pain. EXAM: CHEST - 2 VIEW COMPARISON:  September 09, 2006 FINDINGS: The heart size and mediastinal contours are within normal limits. Both lungs are clear. Bilateral radiopaque nipple piercings are seen. The visualized skeletal structures are unremarkable. IMPRESSION: No active cardiopulmonary disease. Electronically Signed   By: Aram Candela M.D.   On: 03/18/2019 20:00    Procedures Procedures (including critical care time)  Medications Ordered in ED Medications - No data to display  ED Course  I have reviewed the triage vital signs and the nursing notes.  Pertinent labs & imaging results that were available during my care of the patient were reviewed by me and considered in my medical decision making (see chart for details).    MDM Rules/Calculators/A&P                      Patient is a 49 year old female who presents with chest pain.  It is left-sided.  There is some reproducibility with palpation of left chest.  She had no ischemic changes on EKG.  Chest x-ray is clear without evidence of pneumonia or pneumothorax or other acute abnormality.  She has had 2 - troponins.  Her other labs are nonconcerning.  She did say that at times it got worse when she is active although it does not sound discretely exertional.  She walked on a treadmill first 30  minutes 2 days ago without any symptoms.  She says that the pain actually is worse if she sitting up in a chair in better at times when she walks around.  Is also been going on for about 7 months with no significant changes although it seems to be a little more constant in the last few days.  I feel that she is appropriate for discharge.  I do not see any more concerning findings for ACS.  I do feel though that since this is been going on for some time and she now has a question of some exertional symptoms that would be prudent for her to follow-up with cardiology.  I will give her outpatient referral to cardiology which she is amenable to.  She was given strict return precautions. Final Clinical Impression(s) / ED Diagnoses Final diagnoses:  Atypical chest pain    Rx / DC Orders ED Discharge Orders    None       Rolan Bucco, MD 03/18/19 2318

## 2019-03-18 NOTE — ED Notes (Signed)
Pt to XR

## 2019-03-18 NOTE — ED Notes (Signed)
ED Provider at bedside. 

## 2019-04-09 MED FILL — GABAPENTIN 100 MG CAPSULE: 100 | 30 days supply | Qty: 60 | Fill #1

## 2019-05-09 ENCOUNTER — Other Ambulatory Visit: Payer: Self-pay | Admitting: Adult Health

## 2019-05-09 DIAGNOSIS — R7303 Prediabetes: Secondary | ICD-10-CM

## 2019-05-10 NOTE — Telephone Encounter (Signed)
Pt due for A1C.  Tried to reach the pt by phone.  Received an automated message that the voicemail box has not been set up.  Will try again at a later time.

## 2019-05-10 NOTE — Telephone Encounter (Signed)
Sent to the pharmacy by e-scribe. 

## 2019-05-10 NOTE — Telephone Encounter (Signed)
Appointment has been scheduled.

## 2019-05-16 ENCOUNTER — Other Ambulatory Visit: Payer: Self-pay

## 2019-05-17 ENCOUNTER — Encounter: Payer: Self-pay | Admitting: Adult Health

## 2019-05-17 ENCOUNTER — Ambulatory Visit (INDEPENDENT_AMBULATORY_CARE_PROVIDER_SITE_OTHER): Payer: 59 | Admitting: Adult Health

## 2019-05-17 VITALS — BP 122/92 | Temp 97.8°F | Wt 189.0 lb

## 2019-05-17 DIAGNOSIS — R7303 Prediabetes: Secondary | ICD-10-CM

## 2019-05-17 LAB — POCT GLYCOSYLATED HEMOGLOBIN (HGB A1C): HbA1c, POC (controlled diabetic range): 5.8 % (ref 0.0–7.0)

## 2019-05-17 MED ORDER — IBUPROFEN 800 MG PO TABS: 800.0000 mg | ORAL_TABLET | Freq: Three times a day (TID) | ORAL | 3 refills | Status: DC | PRN

## 2019-05-17 MED FILL — IBUPROFEN 800 MG TAB: 800 | 10 days supply | Qty: 30 | Fill #0

## 2019-05-17 NOTE — Patient Instructions (Signed)
Your A1c is 5.8 - this is an improvement   Keep working on diet and exercise   We will follow up in 6 months

## 2019-05-17 NOTE — Progress Notes (Signed)
Subjective:    Patient ID: Valerie Gilmore, female    DOB: 1970-12-04, 49 y.o.   MRN: [id number]  HPI 49 year old female who  has a past medical history of Depression, Hypertension, and Migraine.  She presents to the office today for 14-month follow-up for glucose intolerance.  During her physical in January her A1c had increased from 6.0-6.3.  She was placed on Metformin 250 mg daily as prophylactic measure.  She reports that she has responded well to Metformin and denies any side effects to the medication.  She continues to exercise and has started to eat healthier.   Wt Readings from Last 3 Encounters:  05/17/19 189 lb (85.7 kg)  03/18/19 191 lb (86.6 kg)  02/15/19 186 lb (84.4 kg)    Review of Systems See HPI   Past Medical History:  Diagnosis Date  . Depression   . Hypertension    high blood pressure readings   . Migraine     Social History   Socioeconomic History  . Marital status: Single    Spouse name: Not on file  . Number of children: Not on file  . Years of education: Not on file  . Highest education level: Not on file  Occupational History  . Not on file  Tobacco Use  . Smoking status: Never Smoker  . Smokeless tobacco: Never Used  Substance and Sexual Activity  . Alcohol use: No    Alcohol/week: 0.0 standard drinks  . Drug use: No  . Sexual activity: Not on file  Other Topics Concern  . Not on file  Social History Narrative  . Not on file   Social Determinants of Health   Financial Resource Strain:   . Difficulty of Paying Living Expenses:   Food Insecurity:   . Worried About Programme researcher, broadcasting/film/video in the Last Year:   . Barista in the Last Year:   Transportation Needs:   . Freight forwarder (Medical):   Marland Kitchen Lack of Transportation (Non-Medical):   Physical Activity:   . Days of Exercise per Week:   . Minutes of Exercise per Session:   Stress:   . Feeling of Stress :   Social Connections:   . Frequency of Communication with Friends  and Family:   . Frequency of Social Gatherings with Friends and Family:   . Attends Religious Services:   . Active Member of Clubs or Organizations:   . Attends Banker Meetings:   Marland Kitchen Marital Status:   Intimate Partner Violence:   . Fear of Current or Ex-Partner:   . Emotionally Abused:   Marland Kitchen Physically Abused:   . Sexually Abused:     Past Surgical History:  Procedure Laterality Date  . ABDOMINAL HYSTERECTOMY  2011    Family History  Problem Relation Age of Onset  . Breast cancer Paternal Grandmother   . Prostate cancer Father   . Hypertension Other        both sides     Allergies  Allergen Reactions  . Sulfa Antibiotics Other (See Comments)    Hallucinations   . Other Rash    Powder gloves cause a rash    Current Outpatient Medications on File Prior to Visit  Medication Sig Dispense Refill  . cetirizine (ZYRTEC) 10 MG tablet Take 1 tablet (10 mg total) by mouth daily. 30 tablet 11  . citalopram (CELEXA) 10 MG tablet Take 1 tablet (10 mg total) by mouth at bedtime. 90 tablet  1  . fluticasone (FLONASE) 50 MCG/ACT nasal spray Place 2 sprays into both nostrils daily. 16 g 6  . metFORMIN (GLUCOPHAGE) 500 MG tablet TAKE 1/2 TABLET BY MOUTH ONCE A DAY WITH BREAKFAST 45 tablet 0  . Olmesartan-amLODIPine-HCTZ 40-5-25 MG TABS Take 1 tablet by mouth daily. 90 tablet 3  . pantoprazole (PROTONIX) 40 MG tablet Take 1 tablet (40 mg total) by mouth daily. 90 tablet 3   No current facility-administered medications on file prior to visit.    BP (!) 122/92   Temp 97.8 F (36.6 C)   Wt 189 lb (85.7 kg)   BMI 29.60 kg/m       Objective:   Physical Exam Vitals and nursing note reviewed.  Constitutional:      Appearance: Normal appearance.  Cardiovascular:     Rate and Rhythm: Normal rate and regular rhythm.     Pulses: Normal pulses.     Heart sounds: Normal heart sounds.  Pulmonary:     Effort: Pulmonary effort is normal.     Breath sounds: Normal breath  sounds.  Musculoskeletal:        General: Normal range of motion.  Skin:    General: Skin is warm and dry.     Capillary Refill: Capillary refill takes less than 2 seconds.  Neurological:     General: No focal deficit present.     Mental Status: She is alert and oriented to person, place, and time.  Psychiatric:        Mood and Affect: Mood normal.        Behavior: Behavior normal.        Thought Content: Thought content normal.        Judgment: Judgment normal.       Assessment & Plan:  1. Pre-diabetes  - POCT A1C- 5.8 - Improved - Continue to work on diet and exercise - Continue metformin  - Follow up in 6 months   Dorothyann Peng, NP

## 2019-05-24 ENCOUNTER — Other Ambulatory Visit: Payer: Self-pay | Admitting: Family Medicine

## 2019-05-24 MED FILL — METFORMIN HCL 500 MG TABS: 500 | 90 days supply | Qty: 45 | Fill #0

## 2019-05-24 MED FILL — PANTOPRAZOLE SOD DR 40 MG T: 40 | 90 days supply | Qty: 90 | Fill #1

## 2019-05-24 MED FILL — GABAPENTIN 100 MG CAPSULE: 100 | 30 days supply | Qty: 60 | Fill #0

## 2019-05-31 ENCOUNTER — Ambulatory Visit: Payer: 59 | Attending: Internal Medicine

## 2019-05-31 DIAGNOSIS — Z23 Encounter for immunization: Secondary | ICD-10-CM

## 2019-05-31 NOTE — Progress Notes (Signed)
   Covid-19 Vaccination [facility]  Name:  JUNIOR KENEDY    MRN: [id number] DOB: 1970/06/24  05/31/2019  Ms. Haslem was observed post Covid-19 immunization for 15 minutes without incident. She was provided with Vaccine Information Sheet and instruction to access the V-Safe system.   Ms. Ringle was instructed to call 911 with any severe reactions post vaccine: Marland Kitchen Difficulty breathing  . Swelling of face and throat  . A fast heartbeat  . A bad rash all over body  . Dizziness and weakness   Immunizations Administered    Name Date Dose VIS Date Route   Pfizer COVID-19 Vaccine 05/31/2019  3:27 PM 0.3 mL 04/04/2018 Intramuscular   Manufacturer: [employer], [employer]   Lot: [id number]   NDC: [address]-0

## 2019-06-11 MED FILL — OLMSRTN-AMLDPN-HCTZ 40-5-25: 40-5-25 | 60 days supply | Qty: 60 | Fill #1

## 2019-06-16 IMAGING — MR MR HEAD W/O CM
10 series · 48 of 48 positions shown · non-contrast
Comparison: None.

CLINICAL DATA: Chronic headache

EXAM:
MRI HEAD WITHOUT CONTRAST
TECHNIQUE: Multiplanar, multiecho pulse sequences of the brain and surrounding
structures were obtained without intravenous contrast.

[Series 2: T1 · sagittal · 5.0mm · 0.45mm/px · 2 of 21 slices shown]
[im 1/21]
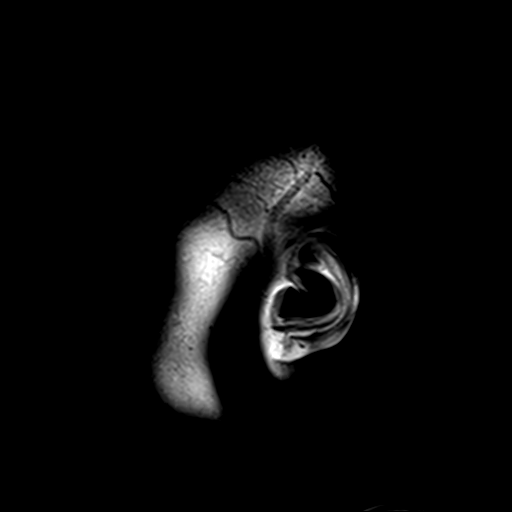
[im 21/21]
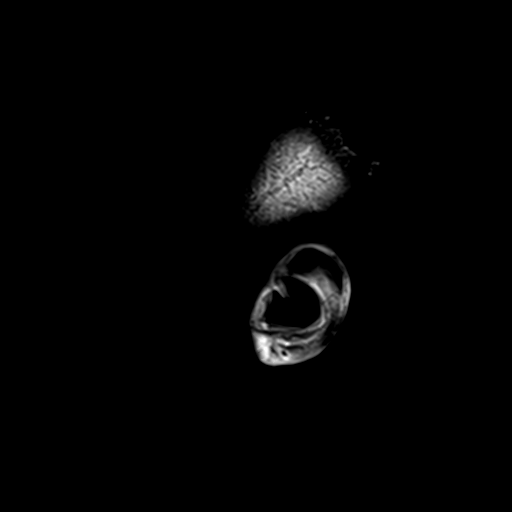

[Series 3: DWI · axial · 3.0mm · 1.80mm/px · z∈[-36,+111]mm · 9 of 99 slices shown (1 of 4)]
[im 1/99]
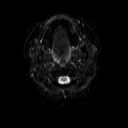
[im 13/99]
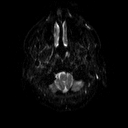
[im 25/99]
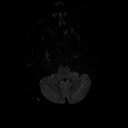
[im 37/99]
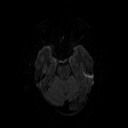
[im 50/99]
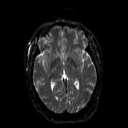
[im 62/99]
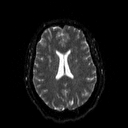
[im 74/99]
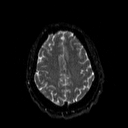
[im 86/99]
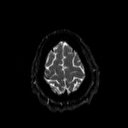
[im 99/99]
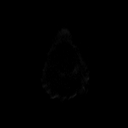

[Series 4: DWI · axial · 3.0mm · 1.80mm/px · z∈[-36,+111]mm · 5 of 50 slices shown (2 of 4)]
[im 1/50]
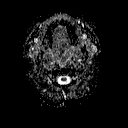
[im 13/50]
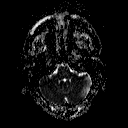
[im 25/50]
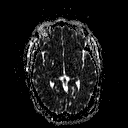
[im 37/50]
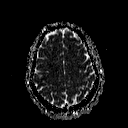
[im 50/50]
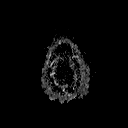

[Series 5: DWI · coronal · 5.0mm · 1.80mm/px · 6 of 65 slices shown (3 of 4)]
[im 1/65]
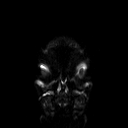
[im 13/65]
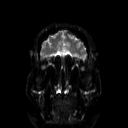
[im 26/65]
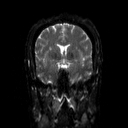
[im 39/65]
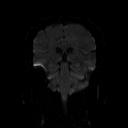
[im 52/65]
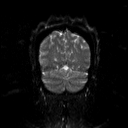
[im 65/65]
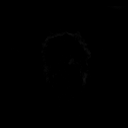

[Series 6: DWI · coronal · 5.0mm · 1.80mm/px · 3 of 34 slices shown (4 of 4)]
[im 1/34]
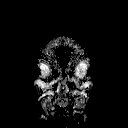
[im 17/34]
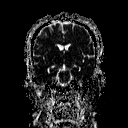
[im 34/34]
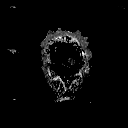

[Series 7: T2 · axial · 5.0mm · 0.60mm/px · z∈[-38,+108]mm · 2 of 22 slices shown (1 of 2)]
[im 1/22]
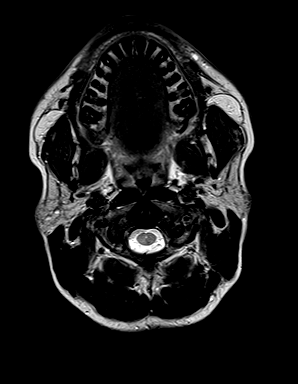
[im 22/22]
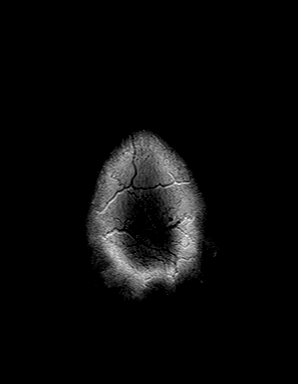

[Series 8: FLAIR · axial · 3.0mm · 0.45mm/px · z∈[-40,+109]mm · 3 of 33 slices shown]
[im 1/33]
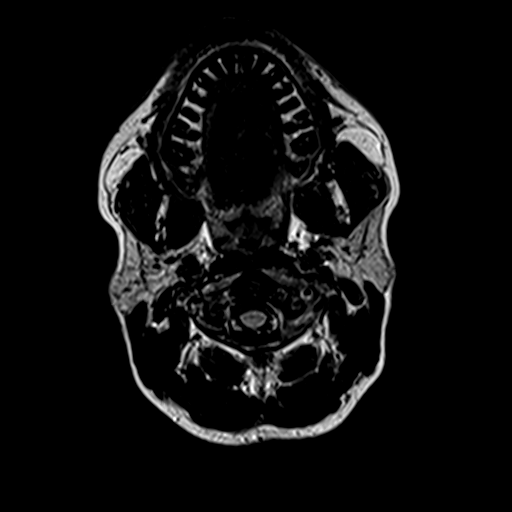
[im 17/33]
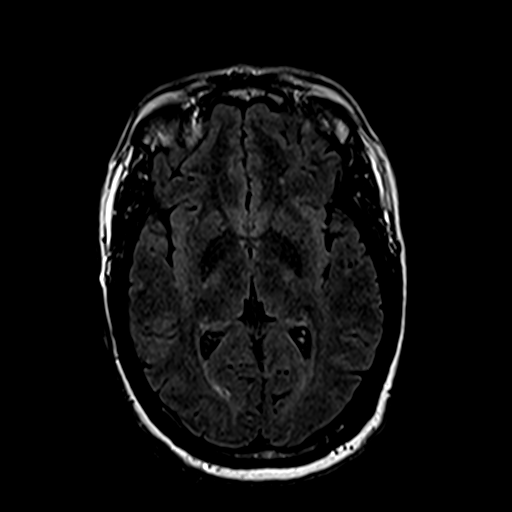
[im 33/33]
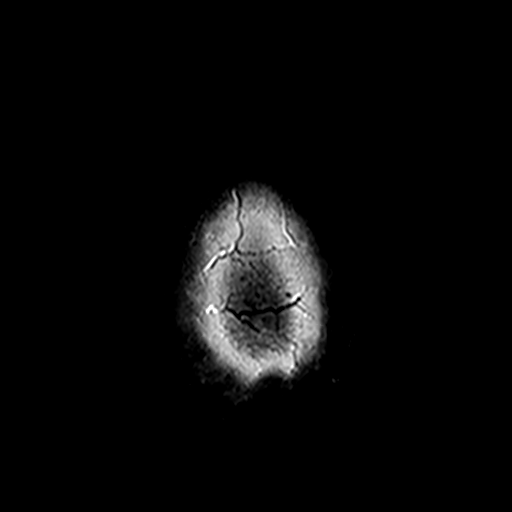

[Series 10: swi_images · axial · 5.0mm · 0.90mm/px · z∈[-38,+107]mm · 3 of 30 slices shown]
[im 1/30]
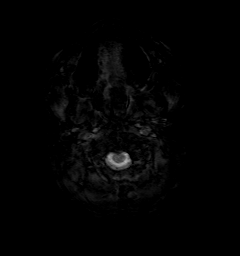
[im 15/30]
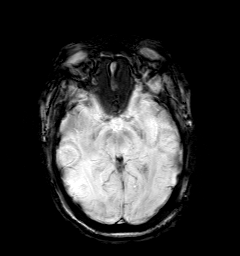
[im 30/30]
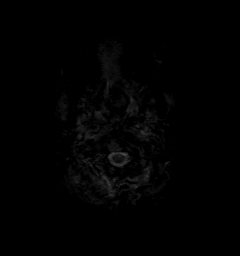

[Series 11: t1_mpr_tra · axial · 1.0mm · 0.71mm/px · z∈[-36,+106]mm · 13 of 144 slices shown]
[im 1/144]
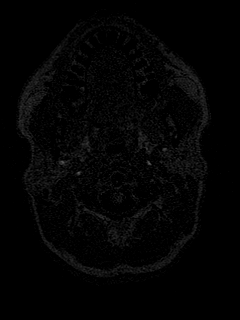
[im 12/144]
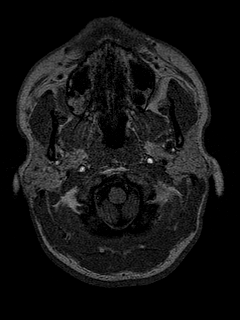
[im 24/144]
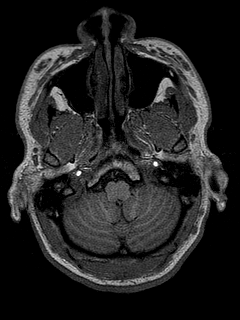
[im 36/144]
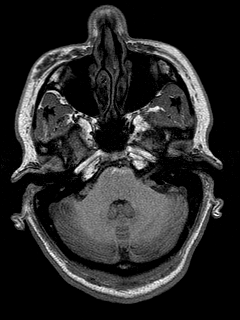
[im 48/144]
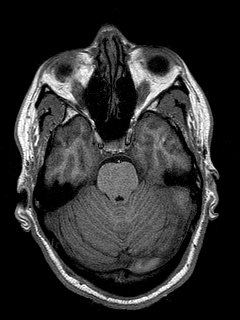
[im 60/144]
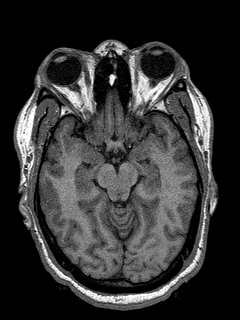
[im 72/144]
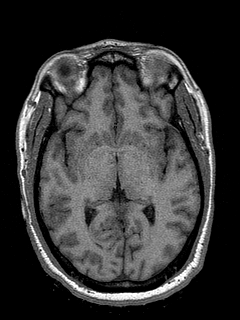
[im 84/144]
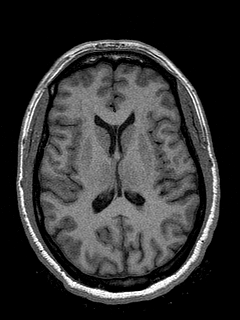
[im 96/144]
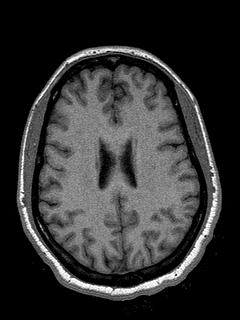
[im 108/144]
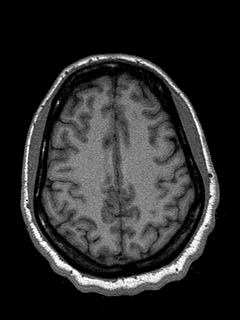
[im 120/144]
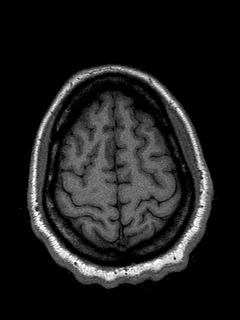
[im 132/144]
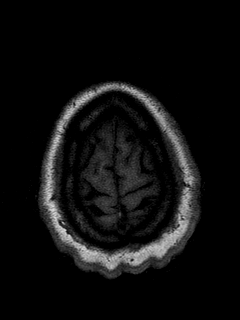
[im 144/144]
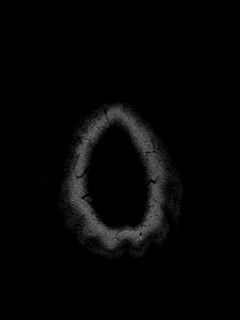

[Series 12: T2 · coronal · 5.0mm · 0.45mm/px · 2 of 25 slices shown (2 of 2)]
[im 1/25]
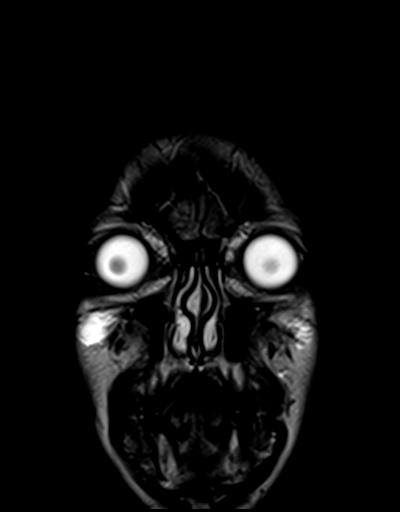
[im 25/25]
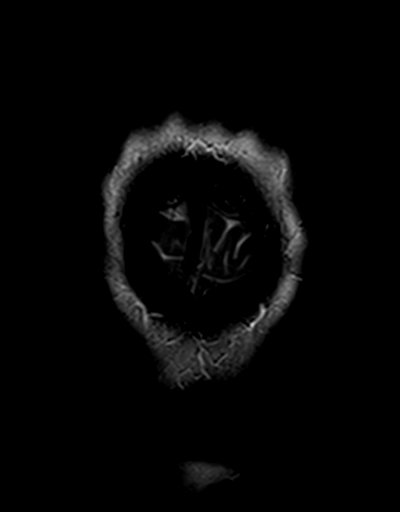

[48 of 48 positions shown; findings below may reference images not displayed]

FINDINGS: Brain: No infarction, hemorrhage, hydrocephalus, extra-axial
collection or mass lesion. Patchy FLAIR hyperintensity in the deep
cerebral white matter with nonspecific pattern, usually from chronic
small vessel ischemia given patient's history of hypertension.
Normal brain volume.

Vascular: Major flow voids are preserved

Skull and upper cervical spine: Negative for marrow lesion

Sinuses/Orbits: Negative.  No sinusitis.

Other: Findings of cosmetic injections in the cheeks.
IMPRESSION: 1. No specific explanation for headache.
2. Minor remote white matter insult, usually microvascular ischemic.

## 2019-06-25 ENCOUNTER — Ambulatory Visit: Payer: 59

## 2019-06-28 ENCOUNTER — Ambulatory Visit: Payer: 59 | Attending: Family

## 2019-06-28 DIAGNOSIS — Z23 Encounter for immunization: Secondary | ICD-10-CM

## 2019-06-28 NOTE — Progress Notes (Signed)
   Covid-19 Vaccination Clinic  Name:  Valerie Gilmore    MRN: [id number] DOB: 05/17/1970  06/28/2019  [name] was observed post Covid-19 immunization for 15 minutes without incident. She was provided with Vaccine Information Sheet and instruction to access the V-Safe system.   [name] was instructed to call 911 with any severe reactions post vaccine: Marland Kitchen Difficulty breathing  . Swelling of face and throat  . A fast heartbeat  . A bad rash all over body  . Dizziness and weakness   Immunizations Administered    Name Date Dose VIS Date Route   Pfizer COVID-19 Vaccine 06/28/2019  1:17 PM 0.3 mL 04/04/2018 Intramuscular   Manufacturer: [employer], [employer]   Lot: [id number]   NDC: [address]-3      Covid-19 Vaccination Clinic  Name:  Valerie Gilmore    MRN: [id number] DOB: 1970-09-24  06/28/2019  [name] was observed post Covid-19 immunization for 15 minutes without incident. She was provided with Vaccine Information Sheet and instruction to access the V-Safe system.   [name] was instructed to call 911 with any severe reactions post vaccine: Marland Kitchen Difficulty breathing  . Swelling of face and throat  . A fast heartbeat  . A bad rash all over body  . Dizziness and weakness   Immunizations Administered    Name Date Dose VIS Date Route   Pfizer COVID-19 Vaccine 06/28/2019  1:17 PM 0.3 mL 04/04/2018 Intramuscular   Manufacturer: [employer], [employer]   Lot: [id number]   NDC: [address]-8

## 2019-07-11 DIAGNOSIS — M4807 Spinal stenosis, lumbosacral region: Secondary | ICD-10-CM | POA: Diagnosis not present

## 2019-07-11 DIAGNOSIS — M5416 Radiculopathy, lumbar region: Secondary | ICD-10-CM | POA: Diagnosis not present

## 2019-07-11 DIAGNOSIS — I10 Essential (primary) hypertension: Secondary | ICD-10-CM | POA: Diagnosis not present

## 2019-07-11 DIAGNOSIS — Z6829 Body mass index (BMI) 29.0-29.9, adult: Secondary | ICD-10-CM | POA: Diagnosis not present

## 2019-07-11 MED FILL — IBUPROFEN 800 MG TAB: 800 | 30 days supply | Qty: 90 | Fill #0

## 2019-07-11 MED FILL — GABAPENTIN 300 MG CAPSULE: 300 | 30 days supply | Qty: 90 | Fill #0

## 2019-07-12 ENCOUNTER — Encounter: Payer: Self-pay | Admitting: Family Medicine

## 2019-07-12 ENCOUNTER — Telehealth (INDEPENDENT_AMBULATORY_CARE_PROVIDER_SITE_OTHER): Payer: 59 | Admitting: Family Medicine

## 2019-07-12 DIAGNOSIS — R21 Rash and other nonspecific skin eruption: Secondary | ICD-10-CM | POA: Diagnosis not present

## 2019-07-12 MED ORDER — TRIAMCINOLONE ACETONIDE 0.1 % EX CREA: 1.0000 "application " | TOPICAL_CREAM | Freq: Two times a day (BID) | CUTANEOUS | 0 refills | Status: DC

## 2019-07-12 MED FILL — TRIAMCINOLONE ACETONIDE 0.1: 0.1 | 20 days supply | Qty: 30 | Fill #0

## 2019-07-12 NOTE — Patient Instructions (Signed)
Trial of the topical steroid - small amount 1-2 times daily. Avoid use in creases.  Avoid latex.  Follow up or seek in person care if worsening, new symptoms or if symptoms do not resolve in next 3-5 days.

## 2019-07-12 NOTE — Progress Notes (Signed)
Virtual Visit via Video Note  I connected with Valerie Gilmore  on 07/12/19 at 12:00 PM EDT by a video enabled telemedicine application and verified that I am speaking with the correct person using two identifiers.  Location patient: home, [place] Location provider:work or home office Persons participating in the virtual visit: patient, provider  I discussed the limitations of evaluation and management by telemedicine and the availability of in person appointments. The patient expressed understanding and agreed to proceed.   HPI:  Acute visit for a Rash: -started about 1 week ago after starting to wear a latex waist trainer -rash just under the breast where the top of the waist trainer hit -reports papulovesicular lesions, itchy -denies pain or burning sensation -feels like lesions stopped appearing and started to heal when stopped wearing the waist trainer -reports she does have a possible latex allergy as gets itchy if uses latex gloves -no fevers, malaise or other symptoms -did have a lesion on the R shoulder as well, but is not sure if it is related -denies any other exposures, tick bites, outdoor exposure, pets   ROS: See pertinent positives and negatives per HPI.  Past Medical History:  Diagnosis Date  . Depression   . Hypertension    high blood pressure readings   . Migraine     Past Surgical History:  Procedure Laterality Date  . ABDOMINAL HYSTERECTOMY  2011    Family History  Problem Relation Age of Onset  . Breast cancer Paternal Grandmother   . Prostate cancer Father   . Hypertension Other        both sides     SOCIAL HX: see hpi   Current Outpatient Medications:  .  cetirizine (ZYRTEC) 10 MG tablet, Take 1 tablet (10 mg total) by mouth daily., Disp: 30 tablet, Rfl: 11 .  citalopram (CELEXA) 10 MG tablet, Take 1 tablet (10 mg total) by mouth at bedtime., Disp: 90 tablet, Rfl: 1 .  fluticasone (FLONASE) 50 MCG/ACT nasal spray, Place 2 sprays into both nostrils  daily., Disp: 16 g, Rfl: 6 .  gabapentin (NEURONTIN) 100 MG capsule, TAKE 2 CAPSULES (200 MG TOTAL) BY MOUTH AT BEDTIME., Disp: 60 capsule, Rfl: 3 .  ibuprofen (IBU) 800 MG tablet, Take 1 tablet (800 mg total) by mouth every 8 (eight) hours as needed., Disp: 30 tablet, Rfl: 3 .  metFORMIN (GLUCOPHAGE) 500 MG tablet, TAKE 1/2 TABLET BY MOUTH ONCE A DAY WITH BREAKFAST, Disp: 45 tablet, Rfl: 0 .  Olmesartan-amLODIPine-HCTZ 40-5-25 MG TABS, Take 1 tablet by mouth daily., Disp: 90 tablet, Rfl: 3 .  pantoprazole (PROTONIX) 40 MG tablet, Take 1 tablet (40 mg total) by mouth daily., Disp: 90 tablet, Rfl: 3 .  triamcinolone cream (KENALOG) 0.1 %, Apply 1 application topically 2 (two) times daily., Disp: 30 g, Rfl: 0  EXAM:  VITALS per patient if applicable:  GENERAL: alert, oriented, appears well and in no acute distress  HEENT: atraumatic, conjunttiva clear, no obvious abnormalities on inspection of external nose and ears  NECK: normal movements of the head and neck  LUNGS: on inspection no signs of respiratory distress, breathing rate appears normal, no obvious gross SOB, gasping or wheezing  CV: no obvious cyanosis  SKIN: several scabbed small papular lesions just under bra line on trunk  MS: moves all visible extremities without noticeable abnormality  PSYCH/NEURO: pleasant and cooperative, no obvious depression or anxiety, speech and thought processing grossly intact  ASSESSMENT AND PLAN:  Discussed the following assessment and plan:  Rash  -we discussed possible serious and likely etiologies, options for evaluation and workup, limitations of telemedicine visit vs in person visit, treatment, treatment risks and precautions. Pt prefers to treat via telemedicine empirically rather then risking or undertaking an in person visit at this moment. Lesions appear scabbed currently and are difficult to assess. Given hx and exam query contact dermatitis vs other. Opted for trial topical steroid  and avoidance latex after discussion risks and use with follow up inperson if worsening or not resolving. Patient agrees to seek prompt in person care if worsening, new symptoms arise, or if is not improving with treatment. She declines scheduled follow up but agrees to call if needed.   I discussed the assessment and treatment plan with the patient. The patient was provided an opportunity to ask questions and all were answered. The patient agreed with the plan and demonstrated an understanding of the instructions.   The patient was advised to call back or seek an in-person evaluation if the symptoms worsen or if the condition fails to improve as anticipated.   Terressa Koyanagi, DO

## 2019-07-21 ENCOUNTER — Encounter: Payer: Self-pay | Admitting: Adult Health

## 2019-07-23 ENCOUNTER — Other Ambulatory Visit (HOSPITAL_COMMUNITY)
Admission: RE | Admit: 2019-07-23 | Discharge: 2019-07-23 | Disposition: A | Discharge status: Home / Self Care | Payer: 59 | Source: Ambulatory Visit | Attending: Family Medicine | Admitting: Family Medicine

## 2019-07-23 ENCOUNTER — Ambulatory Visit (INDEPENDENT_AMBULATORY_CARE_PROVIDER_SITE_OTHER): Payer: 59 | Admitting: Family Medicine

## 2019-07-23 ENCOUNTER — Other Ambulatory Visit: Payer: Self-pay

## 2019-07-23 ENCOUNTER — Encounter: Payer: Self-pay | Admitting: Family Medicine

## 2019-07-23 VITALS — BP 126/82 | HR 79 | Temp 98.1°F | Resp 12 | Ht 67.0 in | Wt 189.1 lb

## 2019-07-23 DIAGNOSIS — N898 Other specified noninflammatory disorders of vagina: Secondary | ICD-10-CM

## 2019-07-23 DIAGNOSIS — B354 Tinea corporis: Secondary | ICD-10-CM | POA: Diagnosis not present

## 2019-07-23 DIAGNOSIS — B373 Candidiasis of vulva and vagina: Secondary | ICD-10-CM

## 2019-07-23 DIAGNOSIS — R300 Dysuria: Secondary | ICD-10-CM

## 2019-07-23 DIAGNOSIS — B3731 Acute candidiasis of vulva and vagina: Secondary | ICD-10-CM

## 2019-07-23 LAB — POCT URINALYSIS DIPSTICK
Bilirubin, UA: NEGATIVE
Blood, UA: NEGATIVE
Glucose, UA: NEGATIVE
Ketones, UA: NEGATIVE
Leukocytes, UA: NEGATIVE
Nitrite, UA: NEGATIVE
Protein, UA: NEGATIVE
Spec Grav, UA: 1.015 (ref 1.010–1.025)
Urobilinogen, UA: 0.2 E.U./dL
pH, UA: 6 (ref 5.0–8.0)

## 2019-07-23 MED ORDER — TERCONAZOLE 0.4 % VA CREA: 1.0000 | TOPICAL_CREAM | Freq: Every day | VAGINAL | 0 refills | Status: DC

## 2019-07-23 MED ORDER — TERBINAFINE HCL 250 MG PO TABS: 250.0000 mg | ORAL_TABLET | Freq: Every day | ORAL | 0 refills | Status: AC

## 2019-07-23 MED FILL — TERCONAZOLE 0.4% CREAM: 0.4 | 5 days supply | Qty: 45 | Fill #0

## 2019-07-23 MED FILL — TERBINAFINE HCL 250 MG TAB: 250 | 15 days supply | Qty: 15 | Fill #0

## 2019-07-23 NOTE — Patient Instructions (Addendum)
A few things to remember from today's visit:   Burning with urination - Plan: POC Urinalysis Dipstick  Vaginal discharge - Plan: POC Urinalysis Dipstick, Urine cytology ancillary only  Tinea corporis - Plan: terbinafine (LAMISIL) 250 MG tablet  Vulvovaginitis due to yeast - Plan: terconazole (TERAZOL 7) 0.4 % vaginal cream   Vaginal Yeast Infection, Adult  Vaginal yeast infection is a condition that causes vaginal discharge as well as soreness, swelling, and redness (inflammation) of the vagina. This is a common condition. Some women get this infection frequently. What are the causes? This condition is caused by a change in the normal balance of the yeast (candida) and bacteria that live in the vagina. This change causes an overgrowth of yeast, which causes the inflammation. What increases the risk? The condition is more likely to develop in women who:  Take antibiotic medicines.  Have diabetes.  Take birth control pills.  Are pregnant.  Douche often.  Have a weak body defense system (immune system).  Have been taking steroid medicines for a long time.  Frequently wear tight clothing. What are the signs or symptoms? Symptoms of this condition include:  White, thick, creamy vaginal discharge.  Swelling, itching, redness, and irritation of the vagina. The lips of the vagina (vulva) may be affected as well.  Pain or a burning feeling while urinating.  Pain during sex. How is this diagnosed? This condition is diagnosed based on:  Your medical history.  A physical exam.  A pelvic exam. Your health care provider will examine a sample of your vaginal discharge under a microscope. Your health care provider may send this sample for testing to confirm the diagnosis. How is this treated? This condition is treated with medicine. Medicines may be over-the-counter or prescription. You may be told to use one or more of the following:  Medicine that is taken by mouth  (orally).  Medicine that is applied as a cream (topically).  Medicine that is inserted directly into the vagina (suppository). Follow these instructions at home:  Lifestyle  Do not have sex until your health care provider approves. Tell your sex partner that you have a yeast infection. That person should go to his or her health care provider and ask if they should also be treated.  Do not wear tight clothes, such as pantyhose or tight pants.  Wear breathable cotton underwear. General instructions  Take or apply over-the-counter and prescription medicines only as told by your health care provider.  Eat more yogurt. This may help to keep your yeast infection from returning.  Do not use tampons until your health care provider approves.  Try taking a sitz bath to help with discomfort. This is a warm water bath that is taken while you are sitting down. The water should only come up to your hips and should cover your buttocks. Do this 3-4 times per day or as told by your health care provider.  Do not douche.  If you have diabetes, keep your blood sugar levels under control.  Keep all follow-up visits as told by your health care provider. This is important. Contact a health care provider if:  You have a fever.  Your symptoms go away and then return.  Your symptoms do not get better with treatment.  Your symptoms get worse.  You have new symptoms.  You develop blisters in or around your vagina.  You have blood coming from your vagina and it is not your menstrual period.  You develop pain in your  abdomen. Summary  Vaginal yeast infection is a condition that causes discharge as well as soreness, swelling, and redness (inflammation) of the vagina.  This condition is treated with medicine. Medicines may be over-the-counter or prescription.  Take or apply over-the-counter and prescription medicines only as told by your health care provider.  Do not douche. Do not have sex or  use tampons until your health care provider approves.  Contact a health care provider if your symptoms do not get better with treatment or your symptoms go away and then return. This information is not intended to replace advice given to you by your health care provider. Make sure you discuss any questions you have with your health care provider. Document Revised: 08/25/2018 Document Reviewed: 06/13/2017 Elsevier Patient Education  [employer].  If you need refills please call your pharmacy. Do not use My Chart to request refills or for acute issues that need immediate attention.    Please be sure medication list is accurate. If a new problem present, please set up appointment sooner than planned today.

## 2019-07-23 NOTE — Progress Notes (Signed)
ACUTE VISIT  Chief Complaint  Patient presents with  . Rash    rash getting worse, started two weeks ago  . Vaginal Discharge    started 1 week ago  . Burning with Urination    started 1 week ago   HPI: Ms.Valerie Gilmore is a 49 y.o. female with hx of HTN,anxiety,and depession is here today with above concerns. 2 weeks of mildly pruritic erythematous rash. She denies any new medication, detergent, soap, or body product. No known insect bite or outdoor exposures to plants. No sick contact. No Hx of eczema or similar rash in the past.  A few days before rash was noted she had been wearing a latex training waist band for 8 hours daily,wet when removed. First noted on back and spreading to abdomen.  She was seen for this problem, video visit on 07/12/19, prescribed Triamcinolone cream 0.1%. Treatment is not helping , problem is getting worse.  Negative for oral lesions/edema,cough, wheezing, dyspnea, abdominal pain, nausea, or vomiting.  She is also c/o a week of clear vaginal discharge with no odor. + Vaginal pruritus. No Hx of STD's. Sexually active with a sexual partner. She has not taken OTC treatments.  No associated pelvic pain or vaginal bleeding. + Burning sensation with urination. No other urinary symptoms. Negative for fever,chills, or decreased appetite.  Review of Systems  Constitutional: Negative for activity change and fatigue.  HENT: Negative for congestion and trouble swallowing.   Eyes: Negative for discharge and redness.  Cardiovascular: Negative for chest pain and palpitations.  Genitourinary: Negative for difficulty urinating, flank pain, frequency and hematuria.  Musculoskeletal: Negative for gait problem, joint swelling and myalgias.  Skin: Positive for rash.  Allergic/Immunologic: Positive for environmental allergies.  Neurological: Negative for weakness and headaches.  Psychiatric/Behavioral: Negative for confusion and sleep disturbance.  Rest  see pertinent positives and negatives per HPI.  Current Outpatient Medications on File Prior to Visit  Medication Sig Dispense Refill  . cetirizine (ZYRTEC) 10 MG tablet Take 1 tablet (10 mg total) by mouth daily. 30 tablet 11  . citalopram (CELEXA) 10 MG tablet Take 1 tablet (10 mg total) by mouth at bedtime. 90 tablet 1  . clotrimazole (LOTRIMIN) 1 % cream SMARTSIG:1 Sparingly Topical Twice Daily    . fluticasone (FLONASE) 50 MCG/ACT nasal spray Place 2 sprays into both nostrils daily. 16 g 6  . gabapentin (NEURONTIN) 100 MG capsule TAKE 2 CAPSULES (200 MG TOTAL) BY MOUTH AT BEDTIME. 60 capsule 3  . ibuprofen (IBU) 800 MG tablet Take 1 tablet (800 mg total) by mouth every 8 (eight) hours as needed. 30 tablet 3  . metFORMIN (GLUCOPHAGE) 500 MG tablet TAKE 1/2 TABLET BY MOUTH ONCE A DAY WITH BREAKFAST 45 tablet 0  . Olmesartan-amLODIPine-HCTZ 40-5-25 MG TABS Take 1 tablet by mouth daily. 90 tablet 3  . triamcinolone cream (KENALOG) 0.1 % Apply 1 application topically 2 (two) times daily. 30 g 0  . pantoprazole (PROTONIX) 40 MG tablet Take 1 tablet (40 mg total) by mouth daily. 90 tablet 3   No current facility-administered medications on file prior to visit.     Past Medical History:  Diagnosis Date  . Depression   . Hypertension    high blood pressure readings   . Migraine    Allergies  Allergen Reactions  . Sulfa Antibiotics Other (See Comments)    Hallucinations   . Other Rash    Powder gloves cause a rash    Social History  Socioeconomic History  . Marital status: Single    Spouse name: Not on file  . Number of children: Not on file  . Years of education: Not on file  . Highest education level: Not on file  Occupational History  . Not on file  Tobacco Use  . Smoking status: Never Smoker  . Smokeless tobacco: Never Used  Substance and Sexual Activity  . Alcohol use: No    Alcohol/week: 0.0 standard drinks  . Drug use: No  . Sexual activity: Not on file  Other  Topics Concern  . Not on file  Social History Narrative  . Not on file   Social Determinants of Health   Financial Resource Strain:   . Difficulty of Paying Living Expenses:   Food Insecurity:   . Worried About Programme researcher, broadcasting/film/video in the Last Year:   . Barista in the Last Year:   Transportation Needs:   . Freight forwarder (Medical):   Marland Kitchen Lack of Transportation (Non-Medical):   Physical Activity:   . Days of Exercise per Week:   . Minutes of Exercise per Session:   Stress:   . Feeling of Stress :   Social Connections:   . Frequency of Communication with Friends and Family:   . Frequency of Social Gatherings with Friends and Family:   . Attends Religious Services:   . Active Member of Clubs or Organizations:   . Attends Banker Meetings:   Marland Kitchen Marital Status:     Vitals:   07/23/19 1404  BP: 126/82  Pulse: 79  Resp: 12  Temp: 98.1 F (36.7 C)  SpO2: 96%   Body mass index is 29.62 kg/m.  Physical Exam  Nursing note and vitals reviewed. Constitutional: She is oriented to person, place, and time. She appears well-developed. No distress.  HENT:  Head: Normocephalic and atraumatic.  Eyes: Conjunctivae are normal.  Cardiovascular: Normal rate and regular rhythm.  No murmur heard. Respiratory: Effort normal and breath sounds normal. No respiratory distress.  GI: Soft. There is no abdominal tenderness.  Genitourinary:    Genitourinary Comments: Deferred to next visit if problem does not resolve.   Lymphadenopathy:    She has no cervical adenopathy.  Neurological: She is alert and oriented to person, place, and time.  Skin: Skin is warm. Rash noted.  Rounded erythematous lesions with clear center. + Scaly. Different sizes on upper and lower back, and abdomen, Not tenderness,induration,or local heat. See pictures.  Psychiatric: Her speech is normal.  Well groomed, good eye contact.            ASSESSMENT AND PLAN:  Valerie Gilmore was  seen today for rash, vaginal discharge and burning with urination.  Diagnoses and all orders for this visit:  Tinea corporis We discussed differential Dx's. Side effects of medication discussed. OTC selsun blue daily from neck down and rinse it after 5 min. Instructed about warning sings.  -     terbinafine (LAMISIL) 250 MG tablet; Take 1 tablet (250 mg total) by mouth daily for 15 days.  Burning with urination Urine dipstick otherwise normal.  -     POC Urinalysis Dipstick  Vaginal discharge We discussed possible etiologies. Negative for risks for STD's Hx suggest fungal etiology. Further recommendations according to lab results.  -     POC Urinalysis Dipstick -     Urine cytology ancillary only  Vulvovaginitis due to yeast Vaginal Terazol. If problem does not resolve,instructed to follow with pcp  or gyn.  -     terconazole (TERAZOL 7) 0.4 % vaginal cream; Place 1 applicator vaginally at bedtime.   Return in about 2 weeks (around 08/06/2019) for skin rash with PCP.   Kyrra Prada G. [place], MD  [employer] [facility]. [place] office.  Discharge Instructions   None      A few things to remember from today's visit:   Burning with urination - Plan: POC Urinalysis Dipstick  Vaginal discharge - Plan: POC Urinalysis Dipstick, Urine cytology ancillary only  Tinea corporis - Plan: terbinafine (LAMISIL) 250 MG tablet  Vulvovaginitis due to yeast - Plan: terconazole (TERAZOL 7) 0.4 % vaginal cream   Vaginal Yeast Infection, Adult  Vaginal yeast infection is a condition that causes vaginal discharge as well as soreness, swelling, and redness (inflammation) of the vagina. This is a common condition. Some women get this infection frequently. What are the causes? This condition is caused by a change in the normal balance of the yeast (candida) and bacteria that live in the vagina. This change causes an overgrowth of yeast, which causes the inflammation. What increases the  risk? The condition is more likely to develop in women who:  Take antibiotic medicines.  Have diabetes.  Take birth control pills.  Are pregnant.  Douche often.  Have a weak body defense system (immune system).  Have been taking steroid medicines for a long time.  Frequently wear tight clothing. What are the signs or symptoms? Symptoms of this condition include:  White, thick, creamy vaginal discharge.  Swelling, itching, redness, and irritation of the vagina. The lips of the vagina (vulva) may be affected as well.  Pain or a burning feeling while urinating.  Pain during sex. How is this diagnosed? This condition is diagnosed based on:  Your medical history.  A physical exam.  A pelvic exam. Your health care provider will examine a sample of your vaginal discharge under a microscope. Your health care provider may send this sample for testing to confirm the diagnosis. How is this treated? This condition is treated with medicine. Medicines may be over-the-counter or prescription. You may be told to use one or more of the following:  Medicine that is taken by mouth (orally).  Medicine that is applied as a cream (topically).  Medicine that is inserted directly into the vagina (suppository). Follow these instructions at home:  Lifestyle  Do not have sex until your health care provider approves. Tell your sex partner that you have a yeast infection. That person should go to his or her health care provider and ask if they should also be treated.  Do not wear tight clothes, such as pantyhose or tight pants.  Wear breathable cotton underwear. General instructions  Take or apply over-the-counter and prescription medicines only as told by your health care provider.  Eat more yogurt. This may help to keep your yeast infection from returning.  Do not use tampons until your health care provider approves.  Try taking a sitz bath to help with discomfort. This is a warm  water bath that is taken while you are sitting down. The water should only come up to your hips and should cover your buttocks. Do this 3-4 times per day or as told by your health care provider.  Do not douche.  If you have diabetes, keep your blood sugar levels under control.  Keep all follow-up visits as told by your health care provider. This is important. Contact a health care provider if:  You have  a fever.  Your symptoms go away and then return.  Your symptoms do not get better with treatment.  Your symptoms get worse.  You have new symptoms.  You develop blisters in or around your vagina.  You have blood coming from your vagina and it is not your menstrual period.  You develop pain in your abdomen. Summary  Vaginal yeast infection is a condition that causes discharge as well as soreness, swelling, and redness (inflammation) of the vagina.  This condition is treated with medicine. Medicines may be over-the-counter or prescription.  Take or apply over-the-counter and prescription medicines only as told by your health care provider.  Do not douche. Do not have sex or use tampons until your health care provider approves.  Contact a health care provider if your symptoms do not get better with treatment or your symptoms go away and then return. This information is not intended to replace advice given to you by your health care provider. Make sure you discuss any questions you have with your health care provider. Document Revised: 08/25/2018 Document Reviewed: 06/13/2017 Elsevier Patient Education  [employer].  If you need refills please call your pharmacy. Do not use My Chart to request refills or for acute issues that need immediate attention.    Please be sure medication list is accurate. If a new problem present, please set up appointment sooner than planned today.

## 2019-07-24 LAB — URINE CYTOLOGY ANCILLARY ONLY
Chlamydia: NEGATIVE
Comment: NEGATIVE
Comment: NEGATIVE
Comment: NORMAL
Neisseria Gonorrhea: NEGATIVE
Trichomonas: NEGATIVE

## 2019-08-13 MED FILL — OLMSRTN-AMLDPN-HCTZ 40-5-25: 40-5-25 | 30 days supply | Qty: 30 | Fill #2

## 2019-08-23 DIAGNOSIS — M4807 Spinal stenosis, lumbosacral region: Secondary | ICD-10-CM | POA: Diagnosis not present

## 2019-08-27 ENCOUNTER — Other Ambulatory Visit: Payer: Self-pay | Admitting: Adult Health

## 2019-08-27 DIAGNOSIS — R7303 Prediabetes: Secondary | ICD-10-CM

## 2019-08-29 NOTE — Telephone Encounter (Signed)
Needs a1c follow up

## 2019-08-30 NOTE — Telephone Encounter (Signed)
Tried reaching the pt by telephone.  Received a message that the voicemail box has not been set up.  Will try again at a later time. 

## 2019-08-31 ENCOUNTER — Ambulatory Visit: Payer: 59 | Admitting: Adult Health

## 2019-08-31 ENCOUNTER — Encounter: Payer: Self-pay | Admitting: Adult Health

## 2019-08-31 ENCOUNTER — Ambulatory Visit (INDEPENDENT_AMBULATORY_CARE_PROVIDER_SITE_OTHER)
Admission: RE | Admit: 2019-08-31 | Discharge: 2019-08-31 | Disposition: A | Discharge status: Home / Self Care | Payer: 59 | Source: Ambulatory Visit | Attending: Adult Health | Admitting: Adult Health

## 2019-08-31 ENCOUNTER — Other Ambulatory Visit: Payer: Self-pay

## 2019-08-31 VITALS — BP 114/76 | Temp 98.8°F | Wt 189.0 lb

## 2019-08-31 DIAGNOSIS — R079 Chest pain, unspecified: Secondary | ICD-10-CM

## 2019-08-31 DIAGNOSIS — R1013 Epigastric pain: Secondary | ICD-10-CM | POA: Diagnosis not present

## 2019-08-31 DIAGNOSIS — E7439 Other disorders of intestinal carbohydrate absorption: Secondary | ICD-10-CM

## 2019-08-31 NOTE — Progress Notes (Signed)
Subjective:    Patient ID: Valerie Gilmore, female    DOB: [birth year], 49 y.o.   MRN: [id number]  HPI 49 year old female who  has a past medical history of Depression, Hypertension, and Migraine.  She presents to the office today for follow-up regarding glucose intolerance.  In January 2021 her A1c had increased from 6.0-5.3.  She was placed on Metformin 250 mg daily as a prophylactic measure.  In April her A1c had dropped to 5.8 and she was tolerating Metformin well.  Lab Results  Component Value Date   HGBA1C 6.1 (H) 08/31/2019   Today she reports that she continues to have "tenderness under my left breast".  She has not had her mammogram done yet.  Pain is not constant.  Feels as though it is pressure in chart.  Pain feels as though it is coming from underneath the left breast.   Review of Systems See HPI   Past Medical History:  Diagnosis Date  . Depression   . Hypertension    high blood pressure readings   . Migraine     Social History   Socioeconomic History  . Marital status: Single    Spouse name: Not on file  . Number of children: Not on file  . Years of education: Not on file  . Highest education level: Not on file  Occupational History  . Not on file  Tobacco Use  . Smoking status: Never Smoker  . Smokeless tobacco: Never Used  Substance and Sexual Activity  . Alcohol use: No    Alcohol/week: 0.0 standard drinks  . Drug use: No  . Sexual activity: Not on file  Other Topics Concern  . Not on file  Social History Narrative  . Not on file   Social Determinants of Health   Financial Resource Strain:   . Difficulty of Paying Living Expenses:   Food Insecurity:   . Worried About Programme researcher, broadcasting/film/video in the Last Year:   . Barista in the Last Year:   Transportation Needs:   . Freight forwarder (Medical):   Marland Kitchen Lack of Transportation (Non-Medical):   Physical Activity:   . Days of Exercise per Week:   . Minutes of Exercise per Session:     Stress:   . Feeling of Stress :   Social Connections:   . Frequency of Communication with Friends and Family:   . Frequency of Social Gatherings with Friends and Family:   . Attends Religious Services:   . Active Member of Clubs or Organizations:   . Attends Banker Meetings:   Marland Kitchen Marital Status:   Intimate Partner Violence:   . Fear of Current or Ex-Partner:   . Emotionally Abused:   Marland Kitchen Physically Abused:   . Sexually Abused:     Past Surgical History:  Procedure Laterality Date  . ABDOMINAL HYSTERECTOMY  2011    Family History  Problem Relation Age of Onset  . Breast cancer Paternal Grandmother   . Prostate cancer Father   . Hypertension Other        both sides     Allergies  Allergen Reactions  . Sulfa Antibiotics Other (See Comments)    Hallucinations   . Other Rash    Powder gloves cause a rash    Current Outpatient Medications on File Prior to Visit  Medication Sig Dispense Refill  . cetirizine (ZYRTEC) 10 MG tablet Take 1 tablet (10 mg total) by mouth daily.  30 tablet 11  . citalopram (CELEXA) 10 MG tablet Take 1 tablet (10 mg total) by mouth at bedtime. 90 tablet 1  . clotrimazole (LOTRIMIN) 1 % cream SMARTSIG:1 Sparingly Topical Twice Daily    . fluticasone (FLONASE) 50 MCG/ACT nasal spray Place 2 sprays into both nostrils daily. 16 g 6  . gabapentin (NEURONTIN) 100 MG capsule TAKE 2 CAPSULES (200 MG TOTAL) BY MOUTH AT BEDTIME. 60 capsule 3  . ibuprofen (IBU) 800 MG tablet Take 1 tablet (800 mg total) by mouth every 8 (eight) hours as needed. 30 tablet 3  . metFORMIN (GLUCOPHAGE) 500 MG tablet TAKE 1/2 TABLET BY MOUTH ONCE A DAY WITH BREAKFAST 45 tablet 0  . Olmesartan-amLODIPine-HCTZ 40-5-25 MG TABS Take 1 tablet by mouth daily. 90 tablet 3  . triamcinolone cream (KENALOG) 0.1 % Apply 1 application topically 2 (two) times daily. 30 g 0  . pantoprazole (PROTONIX) 40 MG tablet Take 1 tablet (40 mg total) by mouth daily. 90 tablet 3   No current  facility-administered medications on file prior to visit.    BP 114/76   Temp 98.8 F (37.1 C)   Wt 189 lb (85.7 kg)   BMI 29.60 kg/m       Objective:   Physical Exam Vitals and nursing note reviewed.  Constitutional:      Appearance: Normal appearance.  Cardiovascular:     Rate and Rhythm: Normal rate and regular rhythm.     Pulses: Normal pulses.     Heart sounds: Normal heart sounds.  Pulmonary:     Effort: Pulmonary effort is normal.     Breath sounds: Normal breath sounds.  Musculoskeletal:        General: Tenderness (Tenderness with palpation to sternum.  No pain with palpation under left breast) present.  Neurological:     Mental Status: She is alert.  Psychiatric:        Mood and Affect: Mood normal.        Behavior: Behavior normal.        Thought Content: Thought content normal.        Judgment: Judgment normal.       Assessment & Plan:  1. Glucose intolerance - Consider increase in metformin  - Basic Metabolic Panel; Future - Hemoglobin A1c; Future - Hemoglobin A1c - Basic Metabolic Panel  2. Chest pain, unspecified type - Appears to be skeletal pain.  - Will get xray to look for abnormality  - Advised to get mammogram  - DG Chest 2 View; Future   Shirline Frees, NP

## 2019-09-01 LAB — BASIC METABOLIC PANEL
BUN: 15 mg/dL (ref 7–25)
CO2: 26 mmol/L (ref 20–32)
Calcium: 9.4 mg/dL (ref 8.6–10.2)
Chloride: 104 mmol/L (ref 98–110)
Creat: 0.87 mg/dL (ref 0.50–1.10)
Glucose, Bld: 103 mg/dL — ABNORMAL HIGH (ref 65–99)
Potassium: 3.7 mmol/L (ref 3.5–5.3)
Sodium: 139 mmol/L (ref 135–146)

## 2019-09-01 LAB — HEMOGLOBIN A1C
Hgb A1c MFr Bld: 6.1 % of total Hgb — ABNORMAL HIGH (ref <5.7)
Mean Plasma Glucose: 128 (calc)
eAG (mmol/L): 7.1 (calc)

## 2019-09-04 MED FILL — METFORMIN HCL 500 MG TABS: 500 | 90 days supply | Qty: 45 | Fill #0

## 2019-09-04 NOTE — Telephone Encounter (Signed)
6 

## 2019-09-04 NOTE — Telephone Encounter (Signed)
Sent to the pharmacy by e-scribe. 

## 2019-09-05 ENCOUNTER — Telehealth: Payer: Self-pay | Admitting: Adult Health

## 2019-09-05 MED FILL — PANTOPRAZOLE SOD DR 40 MG T: 40 | 90 days supply | Qty: 90 | Fill #2

## 2019-09-05 NOTE — Telephone Encounter (Signed)
Spoke to the pt.  See result note.

## 2019-09-05 NOTE — Telephone Encounter (Signed)
Pt was returning call. She is in the hospital now. She said to call her back   [phone]  Please advise

## 2019-09-11 MED FILL — OLMSRTN-AMLDPN-HCTZ 40-5-25: 40-5-25 | 30 days supply | Qty: 30 | Fill #3

## 2019-10-19 MED FILL — OLMSRTN-AMLDPN-HCTZ 40-5-25: 40-5-25 | 30 days supply | Qty: 30 | Fill #4

## 2019-12-05 MED FILL — PANTOPRAZOLE SOD DR 40 MG T: 40 | 90 days supply | Qty: 90 | Fill #3

## 2019-12-05 MED FILL — OLMSRTN-AMLDPN-HCTZ 40-5-25: 40-5-25 | 30 days supply | Qty: 30 | Fill #5

## 2019-12-07 DIAGNOSIS — H52223 Regular astigmatism, bilateral: Secondary | ICD-10-CM | POA: Diagnosis not present

## 2019-12-07 DIAGNOSIS — H524 Presbyopia: Secondary | ICD-10-CM | POA: Diagnosis not present

## 2020-01-17 MED FILL — OLMSRTN-AMLDPN-HCTZ 40-5-25: 40-5-25 | 30 days supply | Qty: 30 | Fill #6

## 2020-01-18 ENCOUNTER — Ambulatory Visit: Payer: 59 | Attending: Family

## 2020-01-18 DIAGNOSIS — Z23 Encounter for immunization: Secondary | ICD-10-CM

## 2020-02-18 ENCOUNTER — Other Ambulatory Visit: Payer: Self-pay

## 2020-02-18 ENCOUNTER — Emergency Department (HOSPITAL_COMMUNITY)
Admission: EM | Admit: 2020-02-18 | Discharge: 2020-02-18 | Disposition: A | Discharge status: Home / Self Care | Payer: 59 | Attending: Emergency Medicine | Admitting: Emergency Medicine

## 2020-02-18 ENCOUNTER — Encounter (HOSPITAL_COMMUNITY): Payer: Self-pay | Admitting: Emergency Medicine

## 2020-02-18 DIAGNOSIS — M542 Cervicalgia: Secondary | ICD-10-CM | POA: Insufficient documentation

## 2020-02-18 DIAGNOSIS — M7918 Myalgia, other site: Secondary | ICD-10-CM | POA: Insufficient documentation

## 2020-02-18 DIAGNOSIS — Y92410 Unspecified street and highway as the place of occurrence of the external cause: Secondary | ICD-10-CM | POA: Diagnosis not present

## 2020-02-18 DIAGNOSIS — Z5321 Procedure and treatment not carried out due to patient leaving prior to being seen by health care provider: Secondary | ICD-10-CM | POA: Diagnosis not present
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
## 2020-02-18 NOTE — ED Notes (Signed)
Pt states she is leaving and will be seen at another hospital.

## 2020-02-20 ENCOUNTER — Other Ambulatory Visit: Payer: Self-pay

## 2020-02-20 ENCOUNTER — Ambulatory Visit: Payer: 59 | Admitting: Adult Health

## 2020-02-20 ENCOUNTER — Other Ambulatory Visit (HOSPITAL_COMMUNITY): Payer: Self-pay | Admitting: Surgical

## 2020-02-20 MED FILL — METHYLPREDNISOLONE 4 MG TBP: 4 | 6 days supply | Qty: 21 | Fill #0
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
## 2020-02-21 ENCOUNTER — Other Ambulatory Visit: Payer: Self-pay | Admitting: Adult Health

## 2020-02-21 ENCOUNTER — Encounter: Payer: Self-pay | Admitting: Adult Health

## 2020-02-21 ENCOUNTER — Ambulatory Visit (INDEPENDENT_AMBULATORY_CARE_PROVIDER_SITE_OTHER): Payer: 59

## 2020-02-21 ENCOUNTER — Ambulatory Visit: Payer: 59 | Admitting: Adult Health

## 2020-02-21 VITALS — BP 118/86 | Temp 98.9°F | Wt 185.0 lb

## 2020-02-21 DIAGNOSIS — R519 Headache, unspecified: Secondary | ICD-10-CM

## 2020-02-21 DIAGNOSIS — M25562 Pain in left knee: Secondary | ICD-10-CM | POA: Diagnosis not present

## 2020-02-21 DIAGNOSIS — M549 Dorsalgia, unspecified: Secondary | ICD-10-CM

## 2020-02-21 DIAGNOSIS — I10 Essential (primary) hypertension: Secondary | ICD-10-CM

## 2020-02-21 MED FILL — OLMSRTN-AMLDPN-HCTZ 40-5-25: 40-5-25 | 30 days supply | Qty: 30 | Fill #0
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
## 2020-02-21 NOTE — Telephone Encounter (Signed)
I HAVE SCHEDULED THE PT FOR HER FUTURE CPX APPT.  SENT TO THE PHARMACY BY E-SCRIBE.

## 2020-03-12 ENCOUNTER — Other Ambulatory Visit (HOSPITAL_COMMUNITY): Payer: Self-pay | Admitting: Surgical

## 2020-03-12 MED FILL — BACLOFEN 10 MG TABS: 10 | 10 days supply | Qty: 30 | Fill #0

## 2020-03-12 MED FILL — MELOXICAM 15 MG TABLET: 15 | 30 days supply | Qty: 30 | Fill #0

## 2020-03-13 MED FILL — OLMSRTN-AMLDPN-HCTZ 40-5-25: 40-5-25 | 30 days supply | Qty: 30 | Fill #0

## 2020-03-25 ENCOUNTER — Other Ambulatory Visit: Payer: Self-pay

## 2020-03-26 ENCOUNTER — Other Ambulatory Visit: Payer: Self-pay | Admitting: Family Medicine

## 2020-03-26 ENCOUNTER — Encounter: Payer: Self-pay | Admitting: Adult Health

## 2020-03-26 ENCOUNTER — Ambulatory Visit (INDEPENDENT_AMBULATORY_CARE_PROVIDER_SITE_OTHER): Payer: 59 | Admitting: Adult Health

## 2020-03-26 ENCOUNTER — Other Ambulatory Visit: Payer: Self-pay | Admitting: Adult Health

## 2020-03-26 VITALS — BP 112/80 | Temp 98.7°F | Ht 68.0 in | Wt 187.0 lb

## 2020-03-26 DIAGNOSIS — I10 Essential (primary) hypertension: Secondary | ICD-10-CM | POA: Diagnosis not present

## 2020-03-26 DIAGNOSIS — Z1159 Encounter for screening for other viral diseases: Secondary | ICD-10-CM | POA: Diagnosis not present

## 2020-03-26 DIAGNOSIS — K219 Gastro-esophageal reflux disease without esophagitis: Secondary | ICD-10-CM | POA: Diagnosis not present

## 2020-03-26 DIAGNOSIS — Z0000 Encounter for general adult medical examination without abnormal findings: Secondary | ICD-10-CM | POA: Diagnosis not present

## 2020-03-26 DIAGNOSIS — E7439 Other disorders of intestinal carbohydrate absorption: Secondary | ICD-10-CM

## 2020-03-26 DIAGNOSIS — Z1211 Encounter for screening for malignant neoplasm of colon: Secondary | ICD-10-CM

## 2020-03-26 DIAGNOSIS — R7303 Prediabetes: Secondary | ICD-10-CM

## 2020-03-26 LAB — HEMOGLOBIN A1C: Hgb A1c MFr Bld: 6.3 % (ref 4.6–6.5)

## 2020-03-26 LAB — CBC WITH DIFFERENTIAL/PLATELET
Basophils Absolute: 0 10*3/uL (ref 0.0–0.1)
Basophils Relative: 0.6 % (ref 0.0–3.0)
Eosinophils Absolute: 0.1 10*3/uL (ref 0.0–0.7)
Eosinophils Relative: 1.3 % (ref 0.0–5.0)
HCT: 41.8 % (ref 36.0–46.0)
Hemoglobin: 14.1 g/dL (ref 12.0–15.0)
Lymphocytes Relative: 52.8 % — ABNORMAL HIGH (ref 12.0–46.0)
Lymphs Abs: 2.3 10*3/uL (ref 0.7–4.0)
MCHC: 33.8 g/dL (ref 30.0–36.0)
MCV: 83.1 fl (ref 78.0–100.0)
Monocytes Absolute: 0.4 10*3/uL (ref 0.1–1.0)
Monocytes Relative: 9.9 % (ref 3.0–12.0)
Neutro Abs: 1.5 10*3/uL (ref 1.4–7.7)
Neutrophils Relative %: 35.4 % — ABNORMAL LOW (ref 43.0–77.0)
Platelets: 316 10*3/uL (ref 150.0–400.0)
RBC: 5.03 Mil/uL (ref 3.87–5.11)
RDW: 14.4 % (ref 11.5–15.5)
WBC: 4.4 10*3/uL (ref 4.0–10.5)

## 2020-03-26 LAB — COMPREHENSIVE METABOLIC PANEL
ALT: 19 U/L (ref 0–35)
AST: 15 U/L (ref 0–37)
Albumin: 4.4 g/dL (ref 3.5–5.2)
Alkaline Phosphatase: 37 U/L — ABNORMAL LOW (ref 39–117)
BUN: 16 mg/dL (ref 6–23)
CO2: 28 mEq/L (ref 19–32)
Calcium: 10 mg/dL (ref 8.4–10.5)
Chloride: 103 mEq/L (ref 96–112)
Creatinine, Ser: 0.88 mg/dL (ref 0.40–1.20)
GFR: 78.38 mL/min (ref >60.00)
Glucose, Bld: 116 mg/dL — ABNORMAL HIGH (ref 70–99)
Potassium: 3.8 mEq/L (ref 3.5–5.1)
Sodium: 139 mEq/L (ref 135–145)
Total Bilirubin: 0.4 mg/dL (ref 0.2–1.2)
Total Protein: 7.6 g/dL (ref 6.0–8.3)

## 2020-03-26 LAB — LIPID PANEL
Cholesterol: 166 mg/dL (ref 0–200)
HDL: 41.9 mg/dL (ref >39.00)
LDL Cholesterol: 103 mg/dL — ABNORMAL HIGH (ref 0–99)
NonHDL: 124.24
Total CHOL/HDL Ratio: 4
Triglycerides: 104 mg/dL (ref 0.0–149.0)
VLDL: 20.8 mg/dL (ref 0.0–40.0)

## 2020-03-26 LAB — HEPATITIS C ANTIBODY
Hepatitis C Ab: NONREACTIVE
SIGNAL TO CUT-OFF: 0.01 (ref <1.00)

## 2020-03-26 LAB — TSH: TSH: 1.59 u[IU]/mL (ref 0.35–4.50)

## 2020-03-26 MED ORDER — METFORMIN HCL 500 MG PO TABS: ORAL_TABLET | ORAL | 3 refills | Status: DC

## 2020-03-26 MED ORDER — PANTOPRAZOLE SODIUM 40 MG PO TBEC: 40.0000 mg | DELAYED_RELEASE_TABLET | Freq: Every day | ORAL | 3 refills | Status: DC

## 2020-03-26 MED ORDER — OLMESARTAN-AMLODIPINE-HCTZ 40-5-25 MG PO TABS
1.0000 | ORAL_TABLET | Freq: Every day | ORAL | 3 refills | Status: DC
Start: 2020-03-26 — End: 2020-03-26

## 2020-03-26 MED FILL — METFORMIN HCL 500 MG TABS: 500 | 90 days supply | Qty: 45 | Fill #0

## 2020-03-26 MED FILL — PANTOPRAZOLE SOD DR 40 MG T: 40 | 90 days supply | Qty: 90 | Fill #0

## 2020-03-26 NOTE — Progress Notes (Signed)
Subjective:    Patient ID: Valerie Gilmore, female    DOB: 18-Sep-1970, 50 y.o.   MRN: [id number]  HPI Patient presents for yearly preventative medicine examination. She is a pleasant 50 year old female who  has a past medical history of Depression, Hypertension, and Migraine.  Hypertension -controlled with Tribenzor.  She denies dizziness, lightheadedness, chest pain, or shortness of breath  BP Readings from Last 3 Encounters:  03/26/20 112/80  02/21/20 118/86  08/31/19 114/76   GERD - takes Protonix 40 mg daily.   Glucose Intolerance - was prescribed Metformin I 250 mg in the past but she has not been taking it in multiple months. She has been working on cutting back on carbs and sugars. Does check her BS a few times a week at work and reports readings between 70-112 Lab Results  Component Value Date   HGBA1C 6.1 (H) 08/31/2019    All immunizations and health maintenance protocols were reviewed with the patient and needed orders were placed.  Appropriate screening laboratory values were ordered for the patient including screening of hyperlipidemia, renal function and hepatic function.  Medication reconciliation,  past medical history, social history, problem list and allergies were reviewed in detail with the patient  Goals were established with regard to weight loss, exercise, and  diet in compliance with medications. She is trying to eat healthy and exercise  Wt Readings from Last 3 Encounters:  03/26/20 187 lb (84.8 kg)  02/21/20 185 lb (83.9 kg)  08/31/19 189 lb (85.7 kg)   Does not want to do a colonoscopy today. Due for mammogram and she will schedule this   Review of Systems  Constitutional: Negative.   HENT: Negative.   Eyes: Negative.   Respiratory: Negative.   Cardiovascular: Negative.   Gastrointestinal: Negative.   Endocrine: Negative.   Genitourinary: Negative.   Musculoskeletal: Negative.   Skin: Negative.   Allergic/Immunologic: Negative.    Neurological: Negative.   Hematological: Negative.   Psychiatric/Behavioral: Negative.    Past Medical History:  Diagnosis Date  . Depression   . Hypertension    high blood pressure readings   . Migraine     Social History   Socioeconomic History  . Marital status: Single    Spouse name: Not on file  . Number of children: Not on file  . Years of education: Not on file  . Highest education level: Not on file  Occupational History  . Not on file  Tobacco Use  . Smoking status: Never Smoker  . Smokeless tobacco: Never Used  Substance and Sexual Activity  . Alcohol use: No    Alcohol/week: 0.0 standard drinks  . Drug use: No  . Sexual activity: Not on file  Other Topics Concern  . Not on file  Social History Narrative  . Not on file   Social Determinants of Health   Financial Resource Strain: Not on file  Food Insecurity: Not on file  Transportation Needs: Not on file  Physical Activity: Not on file  Stress: Not on file  Social Connections: Not on file  Intimate Partner Violence: Not on file    Past Surgical History:  Procedure Laterality Date  . ABDOMINAL HYSTERECTOMY  2011    Family History  Problem Relation Age of Onset  . Breast cancer Paternal Grandmother   . Prostate cancer Father   . Hypertension Other        both sides     Allergies  Allergen Reactions  . Sulfa  Antibiotics Other (See Comments)    Hallucinations   . Other Rash    Powder gloves cause a rash    Current Outpatient Medications on File Prior to Visit  Medication Sig Dispense Refill  . baclofen (LIORESAL) 10 MG tablet Take 10 mg by mouth every 8 (eight) hours as needed.    Marland Kitchen ibuprofen (IBU) 800 MG tablet Take 1 tablet (800 mg total) by mouth every 8 (eight) hours as needed. 30 tablet 3  . Olmesartan-amLODIPine-HCTZ 40-5-25 MG TABS TAKE 1 TABLET BY MOUTH DAILY. 30 tablet 1  . triamcinolone cream (KENALOG) 0.1 % Apply 1 application topically 2 (two) times daily. 30 g 0  .  metFORMIN (GLUCOPHAGE) 500 MG tablet TAKE 1/2 TABLET BY MOUTH ONCE A DAY WITH BREAKFAST (Patient not taking: Reported on 03/26/2020) 45 tablet 1  . pantoprazole (PROTONIX) 40 MG tablet Take 1 tablet (40 mg total) by mouth daily. 90 tablet 3   No current facility-administered medications on file prior to visit.    BP 112/80   Temp 98.7 F (37.1 C)   Ht 5\' 8"  (1.727 m) Comment: WITH SHOES  Wt 187 lb (84.8 kg)   BMI 28.43 kg/m       Objective:   Physical Exam Vitals and nursing note reviewed.  Constitutional:      General: She is not in acute distress.    Appearance: Normal appearance. She is well-developed. She is not ill-appearing.  HENT:     Head: Normocephalic and atraumatic.     Right Ear: Tympanic membrane, ear canal and external ear normal. There is no impacted cerumen.     Left Ear: Tympanic membrane, ear canal and external ear normal. There is no impacted cerumen.     Nose: Nose normal. No congestion or rhinorrhea.     Mouth/Throat:     Mouth: Mucous membranes are moist.     Pharynx: Oropharynx is clear. No oropharyngeal exudate or posterior oropharyngeal erythema.  Eyes:     General:        Right eye: No discharge.        Left eye: No discharge.     Extraocular Movements: Extraocular movements intact.     Conjunctiva/sclera: Conjunctivae normal.     Pupils: Pupils are equal, round, and reactive to light.  Neck:     Thyroid: No thyromegaly.     Vascular: No carotid bruit.     Trachea: No tracheal deviation.  Cardiovascular:     Rate and Rhythm: Normal rate and regular rhythm.     Pulses: Normal pulses.     Heart sounds: Normal heart sounds. No murmur heard. No friction rub. No gallop.   Pulmonary:     Effort: Pulmonary effort is normal. No respiratory distress.     Breath sounds: Normal breath sounds. No stridor. No wheezing, rhonchi or rales.  Chest:     Chest wall: No tenderness.  Abdominal:     General: Abdomen is flat. Bowel sounds are normal. There is no  distension.     Palpations: Abdomen is soft. There is no mass.     Tenderness: There is no abdominal tenderness. There is no right CVA tenderness, left CVA tenderness, guarding or rebound.     Hernia: No hernia is present.  Musculoskeletal:        General: No swelling, tenderness, deformity or signs of injury. Normal range of motion.     Cervical back: Normal range of motion and neck supple.     Right lower  leg: No edema.     Left lower leg: No edema.  Lymphadenopathy:     Cervical: No cervical adenopathy.  Skin:    General: Skin is warm and dry.     Coloration: Skin is not jaundiced or pale.     Findings: No bruising, erythema, lesion or rash.  Neurological:     General: No focal deficit present.     Mental Status: She is alert and oriented to person, place, and time.     Cranial Nerves: No cranial nerve deficit.     Sensory: No sensory deficit.     Motor: No weakness.     Coordination: Coordination normal.     Gait: Gait normal.     Deep Tendon Reflexes: Reflexes normal.  Psychiatric:        Mood and Affect: Mood normal.        Behavior: Behavior normal.        Thought Content: Thought content normal.        Judgment: Judgment normal.       Assessment & Plan:  1. Routine general medical examination at a health care facility - Continue heart healthy diet and exercise - Follow up in one year or sooner if needed - CBC with Differential/Platelet; Future - Comprehensive metabolic panel; Future - Hemoglobin A1c; Future - Lipid panel; Future - TSH; Future - MM DIGITAL SCREENING BILATERAL; Future  2. Glucose intolerance - Consider adding back Metformin  - CBC with Differential/Platelet; Future - Comprehensive metabolic panel; Future - Hemoglobin A1c; Future - Lipid panel; Future - TSH; Future  3. Essential hypertension - well controlled.  - CBC with Differential/Platelet; Future - Comprehensive metabolic panel; Future - Hemoglobin A1c; Future - Lipid panel; Future -  TSH; Future - Olmesartan-amLODIPine-HCTZ 40-5-25 MG TABS; Take 1 tablet by mouth daily.  Dispense: 90 tablet; Refill: 3  4. Gastroesophageal reflux disease without esophagitis - Continue with Protonix  - CBC with Differential/Platelet; Future - Comprehensive metabolic panel; Future - Hemoglobin A1c; Future - Lipid panel; Future - TSH; Future - pantoprazole (PROTONIX) 40 MG tablet; Take 1 tablet (40 mg total) by mouth daily.  Dispense: 90 tablet; Refill: 3  5. Colon cancer screening - refused.   6. Need for hepatitis C screening test  - Hep C Antibody; Future  Shirline Frees, NP

## 2020-04-09 MED FILL — METFORMIN HCL 500 MG TABS: 500 | 90 days supply | Qty: 45 | Fill #0

## 2020-04-09 MED FILL — OLMSRTN-AMLDPN-HCTZ 40-5-25: 40-5-25 | 90 days supply | Qty: 90 | Fill #0

## 2020-05-07 ENCOUNTER — Other Ambulatory Visit (HOSPITAL_COMMUNITY): Payer: Self-pay | Admitting: Obstetrics

## 2020-05-09 MED FILL — FLUCONAZOLE 150 MG TABS: 150 | 1 days supply | Qty: 1 | Fill #0

## 2020-05-22 NOTE — Progress Notes (Signed)
   Covid-19 Vaccination Clinic  Name:  Valerie Gilmore    MRN: [id number] DOB: [birth year]  05/22/2020  [name] was observed post Covid-19 immunization for 15 minutes without incident. She was provided with Vaccine Information Sheet and instruction to access the V-Safe system.   [name] was instructed to call 911 with any severe reactions post vaccine: Marland Kitchen Difficulty breathing  . Swelling of face and throat  . A fast heartbeat  . A bad rash all over body  . Dizziness and weakness   Immunizations Administered    Name Date Dose VIS Date Route   Pfizer COVID-19 Vaccine 01/18/2020  9:00 AM 0.3 mL 11/28/2019 Intramuscular   Manufacturer: [employer], [employer]   Lot: [id number]   NDC: [address]-0

## 2020-07-21 ENCOUNTER — Other Ambulatory Visit (HOSPITAL_COMMUNITY): Payer: Self-pay

## 2020-07-21 MED FILL — Olmesartan-Amlodipine-Hydrochlorothiazide Tab 40-5-25 MG: ORAL | 90 days supply | Qty: 90 | Fill #0 | Status: AC

## 2020-07-21 MED FILL — Pantoprazole Sodium EC Tab 40 MG (Base Equiv): ORAL | 90 days supply | Qty: 90 | Fill #0 | Status: AC

## 2020-07-22 ENCOUNTER — Other Ambulatory Visit (HOSPITAL_COMMUNITY): Payer: Self-pay

## 2020-07-24 ENCOUNTER — Encounter: Payer: Self-pay | Admitting: Neurology

## 2020-07-24 ENCOUNTER — Other Ambulatory Visit (HOSPITAL_COMMUNITY): Payer: Self-pay

## 2020-08-12 IMAGING — CR DG CHEST 2V
2 series · 2 of 2 positions shown · non-contrast
Comparison: September 09, 2006

CLINICAL DATA: Left-sided chest pain and shoulder pain.

EXAM:
CHEST - 2 VIEW

[w chest pa]
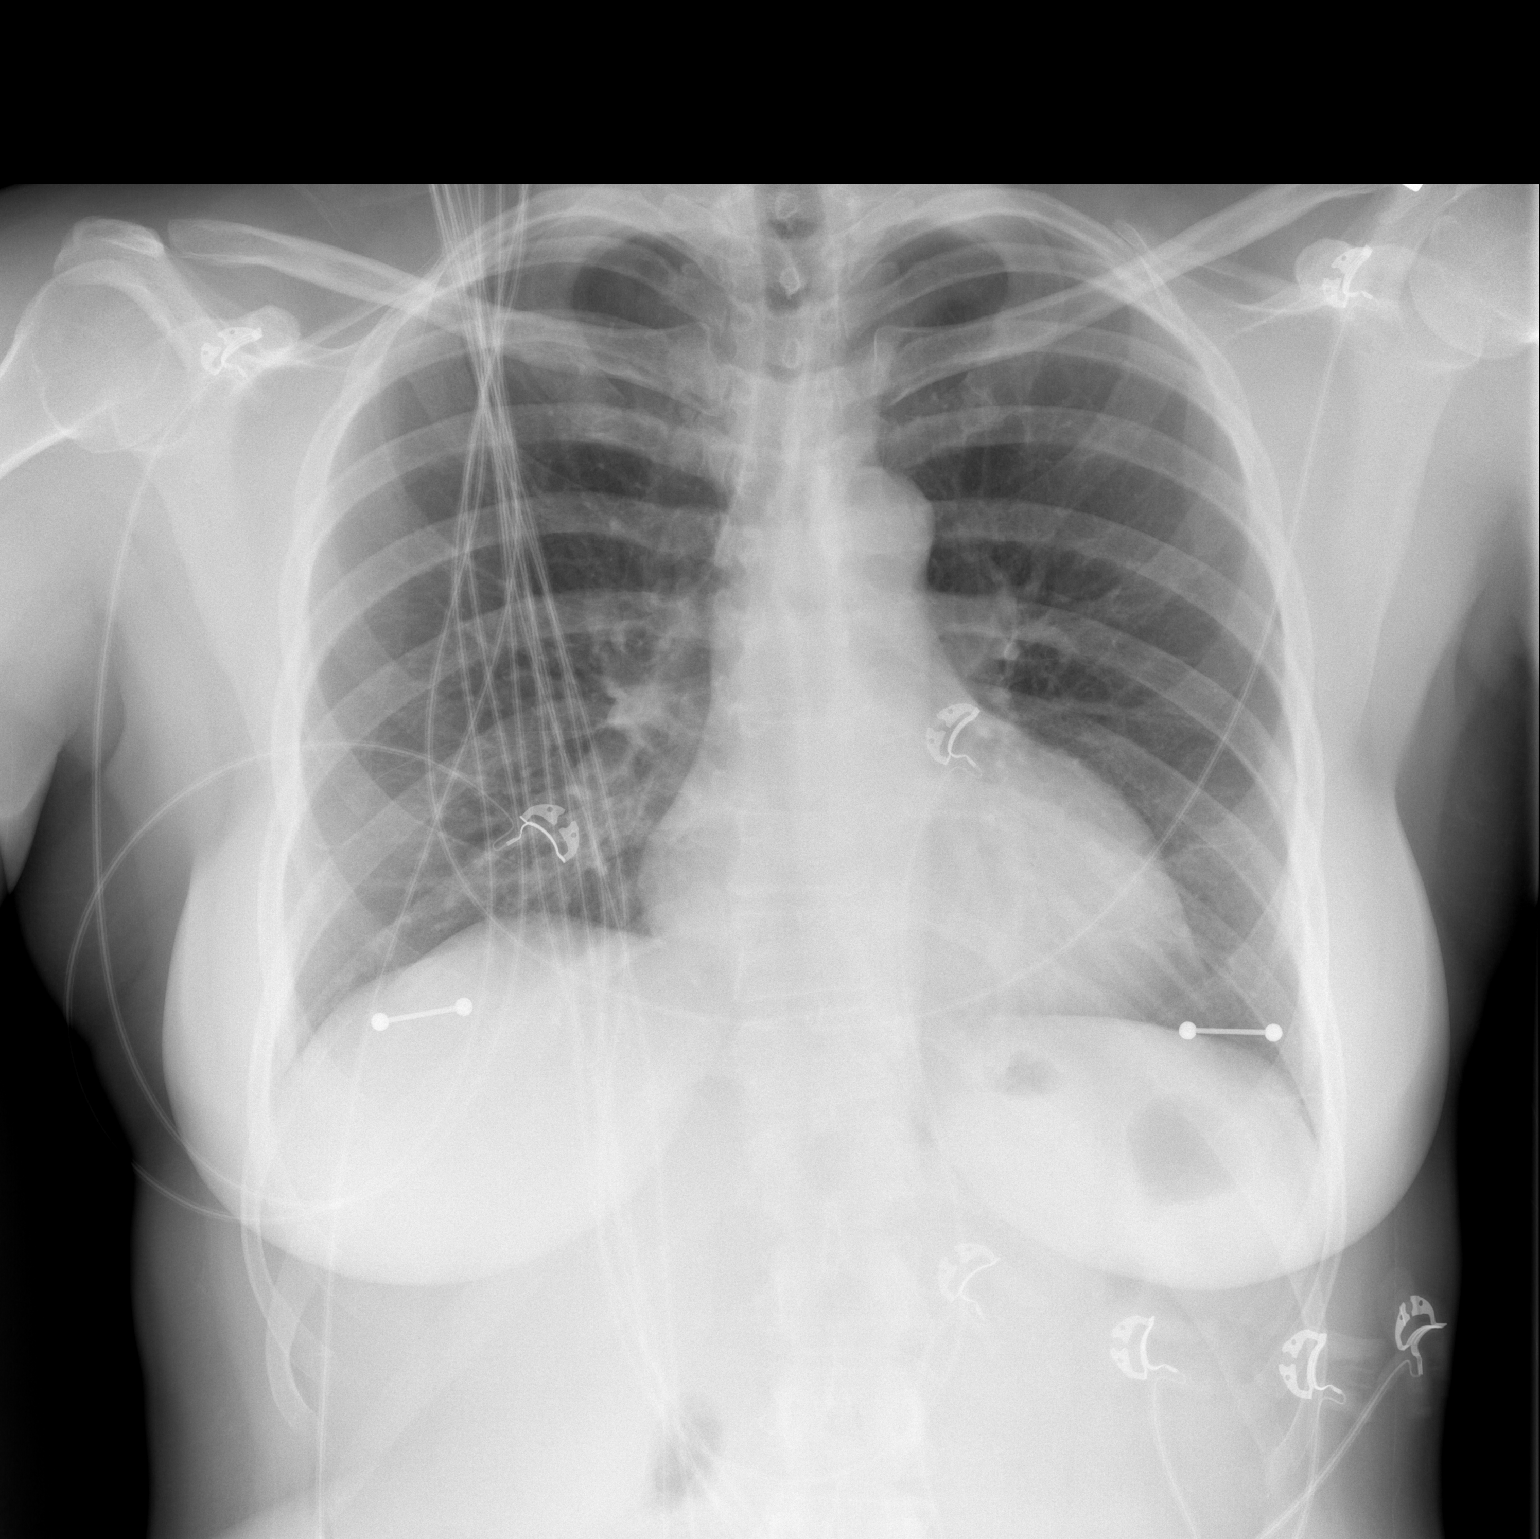

[w chest lat]
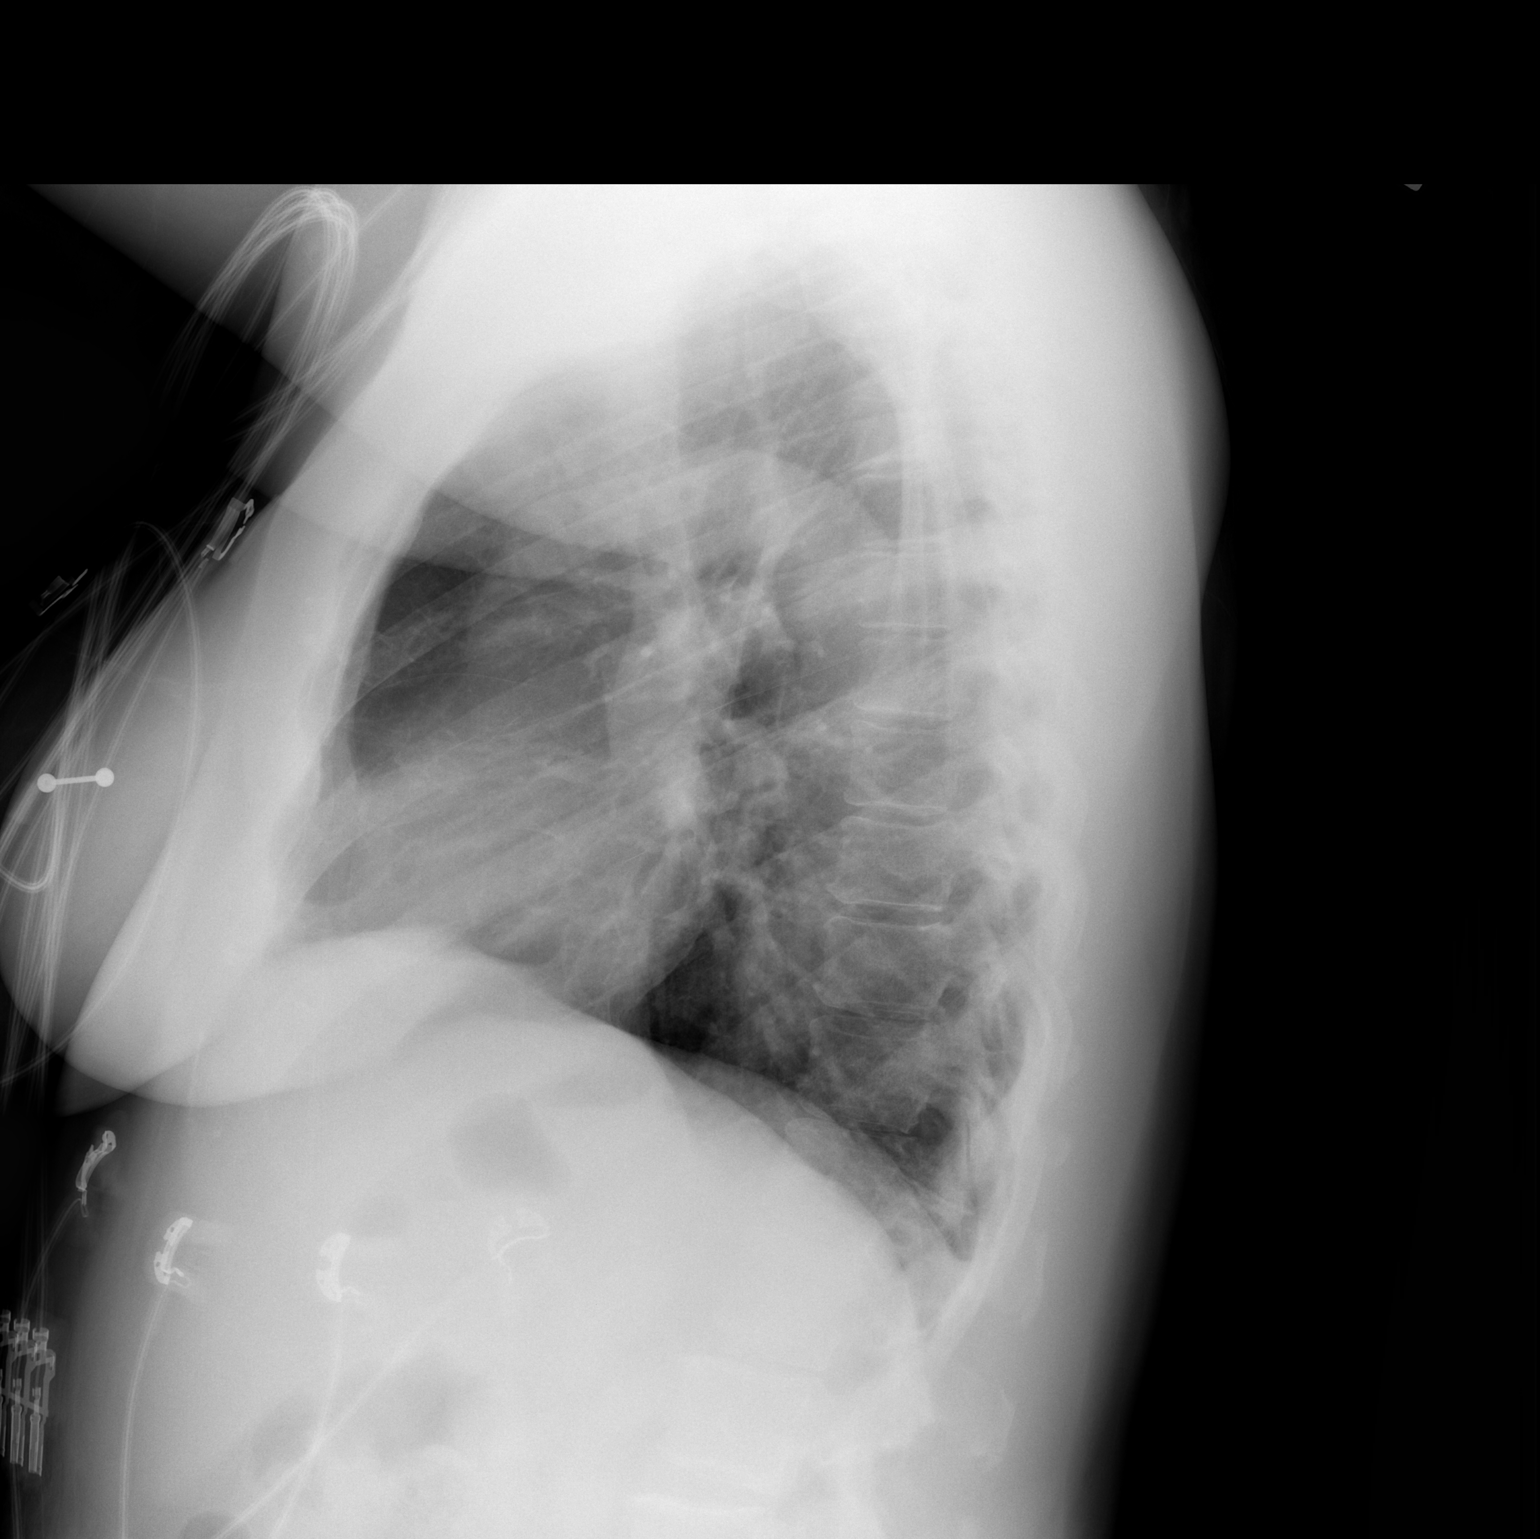

[2 of 2 positions shown; findings below may reference images not displayed]

FINDINGS: The heart size and mediastinal contours are within normal limits.
Both lungs are clear. Bilateral radiopaque nipple piercings are
seen. The visualized skeletal structures are unremarkable.
IMPRESSION: No active cardiopulmonary disease.

## 2020-09-16 ENCOUNTER — Other Ambulatory Visit (HOSPITAL_COMMUNITY): Payer: Self-pay

## 2020-09-16 ENCOUNTER — Other Ambulatory Visit: Payer: Self-pay | Admitting: Adult Health

## 2020-09-16 MED FILL — Olmesartan-Amlodipine-Hydrochlorothiazide Tab 40-5-25 MG: ORAL | 30 days supply | Qty: 30 | Fill #1 | Status: CN

## 2020-09-16 MED FILL — Metformin HCl Tab 500 MG: ORAL | 30 days supply | Qty: 15 | Fill #0 | Status: AC

## 2020-09-17 ENCOUNTER — Other Ambulatory Visit (HOSPITAL_COMMUNITY): Payer: Self-pay

## 2020-09-17 MED ORDER — IBUPROFEN 800 MG PO TABS
800.0000 mg | ORAL_TABLET | Freq: Three times a day (TID) | ORAL | 3 refills | Status: DC | PRN
  Filled 2020-09-17: qty 30, 10d supply, fill #0
  Filled 2020-12-04: qty 30, 10d supply, fill #1
  Filled 2021-01-22: qty 30, 10d supply, fill #2
  Filled 2021-03-31: qty 30, 10d supply, fill #3

## 2020-09-26 ENCOUNTER — Other Ambulatory Visit (HOSPITAL_COMMUNITY): Payer: Self-pay

## 2020-09-29 ENCOUNTER — Other Ambulatory Visit (HOSPITAL_COMMUNITY): Payer: Self-pay

## 2020-10-08 ENCOUNTER — Other Ambulatory Visit (HOSPITAL_COMMUNITY): Payer: Self-pay
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
## 2020-10-15 ENCOUNTER — Encounter: Payer: Self-pay | Admitting: Neurology

## 2020-10-15 ENCOUNTER — Other Ambulatory Visit: Payer: Self-pay

## 2020-10-15 ENCOUNTER — Ambulatory Visit (INDEPENDENT_AMBULATORY_CARE_PROVIDER_SITE_OTHER): Payer: 59 | Admitting: Neurology

## 2020-10-15 ENCOUNTER — Other Ambulatory Visit (HOSPITAL_COMMUNITY): Payer: Self-pay

## 2020-10-15 VITALS — BP 138/85 | HR 71 | Ht 67.0 in | Wt 195.6 lb

## 2020-10-15 DIAGNOSIS — M542 Cervicalgia: Secondary | ICD-10-CM

## 2020-10-15 DIAGNOSIS — G44329 Chronic post-traumatic headache, not intractable: Secondary | ICD-10-CM | POA: Diagnosis not present

## 2020-10-15 DIAGNOSIS — M5442 Lumbago with sciatica, left side: Secondary | ICD-10-CM

## 2020-10-15 MED ORDER — NORTRIPTYLINE HCL 25 MG PO CAPS
25.0000 mg | ORAL_CAPSULE | Freq: Every day | ORAL | 5 refills | Status: DC
  Filled 2020-10-15: qty 30, 30d supply, fill #0

## 2020-10-15 NOTE — Patient Instructions (Signed)
Start nortriptyline 25mg  at bedtime.  If no improvement in headaches in 4 weeks, contact me and we can increase dose.  Limit use of pain relievers to no more than 2 days out of week to prevent risk of rebound or medication-overuse headache. Refer to Dr. for lumbar radiculopathy Follow up 6 months.

## 2020-10-16 NOTE — Progress Notes (Deleted)
[facility] [address] [place] [address] Phone: [phone] Subjective:    I'm seeing this patient by the request  of:  Shirline Frees, NP  CC:   [id number]  Last seen in 2020 Patient responded well to the injection and had near complete resolution of pain almost immediately after the injection.  Patient does have significant dilation of the median nerve and will need to consider the possibility of a surgical intervention at some point.  Patient at this point would like to avoid it.  Patient will increase activity slowly over the course the next several weeks.  Follow-up with me again in 4 to 8 weeks  Updated 10/20/2020 Valerie Gilmore is a 50 y.o. female coming in with complaint of ***  Onset-  Location Duration-  Character- Aggravating factors- Reliving factors-  Therapies tried-  Severity-     Past Medical History:  Diagnosis Date   Depression    Hypertension    high blood pressure readings    Migraine    Past Surgical History:  Procedure Laterality Date   ABDOMINAL HYSTERECTOMY  2011   Social History   Socioeconomic History   Marital status: Single    Spouse name: Not on file   Number of children: Not on file   Years of education: Not on file   Highest education level: Not on file  Occupational History   Not on file  Tobacco Use   Smoking status: Never   Smokeless tobacco: Never  Substance and Sexual Activity   Alcohol use: Yes    Comment: rare   Drug use: No   Sexual activity: Not on file  Other Topics Concern   Not on file  Social History Narrative   Right handed   Social Determinants of Health   Financial Resource Strain: Not on file  Food Insecurity: Not on file  Transportation Needs: Not on file  Physical Activity: Not on file  Stress: Not on file  Social Connections: Not on file   Allergies  Allergen Reactions   Sulfa Antibiotics Other (See Comments)    Hallucinations    Other Rash     Powder gloves cause a rash   Family History  Problem Relation Age of Onset   Breast cancer Paternal Grandmother    Prostate cancer Father    Hypertension Other        both sides     Current Outpatient Medications (Endocrine & Metabolic):    metFORMIN (GLUCOPHAGE) 500 MG tablet, TAKE 1/2 TABLET BY MOUTH ONCE A DAY WITH BREAKFAST  Current Outpatient Medications (Cardiovascular):    Olmesartan-amLODIPine-HCTZ 40-5-25 MG TABS, TAKE 1 TABLET BY MOUTH ONCE A DAY   Current Outpatient Medications (Analgesics):    ibuprofen (IBU) 800 MG tablet, Take 1 tablet (800 mg total) by mouth every 8 (eight) hours as needed.   meloxicam (MOBIC) 15 MG tablet, TAKE 1 TABLET BY MOUTH ONCE A DAY FOR 2 WEEKS THEN USE AS NEEDED AFTERWARDS   Current Outpatient Medications (Other):    baclofen (LIORESAL) 10 MG tablet, Take 10 mg by mouth every 8 (eight) hours as needed. (Patient not taking: Reported on 10/15/2020)   baclofen (LIORESAL) 10 MG tablet, TAKE 1 TABLET BY MOUTH EVERY 8 HOURS AS NEEDED (Patient not taking: Reported on 10/15/2020)   fluconazole (DIFLUCAN) 150 MG tablet, TAKE 1 TABLET BY MOUTH BY MOUTH AS NEEDED   nortriptyline (PAMELOR) 25 MG capsule, Take 1 capsule (25 mg total) by mouth at bedtime.  pantoprazole (PROTONIX) 40 MG tablet, TAKE 1 TABLET BY MOUTH ONCE A DAY   triamcinolone cream (KENALOG) 0.1 %, Apply 1 application topically 2 (two) times daily.   Reviewed prior external information including notes and imaging from  primary care provider As well as notes that were available from care everywhere and other healthcare systems.  Past medical history, social, surgical and family history all reviewed in electronic medical record.  No pertanent information unless stated regarding to the chief complaint.   Review of Systems:  No headache, visual changes, nausea, vomiting, diarrhea, constipation, dizziness, abdominal pain, skin rash, fevers, chills, night sweats, weight loss, swollen lymph  nodes, body aches, joint swelling, chest pain, shortness of breath, mood changes. POSITIVE muscle aches  Objective  There were no vitals taken for this visit.   General: No apparent distress alert and oriented x3 mood and affect normal, dressed appropriately.  HEENT: Pupils equal, extraocular movements intact  Respiratory: Patient's speak in full sentences and does not appear short of breath  Cardiovascular: No lower extremity edema, non tender, no erythema  Gait normal with good balance and coordination.  MSK:  Non tender with full range of motion and good stability and symmetric strength and tone of shoulders, elbows, wrist, hip, knee and ankles bilaterally.     Impression and Recommendations:     The above documentation has been reviewed and is accurate and complete Doristine Bosworth

## 2020-10-18 ENCOUNTER — Other Ambulatory Visit (HOSPITAL_COMMUNITY): Payer: Self-pay

## 2020-10-18 MED FILL — Olmesartan-Amlodipine-Hydrochlorothiazide Tab 40-5-25 MG: ORAL | 30 days supply | Qty: 30 | Fill #1 | Status: AC

## 2020-10-18 MED FILL — Metformin HCl Tab 500 MG: ORAL | 30 days supply | Qty: 15 | Fill #1 | Status: AC

## 2020-10-20 ENCOUNTER — Ambulatory Visit: Payer: 59 | Admitting: Family Medicine

## 2020-10-30 ENCOUNTER — Other Ambulatory Visit: Payer: Self-pay

## 2020-10-30 ENCOUNTER — Ambulatory Visit (INDEPENDENT_AMBULATORY_CARE_PROVIDER_SITE_OTHER): Payer: 59 | Admitting: Sports Medicine

## 2020-10-30 ENCOUNTER — Other Ambulatory Visit (HOSPITAL_COMMUNITY): Payer: Self-pay

## 2020-10-30 VITALS — BP 130/84 | HR 69 | Ht 67.0 in | Wt 190.0 lb

## 2020-10-30 DIAGNOSIS — G5603 Carpal tunnel syndrome, bilateral upper limbs: Secondary | ICD-10-CM | POA: Diagnosis not present

## 2020-10-30 DIAGNOSIS — M5137 Other intervertebral disc degeneration, lumbosacral region: Secondary | ICD-10-CM

## 2020-10-30 DIAGNOSIS — M5432 Sciatica, left side: Secondary | ICD-10-CM

## 2020-10-30 MED ORDER — PREDNISONE 5 MG (48) PO TBPK
ORAL_TABLET | ORAL | 0 refills | Status: DC
  Filled 2020-10-30 – 2020-11-07 (×2): qty 48, 12d supply, fill #0
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
## 2020-10-30 NOTE — Patient Instructions (Addendum)
Good to see you  Start 12-day prednisone Dosepak Start sciatica rehab exercises provided below.  Try and do them at least once a day See me again in around 4 weeks when you are ready for your carpal tunnel injections   Sciatica Rehab Ask your health care provider which exercises are safe for you. Do exercises exactly as told by your health care provider and adjust them as directed. It is normal to feel mild stretching, pulling, tightness, or discomfort as you do these exercises. Stop right away if you feel sudden pain or your pain gets worse. Do not begin these exercises until told by your health care provider. Stretching and range-of-motion exercises These exercises warm up your muscles and joints and improve the movement and flexibility of your hips and back. These exercises also help to relieve pain, numbness, and tingling. Sciatic nerve glide Sit in a chair with your head facing down toward your chest. Place your hands behind your back. Let your shoulders slump forward. Slowly straighten one of your legs while you tilt your head back as if you are looking toward the ceiling. Only straighten your leg as far as you can without making your symptoms worse. Hold this position for __________ seconds. Slowly return to the starting position. Repeat with your other leg. Repeat __________ times. Complete this exercise __________ times a day. Knee to chest with hip adduction and internal rotation  Lie on your back on a firm surface with both legs straight. Bend one of your knees and move it up toward your chest until you feel a gentle stretch in your lower back and buttock. Then, move your knee toward the shoulder that is on the opposite side from your leg. This is hip adduction and internal rotation. Hold your leg in this position by holding on to the front of your knee. Hold this position for __________ seconds. Slowly return to the starting position. Repeat with your other leg. Repeat __________  times. Complete this exercise __________ times a day. Prone extension on elbows  Lie on your abdomen on a firm surface. A bed may be too soft for this exercise. Prop yourself up on your elbows. Use your arms to help lift your chest up until you feel a gentle stretch in your abdomen and your lower back. This will place some of your body weight on your elbows. If this is uncomfortable, try stacking pillows under your chest. Your hips should stay down, against the surface that you are lying on. Keep your hip and back muscles relaxed. Hold this position for __________ seconds. Slowly relax your upper body and return to the starting position. Repeat __________ times. Complete this exercise __________ times a day. Strengthening exercises These exercises build strength and endurance in your back. Endurance is the ability to use your muscles for a long time, even after they get tired. Pelvic tilt This exercise strengthens the muscles that lie deep in the abdomen. Lie on your back on a firm surface. Bend your knees and keep your feet flat on the floor. Tense your abdominal muscles. Tip your pelvis up toward the ceiling and flatten your lower back into the floor. To help with this exercise, you may place a small towel under your lower back and try to push your back into the towel. Hold this position for __________ seconds. Let your muscles relax completely before you repeat this exercise. Repeat __________ times. Complete this exercise __________ times a day. Alternating arm and leg raises  Get on your  hands and knees on a firm surface. If you are on a hard floor, you may want to use padding, such as an exercise mat, to cushion your knees. Line up your arms and legs. Your hands should be directly below your shoulders, and your knees should be directly below your hips. Lift your left leg behind you. At the same time, raise your right arm and straighten it in front of you. Do not lift your leg higher  than your hip. Do not lift your arm higher than your shoulder. Keep your abdominal and back muscles tight. Keep your hips facing the ground. Do not arch your back. Keep your balance carefully, and do not hold your breath. Hold this position for __________ seconds. Slowly return to the starting position. Repeat with your right leg and your left arm. Repeat __________ times. Complete this exercise __________ times a day. Posture and body mechanics Good posture and healthy body mechanics can help to relieve stress in your body's tissues and joints. Body mechanics refers to the movements and positions of your body while you do your daily activities. Posture is part of body mechanics. Good posture means: Your spine is in its natural S-curve position (neutral). Your shoulders are pulled back slightly. Your head is not tipped forward. Follow these guidelines to improve your posture and body mechanics in your everyday activities. Standing  When standing, keep your spine neutral and your feet about hip width apart. Keep a slight bend in your knees. Your ears, shoulders, and hips should line up. When you do a task in which you stand in one place for a long time, place one foot up on a stable object that is 2-4 inches (5-10 cm) high, such as a footstool. This helps keep your spine neutral. Sitting  When sitting, keep your spine neutral and keep your feet flat on the floor. Use a footrest, if necessary, and keep your thighs parallel to the floor. Avoid rounding your shoulders, and avoid tilting your head forward. When working at a desk or a computer, keep your desk at a height where your hands are slightly lower than your elbows. Slide your chair under your desk so you are close enough to maintain good posture. When working at a computer, place your monitor at a height where you are looking straight ahead and you do not have to tilt your head forward or downward to look at the screen. Resting When  lying down and resting, avoid positions that are most painful for you. If you have pain with activities such as sitting, bending, stooping, or squatting, lie in a position in which your body does not bend very much. For example, avoid curling up on your side with your arms and knees near your chest (fetal position). If you have pain with activities such as standing for a long time or reaching with your arms, lie with your spine in a neutral position and bend your knees slightly. Try the following positions: Lying on your side with a pillow between your knees. Lying on your back with a pillow under your knees. Lifting  When lifting objects, keep your feet at least shoulder width apart and tighten your abdominal muscles. Bend your knees and hips and keep your spine neutral. It is important to lift using the strength of your legs, not your back. Do not lock your knees straight out. Always ask for help to lift heavy or awkward objects. This information is not intended to replace advice given to you by  your health care provider. Make sure you discuss any questions you have with your health care provider. Document Revised: 05/19/2018 Document Reviewed: 02/16/2018 Elsevier Patient Education  2022 [employer].

## 2020-11-07 ENCOUNTER — Other Ambulatory Visit (HOSPITAL_COMMUNITY): Payer: Self-pay

## 2020-11-20 ENCOUNTER — Other Ambulatory Visit (HOSPITAL_COMMUNITY): Payer: Self-pay

## 2020-11-20 MED FILL — Olmesartan-Amlodipine-Hydrochlorothiazide Tab 40-5-25 MG: ORAL | 30 days supply | Qty: 30 | Fill #2 | Status: AC

## 2020-11-21 ENCOUNTER — Other Ambulatory Visit (HOSPITAL_COMMUNITY): Payer: Self-pay

## 2020-11-24 ENCOUNTER — Other Ambulatory Visit (HOSPITAL_COMMUNITY): Payer: Self-pay

## 2020-12-04 ENCOUNTER — Other Ambulatory Visit (HOSPITAL_COMMUNITY): Payer: Self-pay
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
## 2020-12-11 ENCOUNTER — Ambulatory Visit: Payer: BC Managed Care – PPO | Admitting: Sports Medicine

## 2020-12-11 ENCOUNTER — Ambulatory Visit: Payer: Self-pay

## 2020-12-11 ENCOUNTER — Other Ambulatory Visit: Payer: Self-pay

## 2020-12-11 VITALS — BP 142/94 | HR 73 | Ht 67.0 in | Wt 195.0 lb

## 2020-12-11 DIAGNOSIS — M5137 Other intervertebral disc degeneration, lumbosacral region: Secondary | ICD-10-CM | POA: Diagnosis not present

## 2020-12-11 DIAGNOSIS — M5432 Sciatica, left side: Secondary | ICD-10-CM

## 2020-12-11 DIAGNOSIS — G5603 Carpal tunnel syndrome, bilateral upper limbs: Secondary | ICD-10-CM

## 2020-12-11 MED ORDER — MELOXICAM 15 MG PO TABS: 15.0000 mg | ORAL_TABLET | Freq: Every day | ORAL | 0 refills | Status: DC

## 2020-12-11 MED ORDER — CYCLOBENZAPRINE HCL 5 MG PO TABS: 5.0000 mg | ORAL_TABLET | Freq: Every day | ORAL | 0 refills | Status: DC

## 2020-12-11 NOTE — Patient Instructions (Addendum)
Injection today Meloxicam 15mg  daily for 2 weeks, use the rest as needed Flexeril 5mg  as needed for back 15 pills Follow up 3 weeks

## 2020-12-29 ENCOUNTER — Other Ambulatory Visit (HOSPITAL_COMMUNITY): Payer: Self-pay

## 2020-12-29 MED FILL — Olmesartan-Amlodipine-Hydrochlorothiazide Tab 40-5-25 MG: ORAL | 30 days supply | Qty: 30 | Fill #3 | Status: AC

## 2020-12-30 ENCOUNTER — Other Ambulatory Visit (HOSPITAL_COMMUNITY): Payer: Self-pay
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
## 2020-12-31 ENCOUNTER — Other Ambulatory Visit: Payer: Self-pay

## 2020-12-31 ENCOUNTER — Ambulatory Visit: Payer: Self-pay

## 2020-12-31 ENCOUNTER — Ambulatory Visit: Payer: BC Managed Care – PPO | Admitting: Sports Medicine

## 2020-12-31 VITALS — BP 140/88 | HR 69 | Ht 67.0 in | Wt 197.0 lb

## 2020-12-31 DIAGNOSIS — G5603 Carpal tunnel syndrome, bilateral upper limbs: Secondary | ICD-10-CM

## 2020-12-31 DIAGNOSIS — M5432 Sciatica, left side: Secondary | ICD-10-CM

## 2020-12-31 DIAGNOSIS — M5137 Other intervertebral disc degeneration, lumbosacral region: Secondary | ICD-10-CM | POA: Diagnosis not present

## 2020-12-31 MED ORDER — CYCLOBENZAPRINE HCL 5 MG PO TABS: 5.0000 mg | ORAL_TABLET | Freq: Every day | ORAL | 0 refills | Status: DC

## 2020-12-31 MED ORDER — MELOXICAM 15 MG PO TABS: 15.0000 mg | ORAL_TABLET | Freq: Every day | ORAL | 0 refills | Status: DC

## 2020-12-31 NOTE — Patient Instructions (Signed)
Do prescribed exercises at least 3x a week As needed follow up 

## 2021-01-09 ENCOUNTER — Ambulatory Visit: Payer: BC Managed Care – PPO | Admitting: Family Medicine

## 2021-01-09 ENCOUNTER — Other Ambulatory Visit (HOSPITAL_COMMUNITY): Payer: Self-pay

## 2021-01-09 ENCOUNTER — Encounter: Payer: Self-pay | Admitting: Family Medicine

## 2021-01-09 ENCOUNTER — Telehealth: Payer: Self-pay

## 2021-01-09 VITALS — BP 158/102 | HR 74 | Temp 98.9°F | Wt 195.8 lb

## 2021-01-09 DIAGNOSIS — R600 Localized edema: Secondary | ICD-10-CM

## 2021-01-09 DIAGNOSIS — I10 Essential (primary) hypertension: Secondary | ICD-10-CM

## 2021-01-09 DIAGNOSIS — R0683 Snoring: Secondary | ICD-10-CM | POA: Diagnosis not present

## 2021-01-09 DIAGNOSIS — M79605 Pain in left leg: Secondary | ICD-10-CM

## 2021-01-09 LAB — BASIC METABOLIC PANEL
BUN: 20 mg/dL (ref 6–23)
CO2: 33 mEq/L — ABNORMAL HIGH (ref 19–32)
Calcium: 10.7 mg/dL — ABNORMAL HIGH (ref 8.4–10.5)
Chloride: 97 mEq/L (ref 96–112)
Creatinine, Ser: 0.83 mg/dL (ref 0.40–1.20)
GFR: 83.62 mL/min (ref >60.00)
Glucose, Bld: 91 mg/dL (ref 70–99)
Potassium: 4 mEq/L (ref 3.5–5.1)
Sodium: 136 mEq/L (ref 135–145)

## 2021-01-09 LAB — CBC WITH DIFFERENTIAL/PLATELET
Basophils Absolute: 0 10*3/uL (ref 0.0–0.1)
Basophils Relative: 0.2 % (ref 0.0–3.0)
Eosinophils Absolute: 0 10*3/uL (ref 0.0–0.7)
Eosinophils Relative: 0.8 % (ref 0.0–5.0)
HCT: 45.6 % (ref 36.0–46.0)
Hemoglobin: 15.1 g/dL — ABNORMAL HIGH (ref 12.0–15.0)
Lymphocytes Relative: 47.3 % — ABNORMAL HIGH (ref 12.0–46.0)
Lymphs Abs: 2 10*3/uL (ref 0.7–4.0)
MCHC: 33.1 g/dL (ref 30.0–36.0)
MCV: 86.1 fl (ref 78.0–100.0)
Monocytes Absolute: 0.5 10*3/uL (ref 0.1–1.0)
Monocytes Relative: 11.1 % (ref 3.0–12.0)
Neutro Abs: 1.7 10*3/uL (ref 1.4–7.7)
Neutrophils Relative %: 40.6 % — ABNORMAL LOW (ref 43.0–77.0)
Platelets: 364 10*3/uL (ref 150.0–400.0)
RBC: 5.3 Mil/uL — ABNORMAL HIGH (ref 3.87–5.11)
RDW: 15.3 % (ref 11.5–15.5)
WBC: 4.2 10*3/uL (ref 4.0–10.5)

## 2021-01-09 MED ORDER — OLMESARTAN-AMLODIPINE-HCTZ 40-10-25 MG PO TABS
1.0000 | ORAL_TABLET | Freq: Every day | ORAL | 0 refills | Status: DC
  Filled 2021-01-09: qty 30, 30d supply, fill #0

## 2021-01-09 NOTE — Telephone Encounter (Signed)
A user error has taken place: encounter opened in error, closed for administrative reasons.

## 2021-01-09 NOTE — Progress Notes (Signed)
Subjective:    Patient ID: Valerie Gilmore, female    DOB: 12/07/1970, 50 y.o.   MRN: [id number]  Chief Complaint  Patient presents with   Hypertension    Elevated lately, some headaches. 162/103 154/98  Going on about 2 weeks.   Extremity Weakness   Shortness of Breath    HPI Patient was seen today for ongoing concern.  Patient endorses elevated BP x2 weeks.  Patient states her "normal" BP is typically 130-140/89-95 on olmesartan-amlodipine-hydrochlorothiazide 40-5-25 mg however the last 2 weeks has been 150s-160s over 90s-100s.  States has had high blood pressure since she was 19.  Endorses headaches.  Patient also endorses charley horse in the left calf yesterday.  Endorses left calf pain and edema ongoing.  Endorses general unwell feeling and occasional SOB.  Patient endorses eating out a lot, does not cook.  Patient states she was told she snores nightly and gasp for air.  Past Medical History:  Diagnosis Date   Depression    Hypertension    high blood pressure readings    Migraine     Allergies  Allergen Reactions   Sulfa Antibiotics Other (See Comments)    Hallucinations    Other Rash    Powder gloves cause a rash    ROS General: Denies fever, chills, night sweats, changes in weight, changes in appetite + General: HEENT: Denies ear pain, changes in vision, rhinorrhea, sore throat +HA CV: Denies CP, palpitations, SOB, orthopnea  +SOB, L LE edema, calf pain Pulm: Denies SOB, cough, wheezing +SOB GI: Denies abdominal pain, nausea, vomiting, diarrhea, constipation GU: Denies dysuria, hematuria, frequency, vaginal discharge Msk: Denies joint pains  + muscle cramps in left calf Neuro: Denies weakness, numbness, tingling Skin: Denies rashes, bruising Psych: Denies depression, anxiety, hallucinations      Objective:    Blood pressure (!) 158/102, pulse 74, temperature 98.9 F (37.2 C), temperature source Oral, weight 195 lb 12.8 oz (88.8 kg), SpO2 97 %.  Gen. Pleasant,  well-nourished, in no distress, normal affect   HEENT: [place]/AT, face symmetric, conjunctiva clear, no scleral icterus, PERRLA, EOMI, nares patent without drainage, pharynx without erythema or exudate. Neck: No JVD, no thyromegaly, no carotid bruits Lungs: no accessory muscle use, CTAB, no wheezes or rales Cardiovascular: RRR, no m/r/g, no peripheral edema Abdomen: BS present, soft, NT/ND, no hepatosplenomegaly. Musculoskeletal: TTP of left calf and medial thigh.  Left OFV slightly monitored number.  No deformities, no cyanosis or clubbing, normal tone Neuro:  A&Ox3, CN II-XII intact, normal gait Skin:  Warm, no lesions/ rash   Wt Readings from Last 3 Encounters:  12/31/20 197 lb (89.4 kg)  12/11/20 195 lb (88.5 kg)  10/30/20 190 lb (86.2 kg)    Lab Results  Component Value Date   WBC 4.4 03/26/2020   HGB 14.1 03/26/2020   HCT 41.8 03/26/2020   PLT 316.0 03/26/2020   GLUCOSE 116 (H) 03/26/2020   CHOL 166 03/26/2020   TRIG 104.0 03/26/2020   HDL 41.90 03/26/2020   LDLDIRECT 119.0 01/17/2018   LDLCALC 103 (H) 03/26/2020   ALT 19 03/26/2020   AST 15 03/26/2020   NA 139 03/26/2020   K 3.8 03/26/2020   CL 103 03/26/2020   CREATININE 0.88 03/26/2020   BUN 16 03/26/2020   CO2 28 03/26/2020   TSH 1.59 03/26/2020   HGBA1C 6.3 03/26/2020    Assessment/Plan:  Acute pain of left lower extremity -Rule out DVT. -Labs and ultrasound ordered. -Given strict precautions - Plan: D-dimer,  Quantitative, CBC with Differential/Platelet, VAS [place] LOWER EXTREMITY VENOUS (DVT)  Edema of left lower extremity  - Plan: D-dimer, Quantitative, CBC with Differential/Platelet, VAS [place] LOWER EXTREMITY VENOUS (DVT)  Essential hypertension -uncontrolled -We will increase Norvasc from 5 mg to 10 mg.  Discussed r/b/a -Continue olmesartan 40 mg and hydrochlorothiazide 25 mg -Given list of muscle cramps obtain BMP as may benefit from potassium supplement - Plan: CBC with Differential/Platelet, Basic  metabolic panel, Ambulatory referral to Sleep Studies  Snoring  -Weight loss encouraged -Sleep study -Plan: CBC with Differential/Platelet, Ambulatory referral to Sleep Studies  F/u as needed with PCP  Abbe Amsterdam, MD

## 2021-01-09 NOTE — Patient Instructions (Addendum)
The dose of your blood pressure medication olmesartan-amlodipine-hydrochlorothiazide was adjusted at this visit.  The dose of the amlodipine was increased from 5 mg to 10 mg.  A new prescription for this combination pill with the increased dose of amlodipine was sent to your pharmacy.  Is important that you schedule follow-up appointment in the next 2 weeks with your PCP to monitor your blood pressure.  A referral was placed for you to ultrasound of your left leg to assess for a DVT (blood clot).  A sleep study was also ordered.  You should receive a call about scheduling this appointment.

## 2021-01-10 LAB — D-DIMER, QUANTITATIVE: D-Dimer, Quant: 1.95 mcg/mL FEU — ABNORMAL HIGH (ref <0.50)

## 2021-01-12 ENCOUNTER — Emergency Department (HOSPITAL_BASED_OUTPATIENT_CLINIC_OR_DEPARTMENT_OTHER)
Admission: EM | Admit: 2021-01-12 | Discharge: 2021-01-12 | Disposition: A | Discharge status: Home / Self Care | Payer: BC Managed Care – PPO | Attending: Emergency Medicine | Admitting: Emergency Medicine

## 2021-01-12 ENCOUNTER — Other Ambulatory Visit (HOSPITAL_COMMUNITY): Payer: Self-pay

## 2021-01-12 ENCOUNTER — Emergency Department (HOSPITAL_BASED_OUTPATIENT_CLINIC_OR_DEPARTMENT_OTHER): Payer: BC Managed Care – PPO

## 2021-01-12 ENCOUNTER — Encounter (HOSPITAL_BASED_OUTPATIENT_CLINIC_OR_DEPARTMENT_OTHER): Payer: Self-pay

## 2021-01-12 ENCOUNTER — Other Ambulatory Visit: Payer: Self-pay

## 2021-01-12 DIAGNOSIS — I10 Essential (primary) hypertension: Secondary | ICD-10-CM | POA: Diagnosis not present

## 2021-01-12 DIAGNOSIS — M79662 Pain in left lower leg: Secondary | ICD-10-CM | POA: Diagnosis not present

## 2021-01-12 DIAGNOSIS — R519 Headache, unspecified: Secondary | ICD-10-CM | POA: Diagnosis not present

## 2021-01-12 DIAGNOSIS — E876 Hypokalemia: Secondary | ICD-10-CM | POA: Diagnosis not present

## 2021-01-12 DIAGNOSIS — Z79899 Other long term (current) drug therapy: Secondary | ICD-10-CM | POA: Insufficient documentation

## 2021-01-12 DIAGNOSIS — R0602 Shortness of breath: Secondary | ICD-10-CM | POA: Diagnosis not present

## 2021-01-12 DIAGNOSIS — R030 Elevated blood-pressure reading, without diagnosis of hypertension: Secondary | ICD-10-CM | POA: Diagnosis present

## 2021-01-12 LAB — COMPREHENSIVE METABOLIC PANEL
ALT: 30 U/L (ref 0–44)
AST: 23 U/L (ref 15–41)
Albumin: 4.8 g/dL (ref 3.5–5.0)
Alkaline Phosphatase: 43 U/L (ref 38–126)
Anion gap: 10 (ref 5–15)
BUN: 16 mg/dL (ref 6–20)
CO2: 28 mmol/L (ref 22–32)
Calcium: 9.8 mg/dL (ref 8.9–10.3)
Chloride: 97 mmol/L — ABNORMAL LOW (ref 98–111)
Creatinine, Ser: 0.8 mg/dL (ref 0.44–1.00)
GFR, Estimated: >60 mL/min (ref >60)
Glucose, Bld: 118 mg/dL — ABNORMAL HIGH (ref 70–99)
Potassium: 2.9 mmol/L — ABNORMAL LOW (ref 3.5–5.1)
Sodium: 135 mmol/L (ref 135–145)
Total Bilirubin: 0.3 mg/dL (ref 0.3–1.2)
Total Protein: 8.8 g/dL — ABNORMAL HIGH (ref 6.5–8.1)

## 2021-01-12 LAB — CBC
HCT: 47.2 % — ABNORMAL HIGH (ref 36.0–46.0)
Hemoglobin: 15.7 g/dL — ABNORMAL HIGH (ref 12.0–15.0)
MCH: 28.1 pg (ref 26.0–34.0)
MCHC: 33.3 g/dL (ref 30.0–36.0)
MCV: 84.6 fL (ref 80.0–100.0)
Platelets: 285 10*3/uL (ref 150–400)
RBC: 5.58 MIL/uL — ABNORMAL HIGH (ref 3.87–5.11)
RDW: 14.5 % (ref 11.5–15.5)
WBC: 6.3 10*3/uL (ref 4.0–10.5)
nRBC: 0 % (ref 0.0–0.2)

## 2021-01-12 LAB — BRAIN NATRIURETIC PEPTIDE: B Natriuretic Peptide: 19.9 pg/mL (ref 0.0–100.0)

## 2021-01-12 LAB — TROPONIN I (HIGH SENSITIVITY): Troponin I (High Sensitivity): 5 ng/L (ref <18)

## 2021-01-12 MED ORDER — POTASSIUM CHLORIDE CRYS ER 20 MEQ PO TBCR
40.0000 meq | EXTENDED_RELEASE_TABLET | Freq: Once | ORAL | Status: AC
  Administered 2021-01-12: 40 meq via ORAL
  Filled 2021-01-12: qty 2

## 2021-01-12 MED ORDER — IOHEXOL 350 MG/ML SOLN
100.0000 mL | Freq: Once | INTRAVENOUS | Status: AC | PRN
  Administered 2021-01-12: 100 mL via INTRAVENOUS

## 2021-01-12 NOTE — Progress Notes (Signed)
Called patient to advise, no answer. 

## 2021-01-12 NOTE — ED Provider Notes (Signed)
[facility] EMERGENCY DEPARTMENT Provider Note   CSN: [id number] Arrival date & time: 01/12/21  1750     History Chief Complaint  Patient presents with   Shortness of Breath   Abnormal Lab   Leg Pain    Valerie Gilmore is a 50 y.o. female.  Valerie Gilmore is a 51 y.o. female with hx of HTN, depression and migraines, who presents for elevated D-dimer. Pt was recently seen by PCP and evaluated for hypertension, some intermittent shortness of breath and left lower leg pain with intermittent swelling. D-dimer was sent as part of evaluation and returned elevated. Pt was to be schedule for US of the left lower extremity as well, but this has not been done yet. Pt with no prior ox of PE or DVT. Pt denies any chest pain, but has occassionally experienced shortness of breath. Reports she sometimes has headaches when her BP is elevated. Pt reports she has had difficulty getting her BP under control and this gives her a lot of anxiety, as she does not want long term complications of HTN. She reports for months she has intermittently experienced pain in her left leg and some mild swelling. No other aggravating or alleviating factors.  The history is provided by the patient and medical records.  Shortness of Breath Associated symptoms: headaches   Associated symptoms: no abdominal pain, no chest pain, no cough, no fever, no rash and no vomiting   Abnormal Lab Leg Pain Associated symptoms: no fever       Past Medical History:  Diagnosis Date   Depression    Hypertension    high blood pressure readings    Migraine     Patient Active Problem List   Diagnosis Date Noted   Anxiety and depression 02/15/2019   Carpal tunnel syndrome 04/13/2018   Degenerative disc disease at L5-S1 level 01/24/2018   Essential hypertension 12/12/2013    Past Surgical History:  Procedure Laterality Date   ABDOMINAL HYSTERECTOMY  2011     OB History   No obstetric history on file.     Family  History  Problem Relation Age of Onset   Breast cancer Paternal Grandmother    Prostate cancer Father    Hypertension Other        both sides     Social History   Tobacco Use   Smoking status: Never   Smokeless tobacco: Never  Vaping Use   Vaping Use: Every day  Substance Use Topics   Alcohol use: Yes    Comment: rare   Drug use: No    Home Medications Prior to Admission medications   Medication Sig Start Date End Date Taking? Authorizing Provider  baclofen (LIORESAL) 10 MG tablet Take 10 mg by mouth every 8 (eight) hours as needed. Patient not taking: Reported on 10/15/2020 03/12/20   [provider]  baclofen (LIORESAL) 10 MG tablet TAKE 1 TABLET BY MOUTH EVERY 8 HOURS AS NEEDED Patient not taking: Reported on 10/15/2020 03/12/20 03/12/21  Armida Sans, PA-C  cyclobenzaprine (FLEXERIL) 5 MG tablet Take 1 tablet (5 mg total) by mouth at bedtime. 12/31/20   Richardean Sale, DO  fluconazole (DIFLUCAN) 150 MG tablet TAKE 1 TABLET BY MOUTH BY MOUTH AS NEEDED Patient not taking: Reported on 10/30/2020 05/07/20 05/07/21  Kathreen Cosier, MD  ibuprofen (IBU) 800 MG tablet Take 1 tablet (800 mg total) by mouth every 8 (eight) hours as needed. 09/17/20   Shirline Frees, NP  meloxicam [facility])  15 MG tablet TAKE 1 TABLET BY MOUTH ONCE A DAY FOR 2 WEEKS THEN USE AS NEEDED AFTERWARDS Patient not taking: Reported on 10/30/2020 03/12/20 03/12/21  Armida Sans, PA-C  meloxicam (MOBIC) 15 MG tablet Take 1 tablet (15 mg total) by mouth daily. 12/31/20   Richardean Sale, DO  metFORMIN (GLUCOPHAGE) 500 MG tablet TAKE 1/2 TABLET BY MOUTH ONCE A DAY WITH BREAKFAST 03/26/20 03/26/21  Nafziger, Kandee Keen, NP  nortriptyline (PAMELOR) 25 MG capsule Take 1 capsule (25 mg total) by mouth at bedtime. 10/15/20   Jaffe, Adam R, DO  Olmesartan-amLODIPine-HCTZ 40-10-25 MG TABS Take 1 tablet by mouth daily. 01/09/21   Deeann Saint, MD  pantoprazole (PROTONIX) 40 MG tablet TAKE 1 TABLET BY MOUTH ONCE A DAY 03/26/20  03/26/21  Nafziger, Kandee Keen, NP  predniSONE (STERAPRED UNI-PAK 48 TAB) 5 MG (48) TBPK tablet Take as directed per package instructions. 10/30/20   Richardean Sale, DO  triamcinolone cream (KENALOG) 0.1 % Apply 1 application topically 2 (two) times daily. 07/12/19   Terressa Koyanagi, DO    Allergies    Sulfa antibiotics and Other  Review of Systems   Review of Systems  Constitutional:  Negative for chills and fever.  HENT: Negative.    Eyes:  Negative for visual disturbance.  Respiratory:  Positive for shortness of breath. Negative for cough.   Cardiovascular:  Positive for leg swelling. Negative for chest pain and palpitations.  Gastrointestinal:  Negative for abdominal pain, nausea and vomiting.  Genitourinary:  Negative for dysuria.  Musculoskeletal:  Positive for myalgias. Negative for arthralgias.  Skin:  Negative for color change and rash.  Neurological:  Positive for headaches. Negative for dizziness, syncope, weakness, light-headedness and numbness.  All other systems reviewed and are negative.  Physical Exam Updated Vital Signs BP (!) 189/110 (BP Location: Right Arm)   Pulse 83   Temp 98.7 F (37.1 C) (Oral)   Resp (!) 23   Ht 5\' 7"  (1.702 m)   Wt 89.8 kg   SpO2 99%   BMI 31.01 kg/m   Physical Exam Vitals and nursing note reviewed.  Constitutional:      General: She is not in acute distress.    Appearance: Normal appearance. She is well-developed. She is not ill-appearing or diaphoretic.  HENT:     Head: Normocephalic and atraumatic.  Eyes:     General:        Right eye: No discharge.        Left eye: No discharge.     Pupils: Pupils are equal, round, and reactive to light.  Cardiovascular:     Rate and Rhythm: Normal rate and regular rhythm.     Pulses: Normal pulses.     Heart sounds: Normal heart sounds.  Pulmonary:     Effort: Pulmonary effort is normal. No respiratory distress.     Breath sounds: Normal breath sounds. No wheezing or rales.     Comments:  Respirations equal and unlabored, patient able to speak in full sentences, lungs clear to auscultation bilaterally  Chest:     Chest wall: No tenderness.  Abdominal:     General: Bowel sounds are normal. There is no distension.     Palpations: Abdomen is soft. There is no mass.     Tenderness: There is no abdominal tenderness. There is no guarding.     Comments: Abdomen soft, nondistended, nontender to palpation in all quadrants without guarding or peritoneal signs  Musculoskeletal:  General: No deformity.     Cervical back: Neck supple.     Right lower leg: No tenderness. No edema.     Left lower leg: No edema.     Comments: No appreciable edema in bilateral lower extremities, mild tenderness of left calf, no overlying skin changes, distal pulses 2+  Skin:    General: Skin is warm and dry.     Capillary Refill: Capillary refill takes less than 2 seconds.  Neurological:     Mental Status: She is alert and oriented to person, place, and time.     Coordination: Coordination normal.     Comments: Speech is clear, able to follow commands Moves extremities without ataxia, coordination intact  Psychiatric:        Mood and Affect: Mood normal.        Behavior: Behavior normal.    ED Results / Procedures / Treatments   Labs (all labs ordered are listed, but only abnormal results are displayed) Labs Reviewed  COMPREHENSIVE METABOLIC PANEL - Abnormal; Notable for the following components:      Result Value   Potassium 2.9 (*)    Chloride 97 (*)    Glucose, Bld 118 (*)    Total Protein 8.8 (*)    All other components within normal limits  CBC - Abnormal; Notable for the following components:   RBC 5.58 (*)    Hemoglobin 15.7 (*)    HCT 47.2 (*)    All other components within normal limits  BRAIN NATRIURETIC PEPTIDE  TROPONIN I (HIGH SENSITIVITY)    EKG EKG Interpretation  Date/Time:  Monday January 12 2021 18:03:11 EST Ventricular Rate:  93 PR Interval:  126 QRS  Duration: 98 QT Interval:  382 QTC Calculation: 474 R Axis:   -12 Text Interpretation: Normal sinus rhythm Confirmed by Cathren Laine ([id number]) on 01/12/2021 6:08:14 PM  Radiology CT Angio Chest PE W/Cm &/Or Wo Cm  Result Date: 01/12/2021 CLINICAL DATA:  Insert his EXAM: CT ANGIOGRAPHY CHEST WITH CONTRAST TECHNIQUE: Multidetector CT imaging of the chest was performed using the standard protocol during bolus administration of intravenous contrast. Multiplanar CT image reconstructions and MIPs were obtained to evaluate the vascular anatomy. CONTRAST:  OMNIPAQUE IOHEXOL 350 MG/ML SOLN COMPARISON:  None. FINDINGS: Cardiovascular: Normal cardiac size. No pericardial disease. No evidence of pulmonary embolism. The thoracic aorta is unremarkable. Mediastinum/Nodes: No mediastinal, hilar, or axillary lymphadenopathy. The thyroid is unremarkable. The esophagus is unremarkable. Lungs/Pleura: No focal airspace consolidation. No suspicious pulmonary nodules or masses. No pleural effusion. No pneumothorax. Upper Abdomen: Hepatic steatosis.  No acute abnormality. Musculoskeletal: No acute osseous abnormality. No suspicious lytic or blastic lesions. Review of the MIP images confirms the above findings. IMPRESSION: No pulmonary embolism or other acute findings in the chest. Electronically Signed   By: Caprice Renshaw M.D.   On: 01/12/2021 19:49   US Venous Img Lower  Left (DVT Study)  Result Date: 01/12/2021 CLINICAL DATA:  Left calf pain EXAM: LEFT LOWER EXTREMITY VENOUS DOPPLER ULTRASOUND TECHNIQUE: Gray-scale sonography with compression, as well as color and duplex ultrasound, were performed to evaluate the deep venous system(s) from the level of the common femoral vein through the popliteal and proximal calf veins. COMPARISON:  None. FINDINGS: VENOUS Normal compressibility of the common femoral, superficial femoral, and popliteal veins, as well as the visualized calf veins. Visualized portions of profunda femoral  vein and great saphenous vein unremarkable. No filling defects to suggest DVT on grayscale or color Doppler imaging. Doppler  waveforms show normal direction of venous flow, normal respiratory plasticity and response to augmentation. Limited views of the contralateral common femoral vein are unremarkable. OTHER None. Limitations: none IMPRESSION: Negative. Electronically Signed   By: Helyn Numbers M.D.   On: 01/12/2021 21:56     Procedures Procedures   Medications Ordered in ED Medications  iohexol (OMNIPAQUE) 350 MG/ML injection 100 mL (100 mLs Intravenous Contrast Given 01/12/21 1925)  potassium chloride SA (KLOR-CON M) CR tablet 40 mEq (40 mEq Oral Given 01/12/21 2112)    ED Course  I have reviewed the triage vital signs and the nursing notes.  Pertinent labs & imaging results that were available during my care of the patient were reviewed by me and considered in my medical decision making (see chart for details).    MDM Rules/Calculators/A&P                           50 yo F sent for elevated D-dimer, currently denies Chest pain or shortness of breath, but has had SOB intermittently and left lower leg pain and swelling. On arrival pt  is well appearing, noted to be hypertensive, but asymptomatic. High blood pressure is a point of anxiety for pt. Will get CTA PE study and left lower extremity DVT study as well as blood work.  I have independently ordered, reviewed and interpreted all labs and imaging: CBC: no leukocytosis, Hgb mildly elevated CMP: mild hypokalemia 2.9, po potassium given, no other significant electrolyte derangements, normal renal and liver function Trop: Neg BNP: neg  CTA: no PE or other active cardiopulmonary disease  DVT study: negative  Discussed reassuring workup with pt. Her BP has improved without intervention. Pt stable for DC with continued PCP follow up. Provided info for advanced HTN clinic if pt continues to have difficulty getting PT under control.  Return precautions provided. Discharged home in good condition  Final Clinical Impression(s) / ED Diagnoses Final diagnoses:  Hypertension, unspecified type  Pain of left calf  Hypokalemia    Rx / DC Orders ED Discharge Orders     None        Legrand Rams 01/16/21 1237    Cathren Laine, MD 01/19/21 1534

## 2021-01-12 NOTE — ED Notes (Signed)
Patient transported to CT 

## 2021-01-12 NOTE — ED Notes (Signed)
OK for pt to have ice chips at this  moment per RN Shanda Bumps

## 2021-01-12 NOTE — ED Triage Notes (Signed)
Pt c/o SOB, weakness, pain to left leg, elevated BP, diaphoresis at night sx started 2 weeks ago-states she was seen by PCP 12/2-was called today and advsied to come to ED due to ddimer and calcium-NAD-steady gait

## 2021-01-12 NOTE — Discharge Instructions (Signed)
Your evaluation today has been very reassuring and we see no signs of a blood clot in your leg or your lungs and your lab work is overall reassuring.  Your potassium was a bit low today and this should be rechecked with your primary care provider.  If you continue having issues getting your blood pressure under control you can follow-up with the advanced hypertension clinic with Dr. Duke Salvia.  Continue following up with your primary doctor, return for new or worsening symptoms.

## 2021-01-13 ENCOUNTER — Encounter (HOSPITAL_COMMUNITY): Payer: BC Managed Care – PPO

## 2021-01-21 ENCOUNTER — Telehealth: Payer: Self-pay | Admitting: Adult Health

## 2021-01-21 NOTE — Telephone Encounter (Signed)
Para March from [facility] call and stated he couldn't make a appt for the DVT because pt couldn't be reached . Para March 's # is [phone]

## 2021-01-21 NOTE — Telephone Encounter (Signed)
FYI

## 2021-01-22 ENCOUNTER — Other Ambulatory Visit (HOSPITAL_COMMUNITY): Payer: Self-pay

## 2021-01-22 MED FILL — Metformin HCl Tab 500 MG: ORAL | 30 days supply | Qty: 15 | Fill #2 | Status: AC

## 2021-02-04 NOTE — Telephone Encounter (Signed)
Para March is calling to see if md still wants the test he has been unable to reach the pt

## 2021-02-17 ENCOUNTER — Other Ambulatory Visit (HOSPITAL_COMMUNITY): Payer: Self-pay

## 2021-02-17 MED FILL — Metformin HCl Tab 500 MG: ORAL | 30 days supply | Qty: 15 | Fill #3 | Status: AC

## 2021-02-18 ENCOUNTER — Other Ambulatory Visit (HOSPITAL_COMMUNITY): Payer: Self-pay

## 2021-02-18 ENCOUNTER — Other Ambulatory Visit: Payer: Self-pay | Admitting: Adult Health

## 2021-02-18 MED ORDER — OLMESARTAN-AMLODIPINE-HCTZ 40-10-25 MG PO TABS
1.0000 | ORAL_TABLET | Freq: Every day | ORAL | 0 refills | Status: DC
  Filled 2021-02-18: qty 30, 30d supply, fill #0

## 2021-02-19 ENCOUNTER — Other Ambulatory Visit (HOSPITAL_COMMUNITY): Payer: Self-pay

## 2021-02-20 ENCOUNTER — Other Ambulatory Visit (HOSPITAL_COMMUNITY): Payer: Self-pay

## 2021-03-18 ENCOUNTER — Other Ambulatory Visit (HOSPITAL_COMMUNITY): Payer: Self-pay

## 2021-03-18 MED FILL — Metformin HCl Tab 500 MG: ORAL | 30 days supply | Qty: 15 | Fill #4 | Status: AC

## 2021-03-31 ENCOUNTER — Other Ambulatory Visit (HOSPITAL_COMMUNITY): Payer: Self-pay

## 2021-03-31 ENCOUNTER — Other Ambulatory Visit: Payer: Self-pay | Admitting: Adult Health

## 2021-04-01 ENCOUNTER — Other Ambulatory Visit (HOSPITAL_COMMUNITY): Payer: Self-pay

## 2021-04-01 MED ORDER — OLMESARTAN-AMLODIPINE-HCTZ 40-10-25 MG PO TABS
1.0000 | ORAL_TABLET | Freq: Every day | ORAL | 0 refills | Status: DC
  Filled 2021-04-01: qty 30, 30d supply, fill #0

## 2021-04-01 NOTE — Telephone Encounter (Signed)
Pt is due for CPE. Rx will be refilled for 1 month only. Pt will need to schedule appt. For further refills.

## 2021-04-02 ENCOUNTER — Other Ambulatory Visit (HOSPITAL_COMMUNITY): Payer: Self-pay

## 2021-04-03 ENCOUNTER — Other Ambulatory Visit (HOSPITAL_COMMUNITY): Payer: Self-pay

## 2021-05-20 ENCOUNTER — Other Ambulatory Visit: Payer: Self-pay | Admitting: Adult Health

## 2021-05-20 ENCOUNTER — Other Ambulatory Visit (HOSPITAL_COMMUNITY): Payer: Self-pay

## 2021-05-20 DIAGNOSIS — R7303 Prediabetes: Secondary | ICD-10-CM

## 2021-05-20 MED ORDER — METFORMIN HCL 500 MG PO TABS
500.0000 mg | ORAL_TABLET | Freq: Every day | ORAL | 0 refills | Status: DC
  Filled 2021-05-20: qty 45, 45d supply, fill #0

## 2021-05-21 ENCOUNTER — Other Ambulatory Visit (HOSPITAL_COMMUNITY): Payer: Self-pay

## 2021-05-21 ENCOUNTER — Telehealth: Payer: Self-pay | Admitting: Adult Health

## 2021-05-21 ENCOUNTER — Other Ambulatory Visit: Payer: Self-pay

## 2021-05-21 MED ORDER — OLMESARTAN-AMLODIPINE-HCTZ 40-10-25 MG PO TABS
1.0000 | ORAL_TABLET | Freq: Every day | ORAL | 0 refills | Status: DC
  Filled 2021-05-21: qty 30, 30d supply, fill #0

## 2021-05-21 NOTE — Telephone Encounter (Signed)
Rx refilled for 30 days.  

## 2021-05-21 NOTE — Telephone Encounter (Signed)
Pt has appt 06/16/21 for bp check and refill of Olmesartan-amLODIPine-HCTZ 40-10-25 MG TABS   ?[facility] Phone:  [phone]  ?Fax:  [phone]  ?  ? ?

## 2021-05-22 ENCOUNTER — Other Ambulatory Visit (HOSPITAL_COMMUNITY): Payer: Self-pay

## 2021-06-16 ENCOUNTER — Other Ambulatory Visit: Payer: Self-pay | Admitting: Adult Health

## 2021-06-16 ENCOUNTER — Ambulatory Visit: Payer: BC Managed Care – PPO | Admitting: Adult Health

## 2021-06-16 ENCOUNTER — Encounter: Payer: Self-pay | Admitting: Adult Health

## 2021-06-16 VITALS — BP 126/90 | HR 73 | Temp 98.4°F | Ht 67.0 in | Wt 193.0 lb

## 2021-06-16 DIAGNOSIS — K219 Gastro-esophageal reflux disease without esophagitis: Secondary | ICD-10-CM

## 2021-06-16 DIAGNOSIS — E7439 Other disorders of intestinal carbohydrate absorption: Secondary | ICD-10-CM | POA: Diagnosis not present

## 2021-06-16 DIAGNOSIS — Z114 Encounter for screening for human immunodeficiency virus [HIV]: Secondary | ICD-10-CM

## 2021-06-16 DIAGNOSIS — R7303 Prediabetes: Secondary | ICD-10-CM

## 2021-06-16 DIAGNOSIS — Z0000 Encounter for general adult medical examination without abnormal findings: Secondary | ICD-10-CM

## 2021-06-16 DIAGNOSIS — Z1211 Encounter for screening for malignant neoplasm of colon: Secondary | ICD-10-CM

## 2021-06-16 DIAGNOSIS — I10 Essential (primary) hypertension: Secondary | ICD-10-CM

## 2021-06-16 LAB — COMPREHENSIVE METABOLIC PANEL
ALT: 25 U/L (ref 0–35)
AST: 19 U/L (ref 0–37)
Albumin: 4.3 g/dL (ref 3.5–5.2)
Alkaline Phosphatase: 30 U/L — ABNORMAL LOW (ref 39–117)
BUN: 11 mg/dL (ref 6–23)
CO2: 27 mEq/L (ref 19–32)
Calcium: 9.2 mg/dL (ref 8.4–10.5)
Chloride: 103 mEq/L (ref 96–112)
Creatinine, Ser: 0.88 mg/dL (ref 0.40–1.20)
GFR: 77.71 mL/min (ref >60.00)
Glucose, Bld: 110 mg/dL — ABNORMAL HIGH (ref 70–99)
Potassium: 3.8 mEq/L (ref 3.5–5.1)
Sodium: 138 mEq/L (ref 135–145)
Total Bilirubin: 0.4 mg/dL (ref 0.2–1.2)
Total Protein: 7.4 g/dL (ref 6.0–8.3)

## 2021-06-16 LAB — CBC WITH DIFFERENTIAL/PLATELET
Basophils Absolute: 0 10*3/uL (ref 0.0–0.1)
Basophils Relative: 0.4 % (ref 0.0–3.0)
Eosinophils Absolute: 0 10*3/uL (ref 0.0–0.7)
Eosinophils Relative: 1 % (ref 0.0–5.0)
HCT: 40.2 % (ref 36.0–46.0)
Hemoglobin: 13.3 g/dL (ref 12.0–15.0)
Lymphocytes Relative: 54.9 % — ABNORMAL HIGH (ref 12.0–46.0)
Lymphs Abs: 2.6 10*3/uL (ref 0.7–4.0)
MCHC: 32.9 g/dL (ref 30.0–36.0)
MCV: 84.2 fl (ref 78.0–100.0)
Monocytes Absolute: 0.6 10*3/uL (ref 0.1–1.0)
Monocytes Relative: 12.4 % — ABNORMAL HIGH (ref 3.0–12.0)
Neutro Abs: 1.5 10*3/uL (ref 1.4–7.7)
Neutrophils Relative %: 31.3 % — ABNORMAL LOW (ref 43.0–77.0)
Platelets: 314 10*3/uL (ref 150.0–400.0)
RBC: 4.78 Mil/uL (ref 3.87–5.11)
RDW: 15 % (ref 11.5–15.5)
WBC: 4.8 10*3/uL (ref 4.0–10.5)

## 2021-06-16 LAB — HEMOGLOBIN A1C: Hgb A1c MFr Bld: 6.3 % (ref 4.6–6.5)

## 2021-06-16 LAB — TSH: TSH: 2.31 u[IU]/mL (ref 0.35–5.50)

## 2021-06-16 LAB — LIPID PANEL
Cholesterol: 182 mg/dL (ref 0–200)
HDL: 45.4 mg/dL (ref >39.00)
LDL Cholesterol: 115 mg/dL — ABNORMAL HIGH (ref 0–99)
NonHDL: 136.89
Total CHOL/HDL Ratio: 4
Triglycerides: 110 mg/dL (ref 0.0–149.0)
VLDL: 22 mg/dL (ref 0.0–40.0)

## 2021-06-16 MED ORDER — OLMESARTAN-AMLODIPINE-HCTZ 40-10-25 MG PO TABS
1.0000 | ORAL_TABLET | Freq: Every day | ORAL | 3 refills | Status: DC
  Filled 2021-06-16: qty 90, 90d supply, fill #0
  Filled 2021-09-26: qty 90, 90d supply, fill #1
  Filled 2021-12-26 – 2022-01-16 (×2): qty 90, 90d supply, fill #2
  Filled 2022-04-13: qty 90, 90d supply, fill #3

## 2021-06-16 MED ORDER — METFORMIN HCL 500 MG PO TABS
500.0000 mg | ORAL_TABLET | Freq: Every day | ORAL | 3 refills | Status: DC
  Filled 2021-06-16 – 2021-08-14 (×2): qty 90, 90d supply, fill #0
  Filled 2021-10-02 – 2021-12-21 (×2): qty 90, 90d supply, fill #1
  Filled 2022-04-13: qty 90, 90d supply, fill #2

## 2021-06-16 MED ORDER — IBUPROFEN 800 MG PO TABS
800.0000 mg | ORAL_TABLET | Freq: Three times a day (TID) | ORAL | 3 refills | Status: DC | PRN
  Filled 2021-06-16: qty 30, 10d supply, fill #0
  Filled 2021-08-14: qty 30, 10d supply, fill #1
  Filled 2021-11-05: qty 30, 10d supply, fill #2
  Filled 2021-12-21: qty 30, 10d supply, fill #3

## 2021-06-16 NOTE — Progress Notes (Signed)
? ?Subjective:  ? ? Patient ID: Valerie Gilmore, female    DOB: 07-Dec-1970, 51 y.o.   MRN: [id number] ? ?HPI ?Patient presents for yearly preventative medicine examination. She is a pleasant 51 year old female who  has a past medical history of Depression, Hypertension, and Migraine. ? ?Hypertension - takes Tribenzor 05-19-23 mg daily. She denies side effects of dizziness, lightheadedness, blurred vision, or syncopal episodes.  ?BP Readings from Last 3 Encounters:  ?06/16/21 126/90  ?01/12/21 138/83  ?01/09/21 (!) 158/102  ? ?Pre diabetes - Taking metformin 250 mg daily.  ?Lab Results  ?Component Value Date  ? HGBA1C 6.3 03/26/2020  ? ?GERD - Takes Protonix 40 mg daily - eels controlled.  ? ?All immunizations and health maintenance protocols were reviewed with the patient and needed orders were placed. ? ?Appropriate screening laboratory values were ordered for the patient including screening of hyperlipidemia, renal function and hepatic function. ? ? ?Medication reconciliation,  past medical history, social history, problem list and allergies were reviewed in detail with the patient ? ?Goals were established with regard to weight loss, exercise, and  diet in compliance with medications. She has starting working with a nutritionist and has found this helpful. She has not started exercising yet  ?Lab Results  ?Component Value Date  ? HGBA1C 6.3 03/26/2020  ? ?She refuses colonoscopy but will do cologuard  ? ?Review of Systems  ?Constitutional: Negative.   ?HENT: Negative.    ?Eyes: Negative.   ?Respiratory: Negative.    ?Cardiovascular: Negative.   ?Gastrointestinal: Negative.   ?Endocrine: Negative.   ?Genitourinary: Negative.   ?Musculoskeletal: Negative.   ?Skin: Negative.   ?Allergic/Immunologic: Negative.   ?Neurological: Negative.   ?Hematological: Negative.   ?Psychiatric/Behavioral: Negative.    ? ?Past Medical History:  ?Diagnosis Date  ? Depression   ? Hypertension   ? high blood pressure readings   ? Migraine    ? ? ?Social History  ? ?Socioeconomic History  ? Marital status: Single  ?  Spouse name: Not on file  ? Number of children: Not on file  ? Years of education: Not on file  ? Highest education level: Not on file  ?Occupational History  ? Not on file  ?Tobacco Use  ? Smoking status: Never  ? Smokeless tobacco: Never  ?Vaping Use  ? Vaping Use: Every day  ?Substance and Sexual Activity  ? Alcohol use: Yes  ?  Comment: rare  ? Drug use: No  ? Sexual activity: Not on file  ?Other Topics Concern  ? Not on file  ?Social History Narrative  ? Right handed  ? ?Social Determinants of Health  ? ?Financial Resource Strain: Not on file  ?Food Insecurity: Not on file  ?Transportation Needs: Not on file  ?Physical Activity: Not on file  ?Stress: Not on file  ?Social Connections: Not on file  ?Intimate Partner Violence: Not on file  ? ? ?Past Surgical History:  ?Procedure Laterality Date  ? ABDOMINAL HYSTERECTOMY  2011  ? ? ?Family History  ?Problem Relation Age of Onset  ? Breast cancer Paternal Grandmother   ? Prostate cancer Father   ? Hypertension Other   ?     both sides   ? ? ?Allergies  ?Allergen Reactions  ? Sulfa Antibiotics Other (See Comments)  ?  Hallucinations ?  ? Other Rash  ?  Powder gloves cause a rash  ? ? ?Current Outpatient Medications on File Prior to Visit  ?Medication Sig Dispense Refill  ?  ibuprofen (IBU) 800 MG tablet Take 1 tablet (800 mg total) by mouth every 8 (eight) hours as needed. 30 tablet 3  ? metFORMIN (GLUCOPHAGE) 500 MG tablet Take 1 tablet by mouth daily with breakfast. SCHEDULE PHYSICAL FOR REFILLS 45 tablet 0  ? Olmesartan-amLODIPine-HCTZ 40-10-25 MG TABS Take 1 tablet by mouth daily. 30 tablet 0  ? triamcinolone cream (KENALOG) 0.1 % Apply 1 application topically 2 (two) times daily. 30 g 0  ? pantoprazole (PROTONIX) 40 MG tablet TAKE 1 TABLET BY MOUTH ONCE A DAY 90 tablet 3  ? ?No current facility-administered medications on file prior to visit.  ? ? ?BP 126/90   Pulse 73   Temp 98.4 ?F  (36.9 ?C) (Oral)   Ht 5\' 7"  (1.702 m)   Wt 193 lb (87.5 kg)   SpO2 97%   BMI 30.23 kg/m?  ? ? ?   ?Objective:  ? Physical Exam ?Vitals and nursing note reviewed.  ?Constitutional:   ?   General: She is not in acute distress. ?   Appearance: Normal appearance. She is well-developed. She is not ill-appearing.  ?HENT:  ?   Head: Normocephalic and atraumatic.  ?   Right Ear: Tympanic membrane, ear canal and external ear normal. There is no impacted cerumen.  ?   Left Ear: Tympanic membrane, ear canal and external ear normal. There is no impacted cerumen.  ?   Nose: Nose normal. No congestion or rhinorrhea.  ?   Mouth/Throat:  ?   Mouth: Mucous membranes are moist.  ?   Pharynx: Oropharynx is clear. No oropharyngeal exudate or posterior oropharyngeal erythema.  ?Eyes:  ?   General:     ?   Right eye: No discharge.     ?   Left eye: No discharge.  ?   Extraocular Movements: Extraocular movements intact.  ?   Conjunctiva/sclera: Conjunctivae normal.  ?   Pupils: Pupils are equal, round, and reactive to light.  ?Neck:  ?   Thyroid: No thyromegaly.  ?   Vascular: No carotid bruit.  ?   Trachea: No tracheal deviation.  ?Cardiovascular:  ?   Rate and Rhythm: Normal rate and regular rhythm.  ?   Pulses: Normal pulses.  ?   Heart sounds: Normal heart sounds. No murmur heard. ?  No friction rub. No gallop.  ?Pulmonary:  ?   Effort: Pulmonary effort is normal. No respiratory distress.  ?   Breath sounds: Normal breath sounds. No stridor. No wheezing, rhonchi or rales.  ?Chest:  ?   Chest wall: No tenderness.  ?Abdominal:  ?   General: Abdomen is flat. Bowel sounds are normal. There is no distension.  ?   Palpations: Abdomen is soft. There is no mass.  ?   Tenderness: There is no abdominal tenderness. There is no right CVA tenderness, left CVA tenderness, guarding or rebound.  ?   Hernia: No hernia is present.  ?Musculoskeletal:     ?   General: No swelling, tenderness, deformity or signs of injury. Normal range of motion.  ?    Cervical back: Normal range of motion and neck supple.  ?   Right lower leg: No edema.  ?   Left lower leg: No edema.  ?Lymphadenopathy:  ?   Cervical: No cervical adenopathy.  ?Skin: ?   General: Skin is warm and dry.  ?   Coloration: Skin is not jaundiced or pale.  ?   Findings: No bruising, erythema, lesion or rash.  ?  Neurological:  ?   General: No focal deficit present.  ?   Mental Status: She is alert and oriented to person, place, and time.  ?   Cranial Nerves: No cranial nerve deficit.  ?   Sensory: No sensory deficit.  ?   Motor: No weakness.  ?   Coordination: Coordination normal.  ?   Gait: Gait normal.  ?   Deep Tendon Reflexes: Reflexes normal.  ?Psychiatric:     ?   Mood and Affect: Mood normal.     ?   Behavior: Behavior normal.     ?   Thought Content: Thought content normal.     ?   Judgment: Judgment normal.  ? ? ?   ?Assessment & Plan:  ?1. Routine general medical examination at a health care facility ?- Continue to work on heart healthy diet a start exercising  ?- Follow up in one year or sooner if needed ?- CBC with Differential/Platelet; Future ?- Comprehensive metabolic panel; Future ?- Hemoglobin A1c; Future ?- Lipid panel; Future ?- TSH; Future ? ?2. Essential hypertension ?- Well controlled with current medication  ?- CBC with Differential/Platelet; Future ?- Comprehensive metabolic panel; Future ?- Hemoglobin A1c; Future ?- Lipid panel; Future ?- TSH; Future ? ?3. Glucose intolerance ?- Consider increase in metformin  ?- CBC with Differential/Platelet; Future ?- Comprehensive metabolic panel; Future ?- Hemoglobin A1c; Future ?- Lipid panel; Future ?- TSH; Future ? ?4. Gastroesophageal reflux disease without esophagitis ?- Continue with Protonix  ?- CBC with Differential/Platelet; Future ?- Comprehensive metabolic panel; Future ?- Hemoglobin A1c; Future ?- Lipid panel; Future ?- TSH; Future ? ?5. Colon cancer screening ? ?- Cologuard ? ?6. Encounter for screening for HIV ? ?- HIV Antibody  (routine testing w rflx); Future ? ?Shirline Freesory Gregg Winchell, NP ? ? ?

## 2021-06-16 NOTE — Progress Notes (Deleted)
? ?Subjective:  ? ? Patient ID: Valerie Gilmore, female    DOB: 03-12-1970, 51 y.o.   MRN: [id number] ? ?HPI ? ?Patient presents for yearly preventative medicine examination. She is a pleasant 51 year old female who  has a past medical history of Depression, Hypertension, and Migraine. ? ?Hypertension-controlled with Tribenzor 40-5-25mg .  She denies dizziness, lightheadedness, chest pain, shortness of breath ? ?GERD-takes Protonix 40 mg daily ? ?Glucose intolerance-was prescribed metformin 250mg .   She changed her diet and was able to come off the medication. ?Lab Results  ?Component Value Date  ? HGBA1C 6.3 03/26/2020  ? ?All immunizations and health maintenance protocols were reviewed with the patient and needed orders were placed. ? ?Appropriate screening laboratory values were ordered for the patient including screening of hyperlipidemia, renal function and hepatic function. ? ? ?Medication reconciliation,  past medical history, social history, problem list and allergies were reviewed in detail with the patient ? ?Goals were established with regard to weight loss, exercise, and  diet in compliance with medications. She has started going to a nutritionist and is finding this helpful, she has started to notice weight loss. She has not started exercising yet.  ?Wt Readings from Last 3 Encounters:  ?06/16/21 193 lb (87.5 kg)  ?01/12/21 198 lb (89.8 kg)  ?01/09/21 195 lb 12.8 oz (88.8 kg)  ? ?She is due for colon cancer screening - refuses colonoscopy but will do cologuard.  ? ? ?Review of Systems  ?Constitutional: Negative.   ?HENT: Negative.    ?Eyes: Negative.   ?Respiratory: Negative.    ?Cardiovascular: Negative.   ?Gastrointestinal: Negative.   ?Endocrine: Negative.   ?Genitourinary: Negative.   ?Musculoskeletal: Negative.   ?Skin: Negative.   ?Allergic/Immunologic: Negative.   ?Neurological: Negative.   ?Hematological: Negative.   ?Psychiatric/Behavioral: Negative.    ? ?Past Medical History:  ?Diagnosis Date  ?  Depression   ? Hypertension   ? high blood pressure readings   ? Migraine   ? ? ?Social History  ? ?Socioeconomic History  ? Marital status: Single  ?  Spouse name: Not on file  ? Number of children: Not on file  ? Years of education: Not on file  ? Highest education level: Not on file  ?Occupational History  ? Not on file  ?Tobacco Use  ? Smoking status: Never  ? Smokeless tobacco: Never  ?Vaping Use  ? Vaping Use: Every day  ?Substance and Sexual Activity  ? Alcohol use: Yes  ?  Comment: rare  ? Drug use: No  ? Sexual activity: Not on file  ?Other Topics Concern  ? Not on file  ?Social History Narrative  ? Right handed  ? ?Social Determinants of Health  ? ?Financial Resource Strain: Not on file  ?Food Insecurity: Not on file  ?Transportation Needs: Not on file  ?Physical Activity: Not on file  ?Stress: Not on file  ?Social Connections: Not on file  ?Intimate Partner Violence: Not on file  ? ? ?Past Surgical History:  ?Procedure Laterality Date  ? ABDOMINAL HYSTERECTOMY  2011  ? ? ?Family History  ?Problem Relation Age of Onset  ? Breast cancer Paternal Grandmother   ? Prostate cancer Father   ? Hypertension Other   ?     both sides   ? ? ?Allergies  ?Allergen Reactions  ? Sulfa Antibiotics Other (See Comments)  ?  Hallucinations ?  ? Other Rash  ?  Powder gloves cause a rash  ? ? ?Current Outpatient  Medications on File Prior to Visit  ?Medication Sig Dispense Refill  ? ibuprofen (IBU) 800 MG tablet Take 1 tablet (800 mg total) by mouth every 8 (eight) hours as needed. 30 tablet 3  ? metFORMIN (GLUCOPHAGE) 500 MG tablet Take 1 tablet by mouth daily with breakfast. SCHEDULE PHYSICAL FOR REFILLS 45 tablet 0  ? nortriptyline (PAMELOR) 25 MG capsule Take 1 capsule (25 mg total) by mouth at bedtime. 30 capsule 5  ? Olmesartan-amLODIPine-HCTZ 40-10-25 MG TABS Take 1 tablet by mouth daily. 30 tablet 0  ? triamcinolone cream (KENALOG) 0.1 % Apply 1 application topically 2 (two) times daily. 30 g 0  ? pantoprazole  (PROTONIX) 40 MG tablet TAKE 1 TABLET BY MOUTH ONCE A DAY 90 tablet 3  ? ?No current facility-administered medications on file prior to visit.  ? ? ?BP 126/90   Pulse 73   Temp 98.4 ?F (36.9 ?C) (Oral)   Ht 5\' 7"  (1.702 m)   Wt 193 lb (87.5 kg)   SpO2 97%   BMI 30.23 kg/m?  ? ? ?   ?Objective:  ?  ?   ? ? ? ? ?Assessment & Plan:  ? ? ?

## 2021-06-17 ENCOUNTER — Other Ambulatory Visit (HOSPITAL_COMMUNITY): Payer: Self-pay

## 2021-06-17 ENCOUNTER — Other Ambulatory Visit: Payer: Self-pay | Admitting: Adult Health

## 2021-06-17 DIAGNOSIS — K219 Gastro-esophageal reflux disease without esophagitis: Secondary | ICD-10-CM

## 2021-06-17 LAB — HIV ANTIBODY (ROUTINE TESTING W REFLEX): HIV 1&2 Ab, 4th Generation: NONREACTIVE

## 2021-06-17 MED ORDER — PANTOPRAZOLE SODIUM 40 MG PO TBEC
DELAYED_RELEASE_TABLET | Freq: Every day | ORAL | 3 refills | Status: DC
  Filled 2021-06-17: qty 90, 90d supply, fill #0
  Filled 2021-09-29: qty 90, 90d supply, fill #1
  Filled 2022-04-13: qty 90, 90d supply, fill #2

## 2021-06-18 ENCOUNTER — Other Ambulatory Visit (HOSPITAL_COMMUNITY): Payer: Self-pay

## 2021-06-18 NOTE — Patient Instructions (Signed)
Health Maintenance Due  ?Topic Date Due  ? COLONOSCOPY (Pts 45-18yrs Insurance coverage will need to be confirmed)  Never done  ? COVID-19 Vaccine (5 - Booster) 03/14/2020  ? ? ? ? Row Labels 06/16/2021  ?  7:02 AM 01/09/2021  ? 11:58 AM  ?Depression screen PHQ 2/9   Section Header. No data exists in this row.    ?Decreased Interest   0 0  ?Down, Depressed, Hopeless   1 1  ?PHQ - 2 Score   1 1  ?Altered sleeping   0 1  ?Tired, decreased energy   0 1  ?Change in appetite   0 2  ?Feeling bad or failure about yourself    0 0  ?Trouble concentrating   0 1  ?Moving slowly or fidgety/restless   0 0  ?Suicidal thoughts   0 0  ?PHQ-9 Score   1 6  ?Difficult doing work/chores   Not difficult at all   ? ? ?
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
## 2021-08-14 ENCOUNTER — Other Ambulatory Visit (HOSPITAL_COMMUNITY): Payer: Self-pay

## 2021-09-14 ENCOUNTER — Telehealth: Payer: Self-pay | Admitting: Adult Health

## 2021-09-14 NOTE — Telephone Encounter (Signed)
Pt called stating she would like to make an appt with Dr. Carmelia Roller to talk about weight loss. After reviewing chart, noticed that she is established with [facility] and asked why she was looking to have the appt with Dr. Carmelia Roller. She stated that [facility] does not do weight loss and Dr. Carmelia Roller does. Pt was advised that a note would have to be sent to make sure this appt would be ok as she is established with a different practice. Pt acknowledged understanding.

## 2021-09-24 ENCOUNTER — Other Ambulatory Visit (HOSPITAL_COMMUNITY): Payer: Self-pay

## 2021-09-24 ENCOUNTER — Other Ambulatory Visit: Payer: Self-pay

## 2021-09-24 MED ORDER — SEMAGLUTIDE-WEIGHT MANAGEMENT 0.25 MG/0.5ML ~~LOC~~ SOAJ
SUBCUTANEOUS | 1 refills | Status: DC
  Filled 2021-09-24: qty 2, 28d supply, fill #0

## 2021-09-24 MED ORDER — WEGOVY 0.25 MG/0.5ML ~~LOC~~ SOAJ
SUBCUTANEOUS | 1 refills | Status: DC
  Filled 2021-09-24: qty 2, 28d supply, fill #0

## 2021-09-26 ENCOUNTER — Other Ambulatory Visit (HOSPITAL_COMMUNITY): Payer: Self-pay

## 2021-09-29 ENCOUNTER — Other Ambulatory Visit (HOSPITAL_COMMUNITY): Payer: Self-pay

## 2021-10-02 ENCOUNTER — Other Ambulatory Visit (HOSPITAL_COMMUNITY): Payer: Self-pay

## 2021-10-03 ENCOUNTER — Other Ambulatory Visit (HOSPITAL_COMMUNITY): Payer: Self-pay

## 2021-10-06 ENCOUNTER — Other Ambulatory Visit (HOSPITAL_COMMUNITY): Payer: Self-pay

## 2021-10-06 MED ORDER — WEGOVY 0.5 MG/0.5ML ~~LOC~~ SOAJ
SUBCUTANEOUS | 1 refills | Status: DC
  Filled 2021-10-06: qty 2, 28d supply, fill #0

## 2021-10-09 ENCOUNTER — Other Ambulatory Visit (HOSPITAL_COMMUNITY): Payer: Self-pay

## 2021-10-14 ENCOUNTER — Other Ambulatory Visit (HOSPITAL_COMMUNITY): Payer: Self-pay

## 2021-10-26 ENCOUNTER — Other Ambulatory Visit (HOSPITAL_COMMUNITY): Payer: Self-pay

## 2021-10-26 MED ORDER — WEGOVY 1 MG/0.5ML ~~LOC~~ SOAJ
1.0000 mg | SUBCUTANEOUS | 1 refills | Status: DC
  Filled 2021-10-26: qty 2, 28d supply, fill #0
  Filled 2021-11-13 – 2021-11-16 (×2): qty 2, 28d supply, fill #1

## 2021-11-06 ENCOUNTER — Other Ambulatory Visit (HOSPITAL_COMMUNITY): Payer: Self-pay

## 2021-11-14 ENCOUNTER — Other Ambulatory Visit (HOSPITAL_COMMUNITY): Payer: Self-pay

## 2021-11-17 ENCOUNTER — Other Ambulatory Visit (HOSPITAL_COMMUNITY): Payer: Self-pay

## 2021-11-18 ENCOUNTER — Other Ambulatory Visit (HOSPITAL_COMMUNITY): Payer: Self-pay

## 2021-12-18 ENCOUNTER — Other Ambulatory Visit (HOSPITAL_COMMUNITY): Payer: Self-pay

## 2021-12-18 MED ORDER — WEGOVY 1.7 MG/0.75ML ~~LOC~~ SOAJ
1.7000 mg | SUBCUTANEOUS | 1 refills | Status: DC
  Filled 2021-12-18: qty 3, 28d supply, fill #0
  Filled 2022-02-24: qty 3, 28d supply, fill #1

## 2021-12-21 ENCOUNTER — Other Ambulatory Visit (HOSPITAL_COMMUNITY): Payer: Self-pay
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
## 2021-12-24 ENCOUNTER — Other Ambulatory Visit (HOSPITAL_COMMUNITY): Payer: Self-pay

## 2021-12-24 ENCOUNTER — Ambulatory Visit (INDEPENDENT_AMBULATORY_CARE_PROVIDER_SITE_OTHER): Payer: BC Managed Care – PPO | Admitting: Sports Medicine

## 2021-12-24 ENCOUNTER — Ambulatory Visit: Payer: Self-pay

## 2021-12-24 VITALS — BP 124/78 | HR 78 | Ht 67.0 in | Wt 191.0 lb

## 2021-12-24 DIAGNOSIS — G5603 Carpal tunnel syndrome, bilateral upper limbs: Secondary | ICD-10-CM

## 2021-12-24 MED ORDER — IBUPROFEN 800 MG PO TABS
800.0000 mg | ORAL_TABLET | Freq: Three times a day (TID) | ORAL | 3 refills | Status: DC | PRN
  Filled 2022-10-18: qty 30, 10d supply, fill #0

## 2021-12-24 NOTE — Patient Instructions (Signed)
Good to see you 2 week follow up  

## 2021-12-28 ENCOUNTER — Other Ambulatory Visit (HOSPITAL_COMMUNITY): Payer: Self-pay

## 2021-12-29 ENCOUNTER — Other Ambulatory Visit (HOSPITAL_COMMUNITY): Payer: Self-pay

## 2022-01-15 ENCOUNTER — Other Ambulatory Visit (HOSPITAL_COMMUNITY): Payer: Self-pay

## 2022-01-16 ENCOUNTER — Other Ambulatory Visit (HOSPITAL_COMMUNITY): Payer: Self-pay

## 2022-01-18 ENCOUNTER — Other Ambulatory Visit (HOSPITAL_COMMUNITY): Payer: Self-pay

## 2022-01-20 NOTE — Progress Notes (Deleted)
[facility] Phone: [phone] Subjective:    I'm seeing this patient by the request  of:  Dorothyann Peng, NP  CC: Bilateral hand pain right greater than left  [id number]  Valerie Gilmore is a 51 y.o. female coming in with complaint of B hand pain, R>L. B carpal tunnel injections by Dr. Glennon Mac on 12/24/2021. Patient states    Patient did have a nerve conduction study showing that there is severe bilateral carpal tunnel syndrome back in 2017.    Past Medical History:  Diagnosis Date   Depression    Hypertension    high blood pressure readings    Migraine    Past Surgical History:  Procedure Laterality Date   ABDOMINAL HYSTERECTOMY  2011   Social History   Socioeconomic History   Marital status: Single    Spouse name: Not on file   Number of children: Not on file   Years of education: Not on file   Highest education level: Not on file  Occupational History   Not on file  Tobacco Use   Smoking status: Never   Smokeless tobacco: Never  Vaping Use   Vaping Use: Every day  Substance and Sexual Activity   Alcohol use: Yes    Comment: rare   Drug use: No   Sexual activity: Not on file  Other Topics Concern   Not on file  Social History Narrative   Right handed   Social Determinants of Health   Financial Resource Strain: Not on file  Food Insecurity: Not on file  Transportation Needs: Not on file  Physical Activity: Not on file  Stress: Not on file  Social Connections: Not on file   Allergies  Allergen Reactions   Sulfa Antibiotics Other (See Comments)    Hallucinations    Other Rash    Powder gloves cause a rash   Family History  Problem Relation Age of Onset   Breast cancer Paternal Grandmother    Prostate cancer Father    Hypertension Other        both sides     Current Outpatient Medications (Endocrine & Metabolic):    metFORMIN (GLUCOPHAGE) 500 MG tablet, Take 1 tablet  (500 mg total) by mouth daily with breakfast.  Current Outpatient Medications (Cardiovascular):    Olmesartan-amLODIPine-HCTZ 40-10-25 MG TABS, Take 1 tablet by mouth daily.   Current Outpatient Medications (Analgesics):    ibuprofen (IBU) 800 MG tablet, Take 1 tablet (800 mg total) by mouth every 8 (eight) hours as needed.   ibuprofen (IBU) 800 MG tablet, Take 1 tablet (800 mg total) by mouth every 8 (eight) hours as needed.   Current Outpatient Medications (Other):    pantoprazole (PROTONIX) 40 MG tablet, TAKE 1 TABLET BY MOUTH ONCE A DAY   Semaglutide-Weight Management (WEGOVY) 0.5 MG/0.5ML SOAJ, Inject 0.5 mg into the skin once a week   Semaglutide-Weight Management (WEGOVY) 1 MG/0.5ML SOAJ, Inject 1 mg as directed once a week.   Semaglutide-Weight Management (WEGOVY) 1.7 MG/0.75ML SOAJ, Inject 1.7 mg into the skin once a week.   Semaglutide-Weight Management 0.25 MG/0.5ML SOAJ, Inject 1 pen into the skin once a week   triamcinolone cream (KENALOG) 0.1 %, Apply 1 application topically 2 (two) times daily.   Reviewed prior external information including notes and imaging from  primary care provider As well as notes that were available from care everywhere and other healthcare systems.  Past medical history, social, surgical  and family history all reviewed in electronic medical record.  No pertanent information unless stated regarding to the chief complaint.   Review of Systems:  No headache, visual changes, nausea, vomiting, diarrhea, constipation, dizziness, abdominal pain, skin rash, fevers, chills, night sweats, weight loss, swollen lymph nodes, body aches, joint swelling, chest pain, shortness of breath, mood changes. POSITIVE muscle aches  Objective  There were no vitals taken for this visit.   General: No apparent distress alert and oriented x3 mood and affect normal, dressed appropriately.  HEENT: Pupils equal, extraocular movements intact  Respiratory: Patient's speak in  full sentences and does not appear short of breath  Cardiovascular: No lower extremity edema, non tender, no erythema      Impression and Recommendations:    The above documentation has been reviewed and is accurate and complete Judi Saa, DO

## 2022-01-23 ENCOUNTER — Other Ambulatory Visit (HOSPITAL_COMMUNITY): Payer: Self-pay

## 2022-01-25 ENCOUNTER — Ambulatory Visit: Payer: BC Managed Care – PPO | Admitting: Family Medicine

## 2022-03-17 ENCOUNTER — Other Ambulatory Visit (HOSPITAL_COMMUNITY): Payer: Self-pay

## 2022-03-17 MED ORDER — WEGOVY 2.4 MG/0.75ML ~~LOC~~ SOAJ
2.4000 mg | SUBCUTANEOUS | 1 refills | Status: DC
  Filled 2022-03-17: qty 3, 28d supply, fill #0
  Filled 2022-05-22: qty 3, 28d supply, fill #1

## 2022-04-03 ENCOUNTER — Other Ambulatory Visit (HOSPITAL_COMMUNITY): Payer: Self-pay

## 2022-04-14 ENCOUNTER — Other Ambulatory Visit: Payer: Self-pay

## 2022-04-14 ENCOUNTER — Other Ambulatory Visit (HOSPITAL_COMMUNITY): Payer: Self-pay

## 2022-04-14 MED ORDER — WEGOVY 1.7 MG/0.75ML ~~LOC~~ SOAJ
1.7000 mg | SUBCUTANEOUS | 0 refills | Status: DC
  Filled 2022-04-14: qty 3, 30d supply, fill #0
  Filled 2022-04-26: qty 3, 28d supply, fill #0
  Filled 2022-04-26: qty 9, 84d supply, fill #0

## 2022-04-16 ENCOUNTER — Other Ambulatory Visit (HOSPITAL_COMMUNITY): Payer: Self-pay

## 2022-04-26 ENCOUNTER — Other Ambulatory Visit (HOSPITAL_COMMUNITY): Payer: Self-pay

## 2022-05-09 ENCOUNTER — Other Ambulatory Visit (HOSPITAL_COMMUNITY): Payer: Self-pay

## 2022-05-15 ENCOUNTER — Other Ambulatory Visit (HOSPITAL_COMMUNITY): Payer: Self-pay

## 2022-05-17 ENCOUNTER — Other Ambulatory Visit: Payer: Self-pay

## 2022-05-23 ENCOUNTER — Other Ambulatory Visit (HOSPITAL_COMMUNITY): Payer: Self-pay

## 2022-05-24 ENCOUNTER — Other Ambulatory Visit (HOSPITAL_COMMUNITY): Payer: Self-pay

## 2022-05-24 ENCOUNTER — Other Ambulatory Visit: Payer: Self-pay

## 2022-07-28 ENCOUNTER — Ambulatory Visit (INDEPENDENT_AMBULATORY_CARE_PROVIDER_SITE_OTHER): Payer: BC Managed Care – PPO | Admitting: Adult Health

## 2022-07-28 ENCOUNTER — Other Ambulatory Visit (HOSPITAL_COMMUNITY): Payer: Self-pay

## 2022-07-28 ENCOUNTER — Other Ambulatory Visit: Payer: Self-pay | Admitting: Adult Health

## 2022-07-28 ENCOUNTER — Encounter: Payer: Self-pay | Admitting: Adult Health

## 2022-07-28 VITALS — BP 130/86 | HR 70 | Temp 98.6°F | Ht 67.0 in | Wt 187.0 lb

## 2022-07-28 DIAGNOSIS — K219 Gastro-esophageal reflux disease without esophagitis: Secondary | ICD-10-CM

## 2022-07-28 DIAGNOSIS — Z1211 Encounter for screening for malignant neoplasm of colon: Secondary | ICD-10-CM

## 2022-07-28 DIAGNOSIS — Z0000 Encounter for general adult medical examination without abnormal findings: Secondary | ICD-10-CM

## 2022-07-28 DIAGNOSIS — R7303 Prediabetes: Secondary | ICD-10-CM

## 2022-07-28 DIAGNOSIS — I10 Essential (primary) hypertension: Secondary | ICD-10-CM | POA: Diagnosis not present

## 2022-07-28 MED ORDER — OLMESARTAN-AMLODIPINE-HCTZ 40-10-25 MG PO TABS
1.0000 | ORAL_TABLET | Freq: Every day | ORAL | 3 refills | Status: DC
  Filled 2022-07-28: qty 90, 90d supply, fill #0
  Filled 2022-11-10: qty 90, 90d supply, fill #1
  Filled 2023-02-22: qty 90, 90d supply, fill #2
  Filled 2023-06-10: qty 90, 90d supply, fill #3

## 2022-07-28 MED ORDER — PANTOPRAZOLE SODIUM 40 MG PO TBEC
DELAYED_RELEASE_TABLET | Freq: Every day | ORAL | 3 refills | Status: AC
  Filled 2022-07-28: qty 90, 90d supply, fill #0
  Filled 2022-11-10: qty 90, 90d supply, fill #1

## 2022-07-28 NOTE — Progress Notes (Signed)
Subjective:    Patient ID: Valerie Gilmore, female    DOB: May 29, 1970, 52 y.o.   MRN: [id number]  HPI Patient presents for yearly preventative medicine examination. She is a 52 year old female who  has a past medical history of Depression, Hypertension, and Migraine.  Hypertension - takes Tribenzor 05-19-23 mg daily. She denies side effects of dizziness, lightheadedness, blurred vision, or syncopal episodes.  BP Readings from Last 3 Encounters:  07/28/22 130/86  12/24/21 124/78  06/16/21 126/90   Pre diabetes - She is no longer taking Metformin. She was on Wegovy through a weight loss clinic but this was stopped a few months ago when her insurance stopped covering it  Lab Results  Component Value Date   HGBA1C 6.3 03/26/2020   GERD - Takes Protonix 40 mg daily - eels controlled.    All immunizations and health maintenance protocols were reviewed with the patient and needed orders were placed.  Appropriate screening laboratory values were ordered for the patient including screening of hyperlipidemia, renal function and hepatic function.  Medication reconciliation,  past medical history, social history, problem list and allergies were reviewed in detail with the patient  Goals were established with regard to weight loss, exercise, and  diet in compliance with medications Wt Readings from Last 3 Encounters:  07/28/22 187 lb (84.8 kg)  12/24/21 191 lb (86.6 kg)  06/16/21 193 lb (87.5 kg)   She is due for colon cancer screening and mammogram  Review of Systems  Constitutional: Negative.   HENT: Negative.    Eyes: Negative.   Respiratory: Negative.    Cardiovascular: Negative.   Gastrointestinal: Negative.   Endocrine: Negative.   Genitourinary: Negative.   Musculoskeletal: Negative.   Skin: Negative.   Allergic/Immunologic: Negative.   Neurological: Negative.   Hematological: Negative.   Psychiatric/Behavioral: Negative.     Past Medical History:  Diagnosis Date    Depression    Hypertension    high blood pressure readings    Migraine     Social History   Socioeconomic History   Marital status: Single    Spouse name: Not on file   Number of children: Not on file   Years of education: Not on file   Highest education level: Not on file  Occupational History   Not on file  Tobacco Use   Smoking status: Never   Smokeless tobacco: Never  Vaping Use   Vaping Use: Every day  Substance and Sexual Activity   Alcohol use: Yes    Comment: rare   Drug use: No   Sexual activity: Not on file  Other Topics Concern   Not on file  Social History Narrative   Right handed   Social Determinants of Health   Financial Resource Strain: Not on file  Food Insecurity: Not on file  Transportation Needs: Not on file  Physical Activity: Not on file  Stress: Not on file  Social Connections: Not on file  Intimate Partner Violence: Not on file    Past Surgical History:  Procedure Laterality Date   ABDOMINAL HYSTERECTOMY  2011    Family History  Problem Relation Age of Onset   Breast cancer Paternal Grandmother    Prostate cancer Father    Hypertension Other        both sides     Allergies  Allergen Reactions   Sulfa Antibiotics Other (See Comments)    Hallucinations    Other Rash    Powder gloves cause a rash  Current Outpatient Medications on File Prior to Visit  Medication Sig Dispense Refill   ibuprofen (IBU) 800 MG tablet Take 1 tablet (800 mg total) by mouth every 8 (eight) hours as needed. 30 tablet 3   ibuprofen (IBU) 800 MG tablet Take 1 tablet (800 mg total) by mouth every 8 (eight) hours as needed. 30 tablet 3   Olmesartan-amLODIPine-HCTZ 40-10-25 MG TABS Take 1 tablet by mouth daily. 90 tablet 3   pantoprazole (PROTONIX) 40 MG tablet TAKE 1 TABLET BY MOUTH ONCE A DAY 90 tablet 3   metFORMIN (GLUCOPHAGE) 500 MG tablet Take 1 tablet (500 mg total) by mouth daily with breakfast. 90 tablet 3   No current facility-administered  medications on file prior to visit.    BP 130/86   Pulse 70   Temp 98.6 F (37 C) (Oral)   Ht 5\' 7"  (1.702 m)   Wt 187 lb (84.8 kg)   SpO2 95%   BMI 29.29 kg/m       Objective:   Physical Exam Vitals and nursing note reviewed.  Constitutional:      General: She is not in acute distress.    Appearance: Normal appearance. She is not ill-appearing.  HENT:     Head: Normocephalic and atraumatic.     Right Ear: Tympanic membrane, ear canal and external ear normal. There is no impacted cerumen.     Left Ear: Tympanic membrane, ear canal and external ear normal. There is no impacted cerumen.     Nose: Nose normal. No congestion or rhinorrhea.     Mouth/Throat:     Mouth: Mucous membranes are moist.     Pharynx: Oropharynx is clear.  Eyes:     Extraocular Movements: Extraocular movements intact.     Conjunctiva/sclera: Conjunctivae normal.     Pupils: Pupils are equal, round, and reactive to light.  Neck:     Vascular: No carotid bruit.  Cardiovascular:     Rate and Rhythm: Normal rate and regular rhythm.     Pulses: Normal pulses.     Heart sounds: No murmur heard.    No friction rub. No gallop.  Pulmonary:     Effort: Pulmonary effort is normal.     Breath sounds: Normal breath sounds.  Abdominal:     General: Abdomen is flat. Bowel sounds are normal. There is no distension.     Palpations: Abdomen is soft. There is no mass.     Tenderness: There is no abdominal tenderness. There is no guarding or rebound.     Hernia: No hernia is present.  Musculoskeletal:        General: Normal range of motion.     Cervical back: Normal range of motion and neck supple.  Lymphadenopathy:     Cervical: No cervical adenopathy.  Skin:    General: Skin is warm and dry.     Capillary Refill: Capillary refill takes less than 2 seconds.  Neurological:     General: No focal deficit present.     Mental Status: She is alert and oriented to person, place, and time.  Psychiatric:         Mood and Affect: Mood normal.        Behavior: Behavior normal.        Thought Content: Thought content normal.        Judgment: Judgment normal.       Assessment & Plan:  1. Routine general medical examination at a health care facility Today patient counseled  on age appropriate routine health concerns for screening and prevention, each reviewed and up to date or declined. Immunizations reviewed and up to date or declined. Labs ordered and reviewed. Risk factors for depression reviewed and negative. Hearing function and visual acuity are intact. ADLs screened and addressed as needed. Functional ability and level of safety reviewed and appropriate. Education, counseling and referrals performed based on assessed risks today. Patient provided with a copy of personalized plan for preventive services. - Encouraged to schedule mammogram  - Work on lifestyle modifications  - Follow up in one year or sooner if needed  2. Gastroesophageal reflux disease without esophagitis  - pantoprazole (PROTONIX) 40 MG tablet; TAKE 1 TABLET BY MOUTH ONCE A DAY  Dispense: 90 tablet; Refill: 3  3. Essential hypertension - Well controlled. No change in medication  - CBC with Differential/Platelet; Future - Comprehensive metabolic panel; Future - Hemoglobin A1c; Future - TSH; Future - Lipid panel; Future  4. Pre-diabetes - Consider placing on Metformin  - CBC with Differential/Platelet; Future - Comprehensive metabolic panel; Future - Hemoglobin A1c; Future - TSH; Future - Lipid panel; Future  5. Colon cancer screening  - Cologuard  Shirline Frees, NP

## 2022-07-28 NOTE — Patient Instructions (Signed)
It was great seeing you today   Please schedule a follow up for your physical labs

## 2022-07-29 ENCOUNTER — Other Ambulatory Visit (HOSPITAL_COMMUNITY): Payer: Self-pay

## 2022-07-30 ENCOUNTER — Other Ambulatory Visit (INDEPENDENT_AMBULATORY_CARE_PROVIDER_SITE_OTHER): Payer: BC Managed Care – PPO

## 2022-07-30 DIAGNOSIS — R7303 Prediabetes: Secondary | ICD-10-CM

## 2022-07-30 DIAGNOSIS — I10 Essential (primary) hypertension: Secondary | ICD-10-CM

## 2022-07-30 DIAGNOSIS — Z0000 Encounter for general adult medical examination without abnormal findings: Secondary | ICD-10-CM

## 2022-07-30 LAB — CBC WITH DIFFERENTIAL/PLATELET
Basophils Absolute: 0 10*3/uL (ref 0.0–0.1)
Basophils Relative: 0.5 % (ref 0.0–3.0)
Eosinophils Absolute: 0.1 10*3/uL (ref 0.0–0.7)
Eosinophils Relative: 1.4 % (ref 0.0–5.0)
HCT: 42.2 % (ref 36.0–46.0)
Hemoglobin: 14.2 g/dL (ref 12.0–15.0)
Lymphocytes Relative: 61.4 % — ABNORMAL HIGH (ref 12.0–46.0)
Lymphs Abs: 2.9 10*3/uL (ref 0.7–4.0)
MCHC: 33.5 g/dL (ref 30.0–36.0)
MCV: 83 fl (ref 78.0–100.0)
Monocytes Absolute: 0.6 10*3/uL (ref 0.1–1.0)
Monocytes Relative: 11.6 % (ref 3.0–12.0)
Neutro Abs: 1.2 10*3/uL — ABNORMAL LOW (ref 1.4–7.7)
Neutrophils Relative %: 25.1 % — ABNORMAL LOW (ref 43.0–77.0)
Platelets: 365 10*3/uL (ref 150.0–400.0)
RBC: 5.09 Mil/uL (ref 3.87–5.11)
RDW: 13.8 % (ref 11.5–15.5)
WBC: 4.8 10*3/uL (ref 4.0–10.5)

## 2022-07-30 LAB — COMPREHENSIVE METABOLIC PANEL
ALT: 25 U/L (ref 0–35)
AST: 21 U/L (ref 0–37)
Albumin: 4.2 g/dL (ref 3.5–5.2)
Alkaline Phosphatase: 36 U/L — ABNORMAL LOW (ref 39–117)
BUN: 15 mg/dL (ref 6–23)
CO2: 27 mEq/L (ref 19–32)
Calcium: 9.4 mg/dL (ref 8.4–10.5)
Chloride: 100 mEq/L (ref 96–112)
Creatinine, Ser: 0.9 mg/dL (ref 0.40–1.20)
GFR: 75.05 mL/min (ref >60.00)
Glucose, Bld: 100 mg/dL — ABNORMAL HIGH (ref 70–99)
Potassium: 3.4 mEq/L — ABNORMAL LOW (ref 3.5–5.1)
Sodium: 137 mEq/L (ref 135–145)
Total Bilirubin: 0.4 mg/dL (ref 0.2–1.2)
Total Protein: 7.7 g/dL (ref 6.0–8.3)

## 2022-07-30 LAB — LIPID PANEL
Cholesterol: 188 mg/dL (ref 0–200)
HDL: 34.9 mg/dL — ABNORMAL LOW (ref >39.00)
NonHDL: 152.91
Total CHOL/HDL Ratio: 5
Triglycerides: 262 mg/dL — ABNORMAL HIGH (ref 0.0–149.0)
VLDL: 52.4 mg/dL — ABNORMAL HIGH (ref 0.0–40.0)

## 2022-07-30 LAB — TSH: TSH: 1.61 u[IU]/mL (ref 0.35–5.50)

## 2022-07-30 LAB — LDL CHOLESTEROL, DIRECT: Direct LDL: 130 mg/dL

## 2022-07-30 LAB — HEMOGLOBIN A1C: Hgb A1c MFr Bld: 5.7 % (ref 4.6–6.5)

## 2022-08-02 ENCOUNTER — Other Ambulatory Visit (HOSPITAL_COMMUNITY): Payer: Self-pay

## 2022-08-03 ENCOUNTER — Other Ambulatory Visit: Payer: Self-pay | Admitting: Adult Health

## 2022-08-03 DIAGNOSIS — R7303 Prediabetes: Secondary | ICD-10-CM

## 2022-08-03 NOTE — Telephone Encounter (Signed)
Pre diabetes - She is no longer taking Metformin. She was on Wegovy through a weight loss clinic but this was stopped a few months ago when her insurance stopped covering it  Please advise if this is the dose pt needs to be on or if she needs to be on this Rx

## 2022-08-03 NOTE — Telephone Encounter (Signed)
Noted  

## 2022-08-05 ENCOUNTER — Other Ambulatory Visit (HOSPITAL_COMMUNITY): Payer: Self-pay

## 2022-08-09 NOTE — Progress Notes (Unsigned)
[name] [address] [place] [address] Phone: [phone] Subjective:   Bruce Donath, am serving as a scribe for Dr. Antoine Primas.  I'm seeing this patient by the request  of:  Shirline Frees, NP  CC: Bilateral wrist  [id number]  01/22/2019 Patient responded well to the injection and had near complete resolution of pain almost immediately after the injection. Patient does have significant dilation of the median nerve and will need to consider the possibility of a surgical intervention at some point. Patient at this point would like to avoid it. Patient will increase activity slowly over the course the next several weeks. Follow-up with me again in 4 to 8 weeks   Updated 08/11/2022 Valerie Gilmore is a 52 y.o. female coming in with complaint of B carpal tunnel. Has been seeing Dr. Jean Rosenthal.  Last injections in November 2023. Patient is having wrist pain in both wrists with R>L. At night the tingling will extend into her fingers. Tingling in wrist during the day.   Patient is also complaining of some of the back pain.  Has had back pain previously and seems to be worsening.  Affecting daily activities.  Describes it as a dull, throbbing aching discomfort.  Patient did have carpal tunnel syndrome diagnosed on nerve conduction study in 2017.  This was moderate to severe even at that time    Past Medical History:  Diagnosis Date   Depression    Hypertension    high blood pressure readings    Migraine    Past Surgical History:  Procedure Laterality Date   ABDOMINAL HYSTERECTOMY  2011   Social History   Socioeconomic History   Marital status: Single    Spouse name: Not on file   Number of children: Not on file   Years of education: Not on file   Highest education level: Not on file  Occupational History   Not on file  Tobacco Use   Smoking status: Never   Smokeless tobacco: Never  Vaping Use   Vaping Use: Every day  Substance  and Sexual Activity   Alcohol use: Yes    Comment: rare   Drug use: No   Sexual activity: Not on file  Other Topics Concern   Not on file  Social History Narrative   Right handed   Social Determinants of Health   Financial Resource Strain: Not on file  Food Insecurity: Not on file  Transportation Needs: Not on file  Physical Activity: Not on file  Stress: Not on file  Social Connections: Not on file   Allergies  Allergen Reactions   Sulfa Antibiotics Other (See Comments)    Hallucinations    Other Rash    Powder gloves cause a rash   Family History  Problem Relation Age of Onset   Breast cancer Paternal Grandmother    Prostate cancer Father    Hypertension Other        both sides     Current Outpatient Medications (Endocrine & Metabolic):    metFORMIN (GLUCOPHAGE) 500 MG tablet, Take 1 tablet (500 mg total) by mouth daily with breakfast.  Current Outpatient Medications (Cardiovascular):    Olmesartan-amLODIPine-HCTZ 40-10-25 MG TABS, Take 1 tablet by mouth daily.   Current Outpatient Medications (Analgesics):    ibuprofen (IBU) 800 MG tablet, Take 1 tablet (800 mg total) by mouth every 8 (eight) hours as needed.   ibuprofen (IBU) 800 MG tablet, Take 1 tablet (800 mg total) by  mouth every 8 (eight) hours as needed.   Current Outpatient Medications (Other):    pantoprazole (PROTONIX) 40 MG tablet, TAKE 1 TABLET BY MOUTH ONCE A DAY   Reviewed prior external information including notes and imaging from  primary care provider As well as notes that were available from care everywhere and other healthcare systems.  Past medical history, social, surgical and family history all reviewed in electronic medical record.  No pertanent information unless stated regarding to the chief complaint.   Review of Systems:  No headache, visual changes, nausea, vomiting, diarrhea, constipation, dizziness, abdominal pain, skin rash, fevers, chills, night sweats, weight loss, swollen  lymph nodes, body aches, joint swelling, chest pain, shortness of breath, mood changes. POSITIVE muscle aches  Objective  There were no vitals taken for this visit.   General: No apparent distress alert and oriented x3 mood and affect normal, dressed appropriately.  HEENT: Pupils equal, extraocular movements intact  Respiratory: Patient's speak in full sentences and does not appear short of breath  Cardiovascular: No lower extremity edema, non tender, no erythema  Low back exam does have some loss lordosis noted.  Some tenderness to palpation in the paraspinal musculature.  Tightness with Pearlean Brownie right greater than left.  Negative straight leg test.  Right hand exam shows positive Tinel's noted.  Good grip strength noted.  No thenar eminence wasting noted.  Procedure: Real-time Ultrasound Guided Injection of right carpal tunnel Device: GE Logiq Q7  Ultrasound guided injection is preferred based studies that show increased duration, increased effect, greater accuracy, decreased procedural pain, increased response rate with ultrasound guided versus blind injection.  Verbal informed consent obtained.  Time-out conducted.  Noted no overlying erythema, induration, or other signs of local infection.  Skin prepped in a sterile fashion.  Local anesthesia: Topical Ethyl chloride.  With sterile technique and under real time ultrasound guidance:  median nerve visualized.  23g 5/8 inch needle inserted distal to proximal approach into nerve sheath. Pictures taken nfor needle placement. Patient did have injection of 2 cc of 1% lidocaine, 0.5 cc of 0.5% Marcaine, and 0.5 cc of Kenalog 40 mg/dL. Completed without difficulty  Pain immediately improved suggesting accurate placement of the medication.  Advised to call if fevers/chills, erythema, induration, drainage, or persistent bleeding.  Impression: Technically successful ultrasound guided injection.   Osteopathic findings  T11 extended rotated and side  bent left L1 flexed rotated and side bent right Sacrum right on right    Impression and Recommendations:    The above documentation has been reviewed and is accurate and complete Judi Saa, DO

## 2022-08-11 ENCOUNTER — Ambulatory Visit (INDEPENDENT_AMBULATORY_CARE_PROVIDER_SITE_OTHER): Payer: BC Managed Care – PPO

## 2022-08-11 ENCOUNTER — Ambulatory Visit: Payer: BC Managed Care – PPO | Admitting: Family Medicine

## 2022-08-11 ENCOUNTER — Encounter: Payer: Self-pay | Admitting: Family Medicine

## 2022-08-11 ENCOUNTER — Other Ambulatory Visit: Payer: Self-pay

## 2022-08-11 VITALS — BP 118/84 | HR 83 | Ht 67.0 in | Wt 189.0 lb

## 2022-08-11 DIAGNOSIS — M545 Low back pain, unspecified: Secondary | ICD-10-CM | POA: Diagnosis not present

## 2022-08-11 DIAGNOSIS — M9903 Segmental and somatic dysfunction of lumbar region: Secondary | ICD-10-CM

## 2022-08-11 DIAGNOSIS — M9904 Segmental and somatic dysfunction of sacral region: Secondary | ICD-10-CM | POA: Diagnosis not present

## 2022-08-11 DIAGNOSIS — M9902 Segmental and somatic dysfunction of thoracic region: Secondary | ICD-10-CM | POA: Diagnosis not present

## 2022-08-11 DIAGNOSIS — M25531 Pain in right wrist: Secondary | ICD-10-CM

## 2022-08-11 DIAGNOSIS — M5137 Other intervertebral disc degeneration, lumbosacral region: Secondary | ICD-10-CM | POA: Diagnosis not present

## 2022-08-11 DIAGNOSIS — G5603 Carpal tunnel syndrome, bilateral upper limbs: Secondary | ICD-10-CM

## 2022-08-11 DIAGNOSIS — M25532 Pain in left wrist: Secondary | ICD-10-CM

## 2022-08-11 NOTE — Assessment & Plan Note (Signed)

## 2022-08-11 NOTE — Assessment & Plan Note (Signed)
Known degenerative disc disease.  Will get further evaluation with x-ray today.  We discussed different medications but wants to try home exercises first.  Work with Event organiser.  If worsening symptoms can consider advanced imaging but hopeful that this as well as osteopathic manipulation can be beneficial.  Follow-up again in 2 months

## 2022-08-11 NOTE — Assessment & Plan Note (Signed)
Patient has done relatively well for multiple years.  Last injection was only 8 months ago.  Still does not want to do any true surgical intervention.  I still do not see any thenar eminence wasting that makes me concerned for more worsening nerve involvement.  Discussed bracing at night.  Follow-up again in 6 to 8 weeks

## 2022-08-11 NOTE — Patient Instructions (Addendum)
Xray today Exercises We injected R wrist Can do L next visit if needed See me again in 6 weeks

## 2022-09-03 ENCOUNTER — Ambulatory Visit: Payer: BC Managed Care – PPO | Admitting: Family Medicine

## 2022-09-22 NOTE — Progress Notes (Deleted)
  [name] [address] [place] [address] Phone: [phone] Subjective:    I'm seeing this patient by the request  of:  Shirline Frees, NP  CC:   [id number]  Valerie Gilmore is a 52 y.o. female coming in with complaint of back and neck pain. OMT on 08/11/2022. Wrist pain as well. Patient states   Medications patient has been prescribed:   Taking:         Reviewed prior external information including notes and imaging from previsou exam, outside providers and external EMR if available.   As well as notes that were available from care everywhere and other healthcare systems.  Past medical history, social, surgical and family history all reviewed in electronic medical record.  No pertanent information unless stated regarding to the chief complaint.   Past Medical History:  Diagnosis Date   Depression    Hypertension    high blood pressure readings    Migraine     Allergies  Allergen Reactions   Sulfa Antibiotics Other (See Comments)    Hallucinations    Other Rash    Powder gloves cause a rash     Review of Systems:  No headache, visual changes, nausea, vomiting, diarrhea, constipation, dizziness, abdominal pain, skin rash, fevers, chills, night sweats, weight loss, swollen lymph nodes, body aches, joint swelling, chest pain, shortness of breath, mood changes. POSITIVE muscle aches  Objective  There were no vitals taken for this visit.   General: No apparent distress alert and oriented x3 mood and affect normal, dressed appropriately.  HEENT: Pupils equal, extraocular movements intact  Respiratory: Patient's speak in full sentences and does not appear short of breath  Cardiovascular: No lower extremity edema, non tender, no erythema  Gait MSK:  Back   Osteopathic findings  C2 flexed rotated and side bent right C6 flexed rotated and side bent left T3 extended rotated and side bent right inhaled rib T9 extended  rotated and side bent left L2 flexed rotated and side bent right Sacrum right on right       Assessment and Plan:  No problem-specific Assessment & Plan notes found for this encounter.    Nonallopathic problems  Decision today to treat with OMT was based on Physical Exam  After verbal consent patient was treated with HVLA, ME, FPR techniques in cervical, rib, thoracic, lumbar, and sacral  areas  Patient tolerated the procedure well with improvement in symptoms  Patient given exercises, stretches and lifestyle modifications  See medications in patient instructions if given  Patient will follow up in 4-8 weeks             Note: This dictation was prepared with Dragon dictation along with smaller phrase technology. Any transcriptional errors that result from this process are unintentional.

## 2022-09-23 ENCOUNTER — Ambulatory Visit: Payer: BC Managed Care – PPO | Admitting: Family Medicine

## 2022-10-14 NOTE — Progress Notes (Deleted)
  [name] [address] [place] [address] Phone: [phone] Subjective:    I'm seeing this patient by the request  of:  Shirline Frees, NP  CC:   [id number]  Valerie Gilmore is a 52 y.o. female coming in with complaint of back and neck pain. OMT on 08/11/2022. Also received injection for carpal tunnel last visit. Patient states   Medications patient has been prescribed:   Taking:         Reviewed prior external information including notes and imaging from previsou exam, outside providers and external EMR if available.   As well as notes that were available from care everywhere and other healthcare systems.  Past medical history, social, surgical and family history all reviewed in electronic medical record.  No pertanent information unless stated regarding to the chief complaint.   Past Medical History:  Diagnosis Date   Depression    Hypertension    high blood pressure readings    Migraine     Allergies  Allergen Reactions   Sulfa Antibiotics Other (See Comments)    Hallucinations    Other Rash    Powder gloves cause a rash     Review of Systems:  No headache, visual changes, nausea, vomiting, diarrhea, constipation, dizziness, abdominal pain, skin rash, fevers, chills, night sweats, weight loss, swollen lymph nodes, body aches, joint swelling, chest pain, shortness of breath, mood changes. POSITIVE muscle aches  Objective  There were no vitals taken for this visit.   General: No apparent distress alert and oriented x3 mood and affect normal, dressed appropriately.  HEENT: Pupils equal, extraocular movements intact  Respiratory: Patient's speak in full sentences and does not appear short of breath  Cardiovascular: No lower extremity edema, non tender, no erythema  Gait MSK:  Back   Osteopathic findings  C2 flexed rotated and side bent right C6 flexed rotated and side bent left T3 extended rotated and side bent  right inhaled rib T9 extended rotated and side bent left L2 flexed rotated and side bent right Sacrum right on right       Assessment and Plan:  No problem-specific Assessment & Plan notes found for this encounter.    Nonallopathic problems  Decision today to treat with OMT was based on Physical Exam  After verbal consent patient was treated with HVLA, ME, FPR techniques in cervical, rib, thoracic, lumbar, and sacral  areas  Patient tolerated the procedure well with improvement in symptoms  Patient given exercises, stretches and lifestyle modifications  See medications in patient instructions if given  Patient will follow up in 4-8 weeks             Note: This dictation was prepared with Dragon dictation along with smaller phrase technology. Any transcriptional errors that result from this process are unintentional.

## 2022-10-18 ENCOUNTER — Other Ambulatory Visit (HOSPITAL_COMMUNITY): Payer: Self-pay

## 2022-10-18 ENCOUNTER — Other Ambulatory Visit: Payer: Self-pay | Admitting: Adult Health

## 2022-10-18 DIAGNOSIS — R7303 Prediabetes: Secondary | ICD-10-CM

## 2022-10-19 ENCOUNTER — Other Ambulatory Visit (HOSPITAL_COMMUNITY): Payer: Self-pay

## 2022-10-19 ENCOUNTER — Other Ambulatory Visit: Payer: Self-pay

## 2022-10-19 MED ORDER — METFORMIN HCL 500 MG PO TABS
500.0000 mg | ORAL_TABLET | Freq: Every day | ORAL | 3 refills | Status: DC
Start: 2022-10-19 — End: 2023-11-17
  Filled 2022-10-19: qty 90, 90d supply, fill #0
  Filled 2022-11-10 – 2023-02-22 (×2): qty 90, 90d supply, fill #1

## 2022-10-20 ENCOUNTER — Other Ambulatory Visit (HOSPITAL_COMMUNITY): Payer: Self-pay

## 2022-10-20 ENCOUNTER — Ambulatory Visit: Payer: BC Managed Care – PPO | Admitting: Family Medicine

## 2022-11-10 ENCOUNTER — Other Ambulatory Visit: Payer: Self-pay

## 2022-11-12 ENCOUNTER — Other Ambulatory Visit (HOSPITAL_COMMUNITY): Payer: Self-pay

## 2022-11-19 ENCOUNTER — Ambulatory Visit: Payer: BC Managed Care – PPO | Admitting: Sports Medicine

## 2022-11-19 ENCOUNTER — Other Ambulatory Visit: Payer: Self-pay

## 2022-11-19 VITALS — HR 73 | Ht 67.0 in | Wt 189.0 lb

## 2022-11-19 DIAGNOSIS — M51379 Other intervertebral disc degeneration, lumbosacral region without mention of lumbar back pain or lower extremity pain: Secondary | ICD-10-CM | POA: Diagnosis not present

## 2022-11-19 DIAGNOSIS — M25531 Pain in right wrist: Secondary | ICD-10-CM

## 2022-11-19 DIAGNOSIS — M545 Low back pain, unspecified: Secondary | ICD-10-CM

## 2022-11-19 DIAGNOSIS — G5601 Carpal tunnel syndrome, right upper limb: Secondary | ICD-10-CM

## 2022-11-19 NOTE — Progress Notes (Signed)
Valerie Gilmore D.[facility] [address] [place] [address] Phone: [phone]   Assessment and Plan:     1. Right wrist pain 2. Carpal tunnel syndrome of right wrist -Chronic with exacerbation, subsequent visit - Recurrence of right wrist pain with radicular symptoms in the hand consistent with carpal tunnel - Patient has had moderate relief with carpal tunnel injections in the past with most recent on 08/11/2022 and right wrist with nearly 3 months relief - Patient elected for repeat carpal tunnel CSI.  Tolerated well per note below - May continue carpal tunnel night brace  Procedure: Ultrasound Guided Carpal Tunnel Injection Side: Right Diagnosis: Carpal tunnel [place] Indication:  - accuracy is paramount for diagnosis - to ensure therapeutic efficacy or procedural success - to reduce procedural risk  After explaining the procedure, viable alternatives, risks, and answering any questions, consent was given verbally. The site was cleaned with chlorhexidine prep. An ultrasound transducer was placed on the volar aspect of the wrist.  The median nerve was identified.   An injection was performed under ultrasound guidance from the ulnar approach with care used to avoid ulnar artery.  1ml of 1% lidocaine without epinephrine was used to hydrodissect the median nerve from the retinaculum.  40 mg of triamcinolone (KENALOG) 40mg /ml was then deposited adjacent to but not into the median nerve.  This was well tolerated and resulted in  relief.  Needle was removed and dressing placed and post injection instructions were given including  a discussion of likely return of pain today after the anesthetic wears off (with the possibility of worsened pain) until the steroid starts to work in 1-3 days.   Pt was advised to call or return to clinic if these symptoms worsen or fail to improve as anticipated.   3. Lumbar spine pain 4. Degenerative disc disease at L5-S1 level   -Chronic, stable, subsequent visit - Reviewed patient's x-ray which showed degenerative changes most prominent at L5-S1, though no acute fracture or vertebral collapse - Patient overall has done well after OMT at previous office visit on 08/11/2022  Pertinent previous records reviewed include lumbar x-ray 08/11/2022   Follow Up: As needed for left wrist CSI for carpal tunnel or OMT for low back pain.  Could consider referral to hand surgery for carpal tunnel release   Subjective:   I, Valerie Gilmore, am serving as a Neurosurgeon for Doctor Richardean Sale  Chief Complaint: right wrist pain   HPI:  01/22/2019 Patient responded well to the injection and had near complete resolution of pain almost immediately after the injection. Patient does have significant dilation of the median nerve and will need to consider the possibility of a surgical intervention at some point. Patient at this point would like to avoid it. Patient will increase activity slowly over the course the next several weeks. Follow-up with me again in 4 to 8 weeks    Updated 08/11/2022 Valerie Gilmore is a 52 y.o. female coming in with complaint of B carpal tunnel. Has been seeing Dr. Jean Rosenthal.  Last injections in November 2023. Patient is having wrist pain in both wrists with R>L. At night the tingling will extend into her fingers. Tingling in wrist during the day.    Patient is also complaining of some of the back pain.  Has had back pain previously and seems to be worsening.  Affecting daily activities.  Describes it as a dull, throbbing aching discomfort.   Patient did have carpal  tunnel syndrome diagnosed on nerve conduction study in 2017.  This was moderate to severe even at that time  11/19/22 Patient states she would like a right wrist CSI today   Relevant Historical Information: Hypertension  Additional pertinent review of systems negative.   Current Outpatient Medications:    ibuprofen (IBU) 800 MG tablet, Take 1 tablet  (800 mg total) by mouth every 8 (eight) hours as needed., Disp: 30 tablet, Rfl: 3   ibuprofen (IBU) 800 MG tablet, Take 1 tablet (800 mg total) by mouth every 8 (eight) hours as needed., Disp: 30 tablet, Rfl: 3   metFORMIN (GLUCOPHAGE) 500 MG tablet, Take 1 tablet (500 mg total) by mouth daily with breakfast., Disp: 90 tablet, Rfl: 3   Olmesartan-amLODIPine-HCTZ 40-10-25 MG TABS, Take 1 tablet by mouth daily., Disp: 90 tablet, Rfl: 3   pantoprazole (PROTONIX) 40 MG tablet, TAKE 1 TABLET BY MOUTH ONCE A DAY, Disp: 90 tablet, Rfl: 3   Objective:     Vitals:   11/19/22 1456  Pulse: 73  SpO2: 97%  Weight: 189 lb (85.7 kg)  Height: 5\' 7"  (1.702 m)      Body mass index is 29.6 kg/m.    Physical Exam:    General: Appears well, nad, nontoxic and pleasant Neuro:sensation intact, strength is 5/5 with df/pf/inv/ev, muscle tone wnl Skin:no susupicious lesions or rashes   Bilateral wrist:  No deformity or swelling appreciated. ROM  Ext 90, flexion70, radial/ulnar deviation 30 nttp over the snauff box, dorsal carpals, volar carpals, radial styloid, ulnar styloid, 1st mcp, tfcc Positive Tinel's on right.  Negative on left Negative finklestein Neg tfcc bounce test No pain with resisted ext, flex or deviation     Electronically signed by:  Valerie Gilmore D.[facility] 3:14 PM 11/19/22

## 2022-11-23 NOTE — Progress Notes (Signed)
Valerie [facility] D.[facility] [address] [place] [address] Phone: [phone]   Assessment and Plan:     1. Left wrist pain 2. Carpal tunnel syndrome of left wrist  -Chronic with exacerbation, subsequent visit - Recurrence in left wrist pain with mild radicular symptoms consistent with carpal tunnel - Patient has had significant relief in the past with carpal tunnel CSI with most recent in 12/2021 with 9+ months relief - Patient elected for repeat carpal tunnel CSI.  Tolerated well per note below.  CSI may temporarily increase blood pressure in patient with past medical history of hypertension - May continue carpal tunnel night bracing  Procedure: Ultrasound Guided Carpal Tunnel Injection Side: Left Diagnosis: Carpal tunnel syndrome [place] Indication:  - accuracy is paramount for diagnosis - to ensure therapeutic efficacy or procedural success - to reduce procedural risk  After explaining the procedure, viable alternatives, risks, and answering any questions, consent was given verbally. The site was cleaned with chlorhexidine prep. An ultrasound transducer was placed on the volar aspect of the wrist.  The median nerve was identified.   An injection was performed under ultrasound guidance from the ulnar approach with care used to avoid ulnar artery.  1ml of 1% lidocaine without epinephrine was used to hydrodissect the median nerve from the retinaculum.  40 mg of triamcinolone (KENALOG) 40mg /ml was then deposited adjacent to but not into the median nerve.  This was well tolerated and resulted in  relief.  Needle was removed and dressing placed and post injection instructions were given including  a discussion of likely return of pain today after the anesthetic wears off (with the possibility of worsened pain) until the steroid starts to work in 1-3 days.   Pt was advised to call or return to clinic if these symptoms worsen or fail to improve as  anticipated.   Pertinent previous records reviewed include none   Follow Up: As needed.  Could refer to orthopedic surgery for carpal tunnel release   any time patient desires   Subjective:   I, Valerie Gilmore, am serving as a Neurosurgeon for Doctor Richardean Sale   Chief Complaint: right wrist pain    HPI:  01/22/2019 Patient responded well to the injection and had near complete resolution of pain almost immediately after the injection. Patient does have significant dilation of the median nerve and will need to consider the possibility of a surgical intervention at some point. Patient at this point would like to avoid it. Patient will increase activity slowly over the course the next several weeks. Follow-up with me again in 4 to 8 weeks    Updated 08/11/2022 Valerie Gilmore is a 52 y.o. female coming in with complaint of B carpal tunnel. Has been seeing Dr. Jean Rosenthal.  Last injections in November 2023. Patient is having wrist pain in both wrists with R>L. At night the tingling will extend into her fingers. Tingling in wrist during the day.    Patient is also complaining of some of the back pain.  Has had back pain previously and seems to be worsening.  Affecting daily activities.  Describes it as a dull, throbbing aching discomfort.   Patient did have carpal tunnel syndrome diagnosed on nerve conduction study in 2017.  This was moderate to severe even at that time   11/19/22 Patient states she would like a right wrist CSI today   12/06/2022 Patient states her for left wrist CSI   Relevant Historical Information:  Hypertension  Additional pertinent review of systems negative.   Current Outpatient Medications:    ibuprofen (IBU) 800 MG tablet, Take 1 tablet (800 mg total) by mouth every 8 (eight) hours as needed., Disp: 30 tablet, Rfl: 3   ibuprofen (IBU) 800 MG tablet, Take 1 tablet (800 mg total) by mouth every 8 (eight) hours as needed., Disp: 30 tablet, Rfl: 3   metFORMIN  (GLUCOPHAGE) 500 MG tablet, Take 1 tablet (500 mg total) by mouth daily with breakfast., Disp: 90 tablet, Rfl: 3   Olmesartan-amLODIPine-HCTZ 40-10-25 MG TABS, Take 1 tablet by mouth daily., Disp: 90 tablet, Rfl: 3   pantoprazole (PROTONIX) 40 MG tablet, TAKE 1 TABLET BY MOUTH ONCE A DAY, Disp: 90 tablet, Rfl: 3   Objective:     Vitals:   12/06/22 1347  Pulse: 76  SpO2: 96%  Weight: 188 lb (85.3 kg)  Height: 5\' 7"  (1.702 m)      Body mass index is 29.44 kg/m.    Physical Exam:    General: Appears well, nad, nontoxic and pleasant Neuro:sensation intact, strength is 5/5 with df/pf/inv/ev, muscle tone wnl Skin:no susupicious lesions or rashes   left wrist:  No deformity or swelling appreciated. ROM  Ext 90, flexion70, radial/ulnar deviation 30 nttp over the snauff box, dorsal carpals, volar carpals, radial styloid, ulnar styloid, 1st mcp, tfcc Positive Tinel's on left.  Negative on right Negative finklestein Neg tfcc bounce test No pain with resisted ext, flex or deviation     Electronically signed by:  Valerie [facility] 2:00 PM 12/06/22

## 2022-12-06 ENCOUNTER — Ambulatory Visit: Payer: BC Managed Care – PPO | Admitting: Sports Medicine

## 2022-12-06 ENCOUNTER — Ambulatory Visit: Payer: Self-pay

## 2022-12-06 VITALS — HR 76 | Ht 67.0 in | Wt 188.0 lb

## 2022-12-06 DIAGNOSIS — M25532 Pain in left wrist: Secondary | ICD-10-CM | POA: Diagnosis not present

## 2022-12-06 DIAGNOSIS — G5602 Carpal tunnel syndrome, left upper limb: Secondary | ICD-10-CM

## 2023-01-25 ENCOUNTER — Other Ambulatory Visit (HOSPITAL_COMMUNITY): Payer: Self-pay

## 2023-01-25 ENCOUNTER — Ambulatory Visit: Payer: BC Managed Care – PPO | Admitting: Internal Medicine

## 2023-01-25 ENCOUNTER — Encounter: Payer: Self-pay | Admitting: Internal Medicine

## 2023-01-25 VITALS — BP 120/88 | HR 65 | Temp 98.2°F | Wt 186.2 lb

## 2023-01-25 DIAGNOSIS — I10 Essential (primary) hypertension: Secondary | ICD-10-CM

## 2023-01-25 DIAGNOSIS — J302 Other seasonal allergic rhinitis: Secondary | ICD-10-CM | POA: Diagnosis not present

## 2023-01-25 MED ORDER — FLUTICASONE PROPIONATE 50 MCG/ACT NA SUSP
2.0000 | Freq: Every day | NASAL | 6 refills | Status: AC
Start: 2023-01-25 — End: ?
  Filled 2023-01-25: qty 16, 30d supply, fill #0

## 2023-01-25 NOTE — Progress Notes (Signed)
Established Patient Office Visit     CC/Reason for Visit: Headache, elevated blood pressure readings "ears popping", postnasal drip  HPI: Valerie Gilmore is a 52 y.o. female who is coming in today for the above mentioned reasons.  She has been having some headaches postnasal drip and some left ear popping.  No other URI symptoms.  When she has been having the headache she has noticed some very elevated blood pressure readings in the range of 204/100.   Past Medical/Surgical History: Past Medical History:  Diagnosis Date   Depression    Hypertension    high blood pressure readings    Migraine     Past Surgical History:  Procedure Laterality Date   ABDOMINAL HYSTERECTOMY  2011    Social History:  reports that she has never smoked. She has never used smokeless tobacco. She reports current alcohol use. She reports that she does not use drugs.  Allergies: Allergies  Allergen Reactions   Sulfa Antibiotics Other (See Comments)    Hallucinations    Other Rash    Powder gloves cause a rash    Family History:  Family History  Problem Relation Age of Onset   Breast cancer Paternal Grandmother    Prostate cancer Father    Hypertension Other        both sides      Current Outpatient Medications:    fluticasone (FLONASE) 50 MCG/ACT nasal spray, Place 2 sprays into both nostrils daily., Disp: 16 g, Rfl: 6   ibuprofen (IBU) 800 MG tablet, Take 1 tablet (800 mg total) by mouth every 8 (eight) hours as needed., Disp: 30 tablet, Rfl: 3   ibuprofen (IBU) 800 MG tablet, Take 1 tablet (800 mg total) by mouth every 8 (eight) hours as needed., Disp: 30 tablet, Rfl: 3   metFORMIN (GLUCOPHAGE) 500 MG tablet, Take 1 tablet (500 mg total) by mouth daily with breakfast., Disp: 90 tablet, Rfl: 3   Olmesartan-amLODIPine-HCTZ 40-10-25 MG TABS, Take 1 tablet by mouth daily., Disp: 90 tablet, Rfl: 3   pantoprazole (PROTONIX) 40 MG tablet, TAKE 1 TABLET BY MOUTH ONCE A DAY, Disp: 90 tablet,  Rfl: 3  Review of Systems:  Negative unless indicated in HPI.   Physical Exam: Vitals:   01/25/23 0708  BP: 120/88  Pulse: 65  Temp: 98.2 F (36.8 C)  TempSrc: Oral  SpO2: 98%  Weight: 186 lb 3.2 oz (84.5 kg)    Body mass index is 29.16 kg/m.   Physical Exam Vitals reviewed.  Constitutional:      Appearance: Normal appearance.  HENT:     Right Ear: Tympanic membrane, ear canal and external ear normal.     Left Ear: Ear canal and external ear normal. A middle ear effusion is present.     Mouth/Throat:     Mouth: Mucous membranes are moist.     Pharynx: Oropharynx is clear.  Eyes:     Conjunctiva/sclera: Conjunctivae normal.     Pupils: Pupils are equal, round, and reactive to light.  Cardiovascular:     Rate and Rhythm: Normal rate and regular rhythm.  Pulmonary:     Effort: Pulmonary effort is normal.     Breath sounds: Normal breath sounds.  Neurological:     Mental Status: She is alert.      Impression and Plan:  Seasonal allergies -     Fluticasone Propionate; Place 2 sprays into both nostrils daily.  Dispense: 16 g; Refill: 6  Essential hypertension   -  Blood pressure seems fairly well-controlled today.  Advised her to keep close track of blood pressure measurements at home.  It is possible that spiking blood pressure might be related to severe pain from headache. -I believe her headaches and postnasal drip are related to seasonal allergies.  Have advised daily use of antihistamines and Flonase.  Time spent:32 minutes reviewing chart, interviewing and examining patient and formulating plan of care.     Chaya Jan, MD [facility]

## 2023-02-04 ENCOUNTER — Other Ambulatory Visit (HOSPITAL_COMMUNITY): Payer: Self-pay

## 2023-02-22 ENCOUNTER — Other Ambulatory Visit: Payer: Self-pay | Admitting: Sports Medicine

## 2023-02-22 ENCOUNTER — Other Ambulatory Visit (HOSPITAL_COMMUNITY): Payer: Self-pay

## 2023-02-22 ENCOUNTER — Telehealth: Payer: Self-pay | Admitting: Sports Medicine

## 2023-02-22 MED ORDER — IBUPROFEN 800 MG PO TABS
800.0000 mg | ORAL_TABLET | Freq: Three times a day (TID) | ORAL | 1 refills | Status: DC | PRN
  Filled 2023-02-22: qty 30, 10d supply, fill #0

## 2023-02-22 NOTE — Telephone Encounter (Signed)
 Ibu refilled

## 2023-02-22 NOTE — Telephone Encounter (Signed)
 Pt states at last visit she asked for a refill of her 800mg  ibuprofen, can we fill this to [facility]?

## 2023-02-23 ENCOUNTER — Other Ambulatory Visit (HOSPITAL_COMMUNITY): Payer: Self-pay

## 2023-02-28 ENCOUNTER — Other Ambulatory Visit (HOSPITAL_COMMUNITY): Payer: Self-pay

## 2023-02-28 ENCOUNTER — Encounter: Payer: Self-pay | Admitting: Family Medicine

## 2023-02-28 ENCOUNTER — Ambulatory Visit (INDEPENDENT_AMBULATORY_CARE_PROVIDER_SITE_OTHER): Payer: 59 | Admitting: Family Medicine

## 2023-02-28 VITALS — BP 152/94 | HR 70 | Temp 97.9°F | Wt 192.5 lb

## 2023-02-28 DIAGNOSIS — I10 Essential (primary) hypertension: Secondary | ICD-10-CM | POA: Diagnosis not present

## 2023-02-28 DIAGNOSIS — M549 Dorsalgia, unspecified: Secondary | ICD-10-CM | POA: Diagnosis not present

## 2023-02-28 MED ORDER — METHOCARBAMOL 500 MG PO TABS
500.0000 mg | ORAL_TABLET | Freq: Four times a day (QID) | ORAL | 0 refills | Status: DC | PRN
  Filled 2023-02-28: qty 30, 8d supply, fill #0

## 2023-02-28 MED ORDER — IBUPROFEN 800 MG PO TABS
800.0000 mg | ORAL_TABLET | Freq: Three times a day (TID) | ORAL | 1 refills | Status: DC | PRN
  Filled 2023-02-28 – 2023-06-10 (×2): qty 30, 10d supply, fill #0
  Filled 2023-11-14: qty 30, 10d supply, fill #1

## 2023-02-28 NOTE — Progress Notes (Signed)
Established Patient Office Visit  Subjective   Patient ID: Valerie Gilmore, female    DOB: [birth year]  Age: 53 y.o. MRN: [id number]  Chief Complaint  Patient presents with   Shoulder Pain    Patient complains of left shoulder pain, x4 days, Denies any injury     HPI   Valerie Gilmore is seen with 4-day history of left shoulder pain.  She states she actually woke up with pain in her shoulder region and really more left trapezius and upper back region.  Denies any injury.  She initially thought this may be a "crick "in her neck.  She tried some heat and ice with minimal relief.  Pain worse with neck flexion especially left trapezius region.  She is also had a little bit of sore throat especially left side of throat and cough for past couple days but no fever.  No neck adenopathy.  Denies any radiculitis symptoms.  No upper extremity numbness or weakness.  She has taken ibuprofen 800 mg in the past and was hoping to get that filled  She does have history of hypertension and has not been monitoring her blood pressure recently.  Past Medical History:  Diagnosis Date   Depression    Hypertension    high blood pressure readings    Migraine    Past Surgical History:  Procedure Laterality Date   ABDOMINAL HYSTERECTOMY  2011    reports that she has never smoked. She has never used smokeless tobacco. She reports current alcohol use. She reports that she does not use drugs. family history includes Breast cancer in her paternal grandmother; Hypertension in an other family member; Prostate cancer in her father. Allergies  Allergen Reactions   Sulfa Antibiotics Other (See Comments)    Hallucinations    Other Rash    Powder gloves cause a rash    Review of Systems  Constitutional:  Negative for chills and fever.  HENT:  Positive for sore throat.   Respiratory:  Positive for cough. Negative for shortness of breath.   Cardiovascular:  Negative for chest pain.  Neurological:  Negative for  headaches.      Objective:     BP (!) 152/94 (BP Location: Left Arm, Cuff Size: Normal)   Pulse 70   Temp 97.9 F (36.6 C) (Oral)   Wt 192 lb 8 oz (87.3 kg)   SpO2 99%   BMI 30.15 kg/m  BP Readings from Last 3 Encounters:  02/28/23 (!) 152/94  01/25/23 120/88  08/11/22 118/84   Wt Readings from Last 3 Encounters:  02/28/23 192 lb 8 oz (87.3 kg)  01/25/23 186 lb 3.2 oz (84.5 kg)  12/06/22 188 lb (85.3 kg)      Physical Exam Vitals reviewed.  Constitutional:      General: She is not in acute distress.    Appearance: She is not ill-appearing.  HENT:     Right Ear: Tympanic membrane normal.     Left Ear: Tympanic membrane normal.     Mouth/Throat:     Mouth: Mucous membranes are moist.     Pharynx: Oropharynx is clear. No oropharyngeal exudate or posterior oropharyngeal erythema.     Comments: Mildly enlarged tonsils bilaterally but no exudate and no significant erythema. Cardiovascular:     Rate and Rhythm: Normal rate and regular rhythm.  Pulmonary:     Effort: Pulmonary effort is normal.     Breath sounds: Normal breath sounds. No wheezing or rales.  Musculoskeletal:  Cervical back: Neck supple.     Right lower leg: No edema.     Left lower leg: No edema.     Comments: Good range of motion neck.  Good range of motion left shoulder.  No biceps tenderness.  No acromioclavicular tenderness.  No significant pain with internal or external rotation.  Lymphadenopathy:     Cervical: No cervical adenopathy.  Neurological:     Mental Status: She is alert.      No results found for any visits on 02/28/23.    The 10-year ASCVD risk score (Arnett DK, et al., 2019) is: 6.7%    Assessment & Plan:   4-day history of left upper back and neck pain.  She woke up with pain.  Denies any injury.  Suspect muscular.  Continue heat.  Refill ibuprofen 800 mg every 8 hours as needed short-term.  Also wrote for Robaxin 500 mg nightly.  She is aware this may cause some  sedation.  Follow-up with primary if not improving over the next week or so  We also recommend she recheck her blood pressure over the next week and be in touch if consistently greater than 140/90.  Evelena Peat, MD

## 2023-02-28 NOTE — Patient Instructions (Signed)
Let me or Kandee Keen know if not better in one week with regard to the neck/upper back pain.

## 2023-03-11 ENCOUNTER — Other Ambulatory Visit (HOSPITAL_COMMUNITY): Payer: Self-pay

## 2023-04-09 ENCOUNTER — Other Ambulatory Visit (HOSPITAL_COMMUNITY): Payer: Self-pay

## 2023-04-09 MED ORDER — METHYLPREDNISOLONE 4 MG PO TBPK
ORAL_TABLET | ORAL | 0 refills | Status: DC
Start: 2023-04-09 — End: 2023-04-26
  Filled 2023-04-09: qty 21, 6d supply, fill #0

## 2023-04-09 MED ORDER — BACLOFEN 5 MG PO TABS
5.0000 mg | ORAL_TABLET | Freq: Three times a day (TID) | ORAL | 0 refills | Status: DC
  Filled 2023-04-09: qty 30, 10d supply, fill #0

## 2023-04-14 ENCOUNTER — Other Ambulatory Visit (HOSPITAL_COMMUNITY): Payer: Self-pay

## 2023-04-14 MED ORDER — MELOXICAM 15 MG PO TABS
15.0000 mg | ORAL_TABLET | Freq: Every day | ORAL | 0 refills | Status: DC
  Filled 2023-04-14: qty 30, 30d supply, fill #0

## 2023-04-26 ENCOUNTER — Other Ambulatory Visit (HOSPITAL_COMMUNITY): Payer: Self-pay

## 2023-04-26 MED ORDER — PREDNISONE 5 MG (21) PO TBPK
ORAL_TABLET | ORAL | 0 refills | Status: DC
  Filled 2023-04-26: qty 21, 6d supply, fill #0

## 2023-04-26 MED ORDER — GABAPENTIN 300 MG PO CAPS
ORAL_CAPSULE | ORAL | 0 refills | Status: DC
  Filled 2023-04-26: qty 63, 21d supply, fill #0

## 2023-04-26 MED ORDER — MELOXICAM 15 MG PO TABS
ORAL_TABLET | ORAL | 1 refills | Status: DC
Start: 2023-04-26 — End: 2023-12-22
  Filled 2023-04-26: qty 30, 30d supply, fill #0
  Filled 2023-11-17: qty 30, 30d supply, fill #1

## 2023-06-10 ENCOUNTER — Other Ambulatory Visit: Payer: Self-pay

## 2023-06-10 ENCOUNTER — Other Ambulatory Visit (HOSPITAL_COMMUNITY): Payer: Self-pay

## 2023-06-20 NOTE — Progress Notes (Deleted)
 Valerie Gilmore D.[facility] [address] [place] [address] Phone: [phone]   Assessment and Plan:     There are no diagnoses linked to this encounter.  *** - Patient has received relief with OMT in the past.  Elects for repeat OMT today.  Tolerated well per note below. - Decision today to treat with OMT was based on Physical Exam   After verbal consent patient was treated with HVLA (high velocity low amplitude), ME (muscle energy), FPR (flex positional release), ST (soft tissue), PC/PD (Pelvic Compression/ Pelvic Decompression) techniques in cervical, rib, thoracic, lumbar, and pelvic areas. Patient tolerated the procedure well with improvement in symptoms.  Patient educated on potential side effects of soreness and recommended to rest, hydrate, and use Tylenol  as needed for pain control.   Pertinent previous records reviewed include ***    Follow Up: ***     Subjective:   I, Valerie Gilmore, am serving as a Neurosurgeon for Doctor Valerie Gilmore   Chief Complaint: right wrist pain    HPI:  01/22/2019 Patient responded well to the injection and had near complete resolution of pain almost immediately after the injection. Patient does have significant dilation of the median nerve and will need to consider the possibility of a surgical intervention at some point. Patient at this point would like to avoid it. Patient will increase activity slowly over the course the next several weeks. Follow-up with me again in 4 to 8 weeks    Updated 08/11/2022 Valerie Gilmore is a 53 y.o. female coming in with complaint of B carpal tunnel. Has been seeing Dr. Cleora Gilmore.  Last injections in November 2023. Patient is having wrist pain in both wrists with R>L. At night the tingling will extend into her fingers. Tingling in wrist during the day.    Patient is also complaining of some of the back pain.  Has had back pain previously and seems to be worsening.  Affecting daily  activities.  Describes it as a dull, throbbing aching discomfort.   Patient did have carpal tunnel syndrome diagnosed on nerve conduction study in 2017.  This was moderate to severe even at that time   11/19/22 Patient states she would like a right wrist CSI today    12/06/2022 Patient states her for left wrist CSI  06/21/2023 Patient states   Relevant Historical Information: Hypertension  Additional pertinent review of systems negative.  Current Outpatient Medications  Medication Sig Dispense Refill   Baclofen  5 MG TABS Take 1 tablet (5 mg total) by mouth 3 (three) times daily for 10 days 30 tablet 0   fluticasone  (FLONASE ) 50 MCG/ACT nasal spray Place 2 sprays into both nostrils daily. 16 g 6   gabapentin  (NEURONTIN ) 300 MG capsule Take 1 capsule daily for 3 days, then 1 capsule twice daily for 3 days then take 1 capsule 3 times a day by mouth as needed for 21 days. 63 capsule 0   ibuprofen  (IBU) 800 MG tablet Take 1 tablet (800 mg total) by mouth every 8 (eight) hours as needed. 30 tablet 1   ibuprofen  (IBU) 800 MG tablet Take 1 tablet (800 mg total) by mouth every 8 (eight) hours as needed. 30 tablet 1   meloxicam  (MOBIC ) 15 MG tablet Take 1 tablet (15 mg total) by mouth daily with a meal. 30 tablet 0   meloxicam  (MOBIC ) 15 MG tablet Take 1 tablet every day by oral route as needed for 30 days. 30 tablet  1   metFORMIN  (GLUCOPHAGE ) 500 MG tablet Take 1 tablet (500 mg total) by mouth daily with breakfast. 90 tablet 3   methocarbamol  (ROBAXIN ) 500 MG tablet Take 1 tablet (500 mg total) by mouth every 6 (six) hours as needed for muscle spasms. 30 tablet 0   Olmesartan -amLODIPine -HCTZ 40-10-25 MG TABS Take 1 tablet by mouth daily. 90 tablet 3   pantoprazole  (PROTONIX ) 40 MG tablet TAKE 1 TABLET BY MOUTH ONCE A DAY 90 tablet 3   predniSONE  (STERAPRED UNI-PAK 21 TAB) 5 MG (21) TBPK tablet Take as directed on dosepak 21 tablet 0   No current facility-administered medications for this visit.       Objective:     There were no vitals filed for this visit.    There is no height or weight on file to calculate BMI.    Physical Exam:     General: Well-appearing, cooperative, sitting comfortably in no acute distress.   OMT Physical Exam:  ASIS Compression Test: Positive Right Cervical: TTP paraspinal, *** Rib: Bilateral elevated first rib with TTP Thoracic: TTP paraspinal,*** Lumbar: TTP paraspinal,*** Pelvis: Right anterior innominate  Electronically signed by:  Valerie Gilmore D.[facility] 7:43 AM 06/20/23

## 2023-06-21 ENCOUNTER — Ambulatory Visit: Admitting: Sports Medicine

## 2023-06-22 NOTE — Progress Notes (Unsigned)
 Valerie Gilmore D.[facility] [address] [place] [address] Phone: [phone]   Assessment and Plan:     1. Right wrist pain 2. Carpal tunnel syndrome of right wrist  -Chronic with exacerbation, subsequent visit - Recurrence of right wrist pain with radicular symptoms, consistent with carpal tunnel - Patient has received relief from carpal tunnel CSI in the past and elected for repeat CSI today.   Tolerated well per note below.  CSI may temporarily increase blood glucose in patient with past medical history of prediabetes - Last right carpal tunnel CSI performed on 11/19/2022.  Last left carpal tunnel CSI performed on 12/06/2022.  - Continue carpal tunnel night brace  Procedure: Ultrasound Guided Carpal Tunnel Injection Side: Right Diagnosis: Carpal tunnel syndrome US  Indication:  - accuracy is paramount for diagnosis - to ensure therapeutic efficacy or procedural success - to reduce procedural risk  After explaining the procedure, viable alternatives, risks, and answering any questions, consent was given verbally. The site was cleaned with chlorhexidine prep. An ultrasound transducer was placed on the volar aspect of the wrist.  The median nerve was identified.   An injection was performed under ultrasound guidance from the ulnar approach with care used to avoid ulnar artery.  1ml of 1% lidocaine without epinephrine was used to hydrodissect the median nerve from the retinaculum.  20 mg of triamcinolone  (KENALOG ) 40mg /ml was then deposited adjacent to but not into the median nerve.  This was well tolerated and resulted in  relief.  Needle was removed and dressing placed and post injection instructions were given including  a discussion of likely return of pain today after the anesthetic wears off (with the possibility of worsened pain) until the steroid starts to work in 1-3 days.   Pt was advised to call or return to clinic if these symptoms worsen  or fail to improve as anticipated. Images permanently stored.   Pertinent previous records reviewed include none  Follow Up: As needed for repeat CSI.  Could consider orthopedic hand surgery referral at any time.   Subjective:   I, Valerie Gilmore, am serving as a Neurosurgeon for Doctor Ulysees Gander   Chief Complaint: right wrist pain    HPI:  01/22/2019 Patient responded well to the injection and had near complete resolution of pain almost immediately after the injection. Patient does have significant dilation of the median nerve and will need to consider the possibility of a surgical intervention at some point. Patient at this point would like to avoid it. Patient will increase activity slowly over the course the next several weeks. Follow-up with me again in 4 to 8 weeks    Updated 08/11/2022 Valerie Gilmore is a 53 y.o. female coming in with complaint of B carpal tunnel. Has been seeing Dr. Cleora Daft.  Last injections in November 2023. Patient is having wrist pain in both wrists with R>L. At night the tingling will extend into her fingers. Tingling in wrist during the day.    Patient is also complaining of some of the back pain.  Has had back pain previously and seems to be worsening.  Affecting daily activities.  Describes it as a dull, throbbing aching discomfort.   Patient did have carpal tunnel syndrome diagnosed on nerve conduction study in 2017.  This was moderate to severe even at that time   11/19/22 Patient states she would like a right wrist CSI today    12/06/2022 Patient states her for left wrist  CSI  06/23/2023 Patient states right wrist CSI. Left wrist is okay but flared a little    Relevant Historical Information: Hypertension  Additional pertinent review of systems negative.   Current Outpatient Medications:    Baclofen  5 MG TABS, Take 1 tablet (5 mg total) by mouth 3 (three) times daily for 10 days, Disp: 30 tablet, Rfl: 0   fluticasone  (FLONASE ) 50 MCG/ACT nasal  spray, Place 2 sprays into both nostrils daily., Disp: 16 g, Rfl: 6   gabapentin  (NEURONTIN ) 300 MG capsule, Take 1 capsule daily for 3 days, then 1 capsule twice daily for 3 days then take 1 capsule 3 times a day by mouth as needed for 21 days., Disp: 63 capsule, Rfl: 0   ibuprofen  (IBU) 800 MG tablet, Take 1 tablet (800 mg total) by mouth every 8 (eight) hours as needed., Disp: 30 tablet, Rfl: 1   ibuprofen  (IBU) 800 MG tablet, Take 1 tablet (800 mg total) by mouth every 8 (eight) hours as needed., Disp: 30 tablet, Rfl: 1   meloxicam  (MOBIC ) 15 MG tablet, Take 1 tablet (15 mg total) by mouth daily with a meal., Disp: 30 tablet, Rfl: 0   meloxicam  (MOBIC ) 15 MG tablet, Take 1 tablet every day by oral route as needed for 30 days., Disp: 30 tablet, Rfl: 1   metFORMIN  (GLUCOPHAGE ) 500 MG tablet, Take 1 tablet (500 mg total) by mouth daily with breakfast., Disp: 90 tablet, Rfl: 3   methocarbamol  (ROBAXIN ) 500 MG tablet, Take 1 tablet (500 mg total) by mouth every 6 (six) hours as needed for muscle spasms., Disp: 30 tablet, Rfl: 0   Olmesartan -amLODIPine -HCTZ 40-10-25 MG TABS, Take 1 tablet by mouth daily., Disp: 90 tablet, Rfl: 3   pantoprazole  (PROTONIX ) 40 MG tablet, TAKE 1 TABLET BY MOUTH ONCE A DAY, Disp: 90 tablet, Rfl: 3   predniSONE  (STERAPRED UNI-PAK 21 TAB) 5 MG (21) TBPK tablet, Take as directed on dosepak, Disp: 21 tablet, Rfl: 0   Objective:     Vitals:   06/23/23 1125  BP: 110/80  Pulse: 87  SpO2: 97%  Weight: 189 lb (85.7 kg)  Height: 5\' 7"  (1.702 m)      Body mass index is 29.6 kg/m.    Physical Exam:    General: Appears well, nad, nontoxic and pleasant Neuro:sensation intact, strength is 5/5 with df/pf/inv/ev, muscle tone wnl Skin:no susupicious lesions or rashes   Bilateral wrist:  No deformity or swelling appreciated. ROM  Ext 90, flexion70, radial/ulnar deviation 30 nttp over the snauff box, dorsal carpals, volar carpals, radial styloid, ulnar styloid, 1st mcp,  tfcc Positive Tinel's on right.  Negative on left Negative finklestein Neg tfcc bounce test No pain with resisted ext, flex or deviation    Electronically signed by:  Marshall Skeeter D.[facility] 11:42 AM 06/23/23

## 2023-06-23 ENCOUNTER — Other Ambulatory Visit: Payer: Self-pay

## 2023-06-23 ENCOUNTER — Ambulatory Visit: Admitting: Sports Medicine

## 2023-06-23 VITALS — BP 110/80 | HR 87 | Ht 67.0 in | Wt 189.0 lb

## 2023-06-23 DIAGNOSIS — M25531 Pain in right wrist: Secondary | ICD-10-CM | POA: Diagnosis not present

## 2023-06-23 DIAGNOSIS — G5601 Carpal tunnel syndrome, right upper limb: Secondary | ICD-10-CM | POA: Diagnosis not present

## 2023-11-14 ENCOUNTER — Other Ambulatory Visit: Payer: Self-pay | Admitting: Adult Health

## 2023-11-15 ENCOUNTER — Ambulatory Visit: Admitting: Adult Health

## 2023-11-17 ENCOUNTER — Telehealth: Payer: Self-pay | Admitting: *Deleted

## 2023-11-17 ENCOUNTER — Other Ambulatory Visit: Payer: Self-pay

## 2023-11-17 ENCOUNTER — Other Ambulatory Visit: Payer: Self-pay | Admitting: Adult Health

## 2023-11-17 ENCOUNTER — Other Ambulatory Visit (HOSPITAL_COMMUNITY): Payer: Self-pay

## 2023-11-17 DIAGNOSIS — R7303 Prediabetes: Secondary | ICD-10-CM

## 2023-11-17 MED ORDER — OLMESARTAN-AMLODIPINE-HCTZ 40-10-25 MG PO TABS
1.0000 | ORAL_TABLET | Freq: Every day | ORAL | 0 refills | Status: DC
  Filled 2023-11-17: qty 30, 30d supply, fill #0

## 2023-11-17 NOTE — Telephone Encounter (Signed)
 Medication sent to pharmacy

## 2023-11-17 NOTE — Telephone Encounter (Signed)
 Copied from CRM [id number]. Topic: Clinical - Medication Question >> Nov 17, 2023 11:19 AM Thersia BROCKS wrote: Reason for CRM: Patient called in regarding Olmesartan -amLODIPine -HCTZ 40-10-25 MG TABS , stated NP Darleene stated he will prescribed her 30days until seen but went to pharmacy and wasn't ready would like a callback regarding this

## 2023-11-18 ENCOUNTER — Other Ambulatory Visit (HOSPITAL_COMMUNITY): Payer: Self-pay

## 2023-11-18 MED ORDER — METFORMIN HCL 500 MG PO TABS
500.0000 mg | ORAL_TABLET | Freq: Every day | ORAL | 3 refills | Status: DC
  Filled 2023-11-18: qty 90, 90d supply, fill #0

## 2023-12-22 ENCOUNTER — Ambulatory Visit: Admitting: Adult Health

## 2023-12-22 ENCOUNTER — Encounter: Payer: Self-pay | Admitting: Adult Health

## 2023-12-22 ENCOUNTER — Other Ambulatory Visit (HOSPITAL_COMMUNITY): Payer: Self-pay

## 2023-12-22 ENCOUNTER — Other Ambulatory Visit: Payer: Self-pay

## 2023-12-22 VITALS — BP 130/80 | HR 73 | Temp 98.0°F | Ht 67.0 in | Wt 180.0 lb

## 2023-12-22 DIAGNOSIS — K5900 Constipation, unspecified: Secondary | ICD-10-CM

## 2023-12-22 DIAGNOSIS — Z0000 Encounter for general adult medical examination without abnormal findings: Secondary | ICD-10-CM

## 2023-12-22 DIAGNOSIS — R7303 Prediabetes: Secondary | ICD-10-CM

## 2023-12-22 DIAGNOSIS — J069 Acute upper respiratory infection, unspecified: Secondary | ICD-10-CM

## 2023-12-22 DIAGNOSIS — I10 Essential (primary) hypertension: Secondary | ICD-10-CM | POA: Diagnosis not present

## 2023-12-22 DIAGNOSIS — K219 Gastro-esophageal reflux disease without esophagitis: Secondary | ICD-10-CM

## 2023-12-22 DIAGNOSIS — Z1211 Encounter for screening for malignant neoplasm of colon: Secondary | ICD-10-CM

## 2023-12-22 LAB — COMPREHENSIVE METABOLIC PANEL WITH GFR
ALT: 30 U/L (ref 0–35)
AST: 24 U/L (ref 0–37)
Albumin: 4.4 g/dL (ref 3.5–5.2)
Alkaline Phosphatase: 47 U/L (ref 39–117)
BUN: 16 mg/dL (ref 6–23)
CO2: 27 meq/L (ref 19–32)
Calcium: 9.8 mg/dL (ref 8.4–10.5)
Chloride: 102 meq/L (ref 96–112)
Creatinine, Ser: 0.76 mg/dL (ref 0.40–1.20)
GFR: 91.04 mL/min (ref >60.00)
Glucose, Bld: 112 mg/dL — ABNORMAL HIGH (ref 70–99)
Potassium: 3.7 meq/L (ref 3.5–5.1)
Sodium: 139 meq/L (ref 135–145)
Total Bilirubin: 0.3 mg/dL (ref 0.2–1.2)
Total Protein: 7.7 g/dL (ref 6.0–8.3)

## 2023-12-22 LAB — CBC WITH DIFFERENTIAL/PLATELET
Basophils Absolute: 0 K/uL (ref 0.0–0.1)
Basophils Relative: 0.5 % (ref 0.0–3.0)
Eosinophils Absolute: 0.1 K/uL (ref 0.0–0.7)
Eosinophils Relative: 1.2 % (ref 0.0–5.0)
HCT: 41 % (ref 36.0–46.0)
Hemoglobin: 13.9 g/dL (ref 12.0–15.0)
Lymphocytes Relative: 40.2 % (ref 12.0–46.0)
Lymphs Abs: 2.3 K/uL (ref 0.7–4.0)
MCHC: 33.8 g/dL (ref 30.0–36.0)
MCV: 80.9 fl (ref 78.0–100.0)
Monocytes Absolute: 0.7 K/uL (ref 0.1–1.0)
Monocytes Relative: 12.4 % — ABNORMAL HIGH (ref 3.0–12.0)
Neutro Abs: 2.6 K/uL (ref 1.4–7.7)
Neutrophils Relative %: 45.7 % (ref 43.0–77.0)
Platelets: 360 K/uL (ref 150.0–400.0)
RBC: 5.07 Mil/uL (ref 3.87–5.11)
RDW: 14 % (ref 11.5–15.5)
WBC: 5.7 K/uL (ref 4.0–10.5)

## 2023-12-22 LAB — LIPID PANEL
Cholesterol: 178 mg/dL (ref 0–200)
HDL: 39.2 mg/dL (ref >39.00)
LDL Cholesterol: 89 mg/dL (ref 0–99)
NonHDL: 138.71
Total CHOL/HDL Ratio: 5
Triglycerides: 251 mg/dL — ABNORMAL HIGH (ref 0.0–149.0)
VLDL: 50.2 mg/dL — ABNORMAL HIGH (ref 0.0–40.0)

## 2023-12-22 LAB — TSH: TSH: 1.22 u[IU]/mL (ref 0.35–5.50)

## 2023-12-22 LAB — HEMOGLOBIN A1C: Hgb A1c MFr Bld: 6.1 % (ref 4.6–6.5)

## 2023-12-22 MED ORDER — BENZONATATE 200 MG PO CAPS
200.0000 mg | ORAL_CAPSULE | Freq: Three times a day (TID) | ORAL | 1 refills | Status: AC | PRN
  Filled 2023-12-22: qty 30, 10d supply, fill #0

## 2023-12-22 MED ORDER — OLMESARTAN-AMLODIPINE-HCTZ 40-10-25 MG PO TABS
1.0000 | ORAL_TABLET | Freq: Every day | ORAL | 3 refills | Status: AC
  Filled 2023-12-22: qty 90, 90d supply, fill #0

## 2023-12-22 MED ORDER — LINZESS 145 MCG PO CAPS
145.0000 ug | ORAL_CAPSULE | Freq: Every day | ORAL | 3 refills | Status: AC
  Filled 2023-12-22: qty 90, 90d supply, fill #0

## 2023-12-22 NOTE — Progress Notes (Deleted)
 Subjective:    Patient ID: Cathryne MARLA Ill, female    DOB: [birth year], 53 y.o.   MRN: [id number]  HPI Patient presents for yearly preventative medicine examination. She is a pleasant 53 year old female who  has a past medical history of Depression, Hypertension, and Migraine.  Hypertension - takes Tribenzor 05-19-23 mg daily. She denies side effects of dizziness, lightheadedness, blurred vision, or syncopal episodes.  BP Readings from Last 3 Encounters:  12/22/23 130/80  06/23/23 110/80  02/28/23 (!) 152/94   Pre diabetes - was on Metformin  but this caused nausea so she stopped it.  Lab Results  Component Value Date   HGBA1C 5.7 07/30/2022   HGBA1C 6.3 06/16/2021   HGBA1C 6.3 03/26/2020   GERD - Takes Protonix  40 mg daily - eels controlled.   Constipation - a provider from her job prescribed her Linzess for chronic constipation and she found this to be helpful but the provider that was prescribing her the medication will no longer do it.   Acutely, she reports that over the last two days she has developed loss of voice, nasal congestion and a semi productive cough. She does not have any sinus pain/pressure, fevers, chills, SOB, or wheezing.    All immunizations and health maintenance protocols were reviewed with the patient and needed orders were placed.  Appropriate screening laboratory values were ordered for the patient including screening of hyperlipidemia, renal function and hepatic function.  Medication reconciliation,  past medical history, social history, problem list and allergies were reviewed in detail with the patient  Goals were established with regard to weight loss, exercise, and  diet in compliance with medications  She is due for colon cancer screening and screening mammogram. She has had a partial hysterectomy with her cervix removed.    Review of Systems  Constitutional: Negative.   HENT:  Positive for congestion, rhinorrhea and sore throat. Negative for ear  pain, sinus pressure and sinus pain.   Eyes: Negative.   Respiratory:  Positive for cough.   Cardiovascular: Negative.   Gastrointestinal:  Positive for constipation.  Endocrine: Negative.   Genitourinary: Negative.   Musculoskeletal: Negative.   Skin: Negative.   Allergic/Immunologic: Negative.   Neurological: Negative.   Hematological: Negative.   Psychiatric/Behavioral: Negative.     Past Medical History:  Diagnosis Date   Depression    Hypertension    high blood pressure readings    Migraine     Social History   Socioeconomic History   Marital status: Single    Spouse name: Not on file   Number of children: Not on file   Years of education: Not on file   Highest education level: Not on file  Occupational History   Not on file  Tobacco Use   Smoking status: Never   Smokeless tobacco: Never  Vaping Use   Vaping status: Every Day  Substance and Sexual Activity   Alcohol use: Yes    Comment: rare   Drug use: No   Sexual activity: Not on file  Other Topics Concern   Not on file  Social History Narrative   Right handed   Social Drivers of Health   Financial Resource Strain: Not on file  Food Insecurity: Not on file  Transportation Needs: Not on file  Physical Activity: Not on file  Stress: Not on file  Social Connections: Unknown (06/19/2021)   Received from [facility]   Social Network    Social Network: Not on file  Intimate  Partner Violence: Unknown (05/11/2021)   Received from [facility]   HITS    Physically Hurt: Not on file    Insult or Talk Down To: Not on file    Threaten Physical Harm: Not on file    Scream or Curse: Not on file    Past Surgical History:  Procedure Laterality Date   ABDOMINAL HYSTERECTOMY  2011    Family History  Problem Relation Age of Onset   Breast cancer Paternal Grandmother    Prostate cancer Father    Hypertension Other        both sides     Allergies  Allergen Reactions   Sulfa Antibiotics Other (See  Comments)    Hallucinations    Other Rash    Powder gloves cause a rash    Current Outpatient Medications on File Prior to Visit  Medication Sig Dispense Refill   fluticasone  (FLONASE ) 50 MCG/ACT nasal spray Place 2 sprays into both nostrils daily. 16 g 6   pantoprazole  (PROTONIX ) 40 MG tablet TAKE 1 TABLET BY MOUTH ONCE A DAY 90 tablet 3   No current facility-administered medications on file prior to visit.    BP 130/80   Pulse 73   Temp 98 F (36.7 C) (Oral)   Ht 5' 7 (1.702 m)   Wt 180 lb (81.6 kg)   SpO2 97%   BMI 28.19 kg/m       Objective:   Physical Exam Vitals and nursing note reviewed.  Constitutional:      General: She is not in acute distress.    Appearance: Normal appearance. She is not ill-appearing.  HENT:     Head: Normocephalic and atraumatic.     Right Ear: Tympanic membrane, ear canal and external ear normal. There is no impacted cerumen.     Left Ear: Tympanic membrane, ear canal and external ear normal. There is no impacted cerumen.     Nose: Nose normal. No congestion or rhinorrhea.     Right Turbinates: Not enlarged or swollen.     Left Turbinates: Not enlarged or swollen.     Mouth/Throat:     Mouth: Mucous membranes are moist.     Pharynx: Oropharynx is clear.     Tonsils: No tonsillar exudate.     Comments: Chronic hypertrophic tonsils  + Laryngitis  Eyes:     Extraocular Movements: Extraocular movements intact.     Conjunctiva/sclera: Conjunctivae normal.     Pupils: Pupils are equal, round, and reactive to light.  Neck:     Thyroid : No thyroid  mass or thyromegaly.     Vascular: No carotid bruit.  Cardiovascular:     Rate and Rhythm: Normal rate and regular rhythm.     Pulses: Normal pulses.     Heart sounds: No murmur heard.    No friction rub. No gallop.  Pulmonary:     Effort: Pulmonary effort is normal.     Breath sounds: Normal breath sounds. No wheezing, rhonchi or rales.  Abdominal:     General: Abdomen is flat. Bowel  sounds are normal. There is no distension.     Palpations: Abdomen is soft. There is no mass.     Tenderness: There is no abdominal tenderness. There is no guarding or rebound.     Hernia: No hernia is present.  Musculoskeletal:        General: Normal range of motion.     Cervical back: Normal range of motion and neck supple.  Lymphadenopathy:  Cervical: No cervical adenopathy.  Skin:    General: Skin is warm and dry.     Capillary Refill: Capillary refill takes less than 2 seconds.  Neurological:     General: No focal deficit present.     Mental Status: She is alert and oriented to person, place, and time.  Psychiatric:        Mood and Affect: Mood normal.        Behavior: Behavior normal.        Thought Content: Thought content normal.        Judgment: Judgment normal.       Assessment & Plan:  1. Routine general medical examination at a health care facility (Primary) Today patient counseled on age appropriate routine health concerns for screening and prevention, each reviewed and up to date or declined. Immunizations reviewed and up to date or declined. Labs ordered and reviewed. Risk factors for depression reviewed and negative. Hearing function and visual acuity are intact. ADLs screened and addressed as needed. Functional ability and level of safety reviewed and appropriate. Education, counseling and referrals performed based on assessed risks today. Patient provided with a copy of personalized plan for preventive services. - Follow up in one year or sooner if needed - Eat healthy and exercise   2. Pre-diabetes - Consider adding low dose glipizide or jardiance  - CBC with Differential/Platelet; Future - Comprehensive metabolic panel with GFR; Future - Lipid panel; Future - TSH; Future - Hemoglobin A1c; Future - Hemoglobin A1c - TSH - Lipid panel - Comprehensive metabolic panel with GFR - CBC with Differential/Platelet  3. Essential hypertension - Controlled with  Tribenzor. No change - CBC with Differential/Platelet; Future - Comprehensive metabolic panel with GFR; Future - Lipid panel; Future - TSH; Future - Olmesartan -amLODIPine -HCTZ 40-10-25 MG TABS; Take 1 tablet by mouth daily.  Dispense: 90 tablet; Refill: 3 - TSH - Lipid panel - Comprehensive metabolic panel with GFR - CBC with Differential/Platelet  4. Gastroesophageal reflux disease without esophagitis - Continue PPI  - CBC with Differential/Platelet; Future - Comprehensive metabolic panel with GFR; Future - Lipid panel; Future - TSH; Future - TSH - Lipid panel - Comprehensive metabolic panel with GFR - CBC with Differential/Platelet  5. Colon cancer screening  - Ambulatory referral to Gastroenterology  6. Viral URI - Likely viral URI. Will send in Tessalon pearls for cough. Advised rest, hydration and voice rest. Follow up if not improving in the next 4-5 days  - benzonatate (TESSALON) 200 MG capsule; Take 1 capsule (200 mg total) by mouth 3 (three) times daily as needed.  Dispense: 30 capsule; Refill: 1  7. Constipation, unspecified constipation type  - LINZESS 145 MCG CAPS capsule; Take 1 capsule (145 mcg total) by mouth daily.  Dispense: 90 capsule; Refill: 3  Veena Sturgess, NP

## 2023-12-22 NOTE — Progress Notes (Signed)
 Subjective:    Patient ID: Valerie Gilmore Ill, female    DOB: October 18, 1970, 53 y.o.   MRN: [id number]  HPI Patient presents for yearly preventative medicine examination. She is a pleasant 53 year old female who  has a past medical history of Depression, Hypertension, and Migraine.  Hypertension - takes Tribenzor 05-19-23 mg daily. She denies side effects of dizziness, lightheadedness, blurred vision, or syncopal episodes.  BP Readings from Last 3 Encounters:  12/22/23 130/80  06/23/23 110/80  02/28/23 (!) 152/94   Pre diabetes - was on Metformin  but this caused nausea so she stopped it.  Lab Results  Component Value Date   HGBA1C 5.7 07/30/2022   HGBA1C 6.3 06/16/2021   HGBA1C 6.3 03/26/2020   GERD - Takes Protonix  40 mg daily - eels controlled.   Constipation - a provider from her job prescribed her Linzess for chronic constipation and she found this to be helpful but the provider that was prescribing her the medication will no longer do it.   Acutely, she reports that over the last two days she has developed loss of voice, nasal congestion and a semi productive cough. She does not have any sinus pain/pressure, fevers, chills, SOB, or wheezing.    All immunizations and health maintenance protocols were reviewed with the patient and needed orders were placed.  Appropriate screening laboratory values were ordered for the patient including screening of hyperlipidemia, renal function and hepatic function.  Medication reconciliation,  past medical history, social history, problem list and allergies were reviewed in detail with the patient  Goals were established with regard to weight loss, exercise, and  diet in compliance with medications  She is due for colon cancer screening and screening mammogram. She has had a partial hysterectomy with her cervix removed.    Review of Systems  Constitutional: Negative.   HENT:  Positive for rhinorrhea. Negative for ear pain, postnasal drip, sinus  pressure, sinus pain, sore throat and tinnitus.   Eyes: Negative.   Respiratory:  Positive for cough.   Cardiovascular: Negative.   Gastrointestinal:  Positive for constipation.  Endocrine: Negative.   Genitourinary: Negative.   Musculoskeletal: Negative.   Skin: Negative.   Allergic/Immunologic: Negative.   Neurological: Negative.   Hematological: Negative.   Psychiatric/Behavioral: Negative.     Past Medical History:  Diagnosis Date   Depression    Hypertension    high blood pressure readings    Migraine     Social History   Socioeconomic History   Marital status: Single    Spouse name: Not on file   Number of children: Not on file   Years of education: Not on file   Highest education level: Not on file  Occupational History   Not on file  Tobacco Use   Smoking status: Never   Smokeless tobacco: Never  Vaping Use   Vaping status: Every Day  Substance and Sexual Activity   Alcohol use: Yes    Comment: rare   Drug use: No   Sexual activity: Not on file  Other Topics Concern   Not on file  Social History Narrative   Right handed   Social Drivers of Health   Financial Resource Strain: Not on file  Food Insecurity: Not on file  Transportation Needs: Not on file  Physical Activity: Not on file  Stress: Not on file  Social Connections: Unknown (06/19/2021)   Received from [facility]   Social Network    Social Network: Not on file  Intimate Partner Violence: Unknown (05/11/2021)   Received from [facility]   HITS    Physically Hurt: Not on file    Insult or Talk Down To: Not on file    Threaten Physical Harm: Not on file    Scream or Curse: Not on file    Past Surgical History:  Procedure Laterality Date   ABDOMINAL HYSTERECTOMY  2011    Family History  Problem Relation Age of Onset   Breast cancer Paternal Grandmother    Prostate cancer Father    Hypertension Other        both sides     Allergies  Allergen Reactions   Sulfa Antibiotics  Other (See Comments)    Hallucinations    Other Rash    Powder gloves cause a rash    Current Outpatient Medications on File Prior to Visit  Medication Sig Dispense Refill   fluticasone  (FLONASE ) 50 MCG/ACT nasal spray Place 2 sprays into both nostrils daily. 16 g 6   pantoprazole  (PROTONIX ) 40 MG tablet TAKE 1 TABLET BY MOUTH ONCE A DAY 90 tablet 3   No current facility-administered medications on file prior to visit.    BP 130/80   Pulse 73   Temp 98 F (36.7 C) (Oral)   Ht 5' 7 (1.702 m)   Wt 180 lb (81.6 kg)   SpO2 97%   BMI 28.19 kg/m       Objective:   Physical Exam Vitals and nursing note reviewed.  Constitutional:      General: She is not in acute distress.    Appearance: Normal appearance. She is obese. She is not ill-appearing.  HENT:     Head: Normocephalic and atraumatic.     Right Ear: Tympanic membrane, ear canal and external ear normal. There is no impacted cerumen.     Left Ear: Tympanic membrane, ear canal and external ear normal. There is no impacted cerumen.     Nose: Nose normal. No congestion or rhinorrhea.     Right Turbinates: Not enlarged or swollen.     Left Turbinates: Not enlarged or swollen.     Right Sinus: No maxillary sinus tenderness or frontal sinus tenderness.     Left Sinus: No maxillary sinus tenderness or frontal sinus tenderness.     Mouth/Throat:     Mouth: Mucous membranes are moist.     Pharynx: Oropharynx is clear.     Tonsils: No tonsillar exudate.     Comments: Chronic hypertrophic tonsils  + Hoarse voice  Eyes:     Extraocular Movements: Extraocular movements intact.     Conjunctiva/sclera: Conjunctivae normal.     Pupils: Pupils are equal, round, and reactive to light.  Neck:     Vascular: No carotid bruit.  Cardiovascular:     Rate and Rhythm: Normal rate and regular rhythm.     Pulses: Normal pulses.     Heart sounds: No murmur heard.    No friction rub. No gallop.  Pulmonary:     Effort: Pulmonary effort is  normal.     Breath sounds: Normal breath sounds. No decreased breath sounds, wheezing, rhonchi or rales.  Abdominal:     General: Abdomen is flat. Bowel sounds are normal. There is no distension.     Palpations: Abdomen is soft. There is no mass.     Tenderness: There is no abdominal tenderness. There is no guarding or rebound.     Hernia: No hernia is present.  Musculoskeletal:  General: Normal range of motion.     Cervical back: Normal range of motion and neck supple.  Lymphadenopathy:     Cervical: No cervical adenopathy.  Skin:    General: Skin is warm and dry.     Capillary Refill: Capillary refill takes less than 2 seconds.  Neurological:     General: No focal deficit present.     Mental Status: She is alert and oriented to person, place, and time.  Psychiatric:        Mood and Affect: Mood normal.        Behavior: Behavior normal.        Thought Content: Thought content normal.        Judgment: Judgment normal.           Assessment & Plan:  1. Routine general medical examination at a health care facility (Primary) Today patient counseled on age appropriate routine health concerns for screening and prevention, each reviewed and up to date or declined. Immunizations reviewed and up to date or declined. Labs ordered and reviewed. Risk factors for depression reviewed and negative. Hearing function and visual acuity are intact. ADLs screened and addressed as needed. Functional ability and level of safety reviewed and appropriate. Education, counseling and referrals performed based on assessed risks today. Patient provided with a copy of personalized plan for preventive services. - Follow up in one year or sooner if needed - Eat healthy and exercise    2. Pre-diabetes - Consider adding low dose glipizide or jardiance  - CBC with Differential/Platelet; Future - Comprehensive metabolic panel with GFR; Future - Lipid panel; Future - TSH; Future - Hemoglobin A1c;  Future - Hemoglobin A1c - TSH - Lipid panel - Comprehensive metabolic panel with GFR - CBC with Differential/Platelet   3. Essential hypertension - Controlled with Tribenzor. No change - CBC with Differential/Platelet; Future - Comprehensive metabolic panel with GFR; Future - Lipid panel; Future - TSH; Future - Olmesartan -amLODIPine -HCTZ 40-10-25 MG TABS; Take 1 tablet by mouth daily.  Dispense: 90 tablet; Refill: 3 - TSH - Lipid panel - Comprehensive metabolic panel with GFR - CBC with Differential/Platelet   4. Gastroesophageal reflux disease without esophagitis - Continue PPI  - CBC with Differential/Platelet; Future - Comprehensive metabolic panel with GFR; Future - Lipid panel; Future - TSH; Future - TSH - Lipid panel - Comprehensive metabolic panel with GFR - CBC with Differential/Platelet   5. Colon cancer screening   - Ambulatory referral to Gastroenterology   6. Viral URI - Likely viral URI. Will send in Tessalon pearls for cough. Advised rest, hydration and voice rest. Follow up if not improving in the next 4-5 days  - benzonatate (TESSALON) 200 MG capsule; Take 1 capsule (200 mg total) by mouth 3 (three) times daily as needed.  Dispense: 30 capsule; Refill: 1   7. Constipation, unspecified constipation type   - LINZESS 145 MCG CAPS capsule; Take 1 capsule (145 mcg total) by mouth daily.  Dispense: 90 capsule; Refill: 3  Valerie Broski, NP

## 2023-12-23 ENCOUNTER — Other Ambulatory Visit (HOSPITAL_COMMUNITY): Payer: Self-pay

## 2023-12-23 ENCOUNTER — Ambulatory Visit: Payer: Self-pay | Admitting: Adult Health

## 2023-12-23 DIAGNOSIS — R7303 Prediabetes: Secondary | ICD-10-CM

## 2023-12-23 MED ORDER — GLIPIZIDE ER 2.5 MG PO TB24
2.5000 mg | ORAL_TABLET | Freq: Every day | ORAL | 3 refills | Status: AC
  Filled 2023-12-23: qty 90, 90d supply, fill #0

## 2024-01-10 NOTE — Progress Notes (Unsigned)
 Ben Tyresha Fede D.[facility] [address] [place] [address] Phone: [phone]   Assessment and Plan:     1. Right wrist pain 2. Carpal tunnel syndrome of right wrist 3.  Left wrist pain 4.  Carpal tunnel syndrome of left wrist -Chronic with exacerbation, subsequent visit - Recurrence of bilateral wrist pain with radicular symptoms, consistent with carpal tunnel - Patient has received relief from carpal tunnel CSI in the past and elected for repeat CSI today.   Tolerated well per note below.  CSI may temporarily increase blood glucose in patient with past medical history of prediabetes - Last right carpal tunnel CSI performed on 06/23/2023.  Last left carpal tunnel CSI performed on 12/06/2022.  - Continue carpal tunnel night brace   Procedure: Ultrasound Guided Carpal Tunnel Injection Side: Bilateral Diagnosis: Carpal tunnel syndrome US  Indication:  - accuracy is paramount for diagnosis - to ensure therapeutic efficacy or procedural success - to reduce procedural risk   After explaining the procedure, viable alternatives, risks, and answering any questions, consent was given verbally. The site was cleaned with chlorhexidine prep. An ultrasound transducer was placed on the volar aspect of the wrist.  The median nerve was identified.   An injection was performed under ultrasound guidance from the ulnar approach with care used to avoid ulnar artery.  1ml of 1% lidocaine without epinephrine was used to hydrodissect the median nerve from the retinaculum.  20 mg of triamcinolone  (KENALOG ) 40mg /ml was then deposited adjacent to but not into the median nerve.  This was well tolerated and resulted in  relief.  Needle was removed and dressing placed and post injection instructions were given including  a discussion of likely return of pain today after the anesthetic wears off (with the possibility of worsened pain) until the steroid starts to work in 1-3  days.  Procedure was repeated on contralateral side pt was advised to call or return to clinic if these symptoms worsen or fail to improve as anticipated. Images permanently stored.   5.  Chronic low back pain bilaterally with left-sided radicular symptoms -Chronic with exacerbation, subsequent visit - Recurrence of low back pain with intermittent radicular symptoms into left lower extremity - Start HEP for low back - Use ibuprofen  800 mg 3 times daily as needed for breakthrough pain.  Recommend limiting chronic NSAIDs to 1-2 doses per week to prevent long-term side effects. Use Tylenol  500 to 1000 mg tablets 2-3 times a day as needed for day-to-day pain relief.    -Patient has required epidural CSI in the past with significant relief.  Recommend further evaluation with lumbar MRI due to recurrent pain despite >6 weeks of conservative therapy, pain with day-to-day activities, pain at times >6/10, recent lumbar x-ray on file  15 additional minutes spent for educating Therapeutic Home Exercise Program.  This included exercises focusing on stretching, strengthening, with focus on eccentric aspects.   Long term goals include an improvement in range of motion, strength, endurance as well as avoiding reinjury. Patient's frequency would include in 1-2 times a day, 3-5 times a week for a duration of 6-12 weeks. Proper technique shown and discussed handout in great detail with ATC.  All questions were discussed and answered.    Pertinent previous records reviewed include none   Follow Up: 1 week after lumbar MRI to review results and discuss treatment plan.  Could consider epidural CSI if appropriate.  Could consider OMT  As needed for repeat carpal tunnel  CSI.  Could consider orthopedic hand surgery referral at any time.      Subjective:    Chief Complaint: bilateral wrist pain   HPI:   01/22/2019 Patient responded well to the injection and had near complete resolution of pain almost immediately  after the injection. Patient does have significant dilation of the median nerve and will need to consider the possibility of a surgical intervention at some point. Patient at this point would like to avoid it. Patient will increase activity slowly over the course the next several weeks. Follow-up with me again in 4 to 8 weeks    Updated 08/11/2022 Valerie Gilmore is a 53 y.o. female coming in with complaint of B carpal tunnel. Has been seeing Dr. Leonce.  Last injections in November 2023. Patient is having wrist pain in both wrists with R>L. At night the tingling will extend into her fingers. Tingling in wrist during the day.    Patient is also complaining of some of the back pain.  Has had back pain previously and seems to be worsening.  Affecting daily activities.  Describes it as a dull, throbbing aching discomfort.   Patient did have carpal tunnel syndrome diagnosed on nerve conduction study in 2017.  This was moderate to severe even at that time   11/19/22 Patient states she would like a right wrist CSI today    12/06/2022 Patient states her for left wrist CSI   06/23/2023 Patient states right wrist CSI. Left wrist is okay but flared a little   01/11/2024 Today patient states having bilateral wrist pain left is worse than the right and would like an injection. Wrist pain has been intermittent for a couple of weeks. Low back and side pain intermittent for a couple of months. Noticed when she was wearing a waist trainer her pain would flare and something in her back feels shifted. Hx of epidurals and those helped.   Relevant Historical Information: hypertension   Additional pertinent review of systems negative.   Current Outpatient Medications:    ibuprofen  (ADVIL ) 800 MG tablet, Take 1 tablet (800 mg total) by mouth 3 (three) times daily as needed., Disp: 90 tablet, Rfl: 0   benzonatate  (TESSALON ) 200 MG capsule, Take 1 capsule (200 mg total) by mouth 3 (three) times daily as needed.,  Disp: 30 capsule, Rfl: 1   fluticasone  (FLONASE ) 50 MCG/ACT nasal spray, Place 2 sprays into both nostrils daily., Disp: 16 g, Rfl: 6   glipiZIDE  (GLUCOTROL  XL) 2.5 MG 24 hr tablet, Take 1 tablet (2.5 mg total) by mouth daily with breakfast., Disp: 90 tablet, Rfl: 3   LINZESS  145 MCG CAPS capsule, Take 1 capsule (145 mcg total) by mouth daily., Disp: 90 capsule, Rfl: 3   Olmesartan -amLODIPine -HCTZ 40-10-25 MG TABS, Take 1 tablet by mouth daily., Disp: 90 tablet, Rfl: 3   pantoprazole  (PROTONIX ) 40 MG tablet, TAKE 1 TABLET BY MOUTH ONCE A DAY, Disp: 90 tablet, Rfl: 3   Objective:     Vitals:   01/11/24 0927  BP: 120/82  Pulse: 69  SpO2: 99%  Weight: 183 lb (83 kg)  Height: 5' 7 (1.702 m)      Body mass index is 28.66 kg/m.    Physical Exam:    General: Appears well, nad, nontoxic and pleasant Neuro:sensation intact, strength is 5/5 with df/pf/inv/ev, muscle tone wnl Skin:no susupicious lesions or rashes   Bilateral wrist:  No deformity or swelling appreciated. ROM  Ext 90, flexion70, radial/ulnar deviation 30 nttp over  the snauff box, dorsal carpals, volar carpals, radial styloid, ulnar styloid, 1st mcp, tfcc Positive Tinel's on right.  Negative on left Negative finklestein Neg tfcc bounce test No pain with resisted ext, flex or deviation     Back - Normal skin, Spine with normal alignment and no deformity.     tenderness to lumbar vertebral process palpation.   Bilateral lumbar paraspinous muscles are   tender and without spasm NTTP gluteal musculature Straight leg raise negative  Gait normal   Electronically signed by:  Odis Mace D.[facility] 9:51 AM 01/11/24

## 2024-01-11 ENCOUNTER — Ambulatory Visit: Admitting: Sports Medicine

## 2024-01-11 ENCOUNTER — Other Ambulatory Visit (HOSPITAL_COMMUNITY): Payer: Self-pay

## 2024-01-11 ENCOUNTER — Other Ambulatory Visit: Payer: Self-pay

## 2024-01-11 VITALS — BP 120/82 | HR 69 | Ht 67.0 in | Wt 183.0 lb

## 2024-01-11 DIAGNOSIS — G5602 Carpal tunnel syndrome, left upper limb: Secondary | ICD-10-CM

## 2024-01-11 DIAGNOSIS — G5601 Carpal tunnel syndrome, right upper limb: Secondary | ICD-10-CM

## 2024-01-11 DIAGNOSIS — M25531 Pain in right wrist: Secondary | ICD-10-CM

## 2024-01-11 DIAGNOSIS — M25532 Pain in left wrist: Secondary | ICD-10-CM

## 2024-01-11 DIAGNOSIS — G8929 Other chronic pain: Secondary | ICD-10-CM

## 2024-01-11 MED ORDER — IBUPROFEN 800 MG PO TABS
800.0000 mg | ORAL_TABLET | Freq: Three times a day (TID) | ORAL | 0 refills | Status: AC | PRN
  Filled 2024-01-11: qty 30, 10d supply, fill #0
  Filled 2024-01-11: qty 60, 20d supply, fill #0

## 2024-01-11 NOTE — Patient Instructions (Addendum)
 Low back HEP   Ibuprofen  800 mg 3x daily as needed   MRI referral   Follow up 1 week after to discuss images

## 2024-01-13 ENCOUNTER — Ambulatory Visit: Admitting: Sports Medicine

## 2024-02-15 ENCOUNTER — Telehealth: Payer: Self-pay | Admitting: Sports Medicine

## 2024-02-15 NOTE — Telephone Encounter (Signed)
 Patient called and stated that the tips of her thumb and pointer finger have been numb since she came and got her injection. She states it has not happened before for this long and she has been trying to see if it would wear off. She would like to know if that is typical or does she need to come in. Please advise.

## 2024-02-16 NOTE — Telephone Encounter (Signed)
 Messaged patient via mychart
